# Patient Record
Sex: Female | Born: 1965 | Race: Black or African American | Hispanic: No | Marital: Single | Smoking: Never smoker
Health system: Southern US, Community
[De-identification: ages and names within clinical notes are randomized; demographics above are authoritative.]

## PROBLEM LIST (undated history)

## (undated) DIAGNOSIS — C801 Malignant (primary) neoplasm, unspecified: Secondary | ICD-10-CM

## (undated) DIAGNOSIS — T8859XA Other complications of anesthesia, initial encounter: Secondary | ICD-10-CM

## (undated) DIAGNOSIS — E785 Hyperlipidemia, unspecified: Secondary | ICD-10-CM

## (undated) DIAGNOSIS — K589 Irritable bowel syndrome without diarrhea: Secondary | ICD-10-CM

## (undated) DIAGNOSIS — R0989 Other specified symptoms and signs involving the circulatory and respiratory systems: Secondary | ICD-10-CM

## (undated) DIAGNOSIS — I10 Essential (primary) hypertension: Secondary | ICD-10-CM

## (undated) DIAGNOSIS — N189 Chronic kidney disease, unspecified: Secondary | ICD-10-CM

## (undated) DIAGNOSIS — T4145XA Adverse effect of unspecified anesthetic, initial encounter: Secondary | ICD-10-CM

## (undated) DIAGNOSIS — Z9889 Other specified postprocedural states: Secondary | ICD-10-CM

## (undated) DIAGNOSIS — L509 Urticaria, unspecified: Secondary | ICD-10-CM

## (undated) DIAGNOSIS — R112 Nausea with vomiting, unspecified: Secondary | ICD-10-CM

## (undated) DIAGNOSIS — Z973 Presence of spectacles and contact lenses: Secondary | ICD-10-CM

## (undated) HISTORY — DX: Presence of spectacles and contact lenses: Z97.3

## (undated) HISTORY — DX: Urticaria, unspecified: L50.9

## (undated) HISTORY — DX: Hyperlipidemia, unspecified: E78.5

## (undated) HISTORY — DX: Essential (primary) hypertension: I10

## (undated) HISTORY — DX: Morbid (severe) obesity due to excess calories: E66.01

## (undated) HISTORY — PX: ABDOMINAL HYSTERECTOMY: SHX81

## (undated) HISTORY — PX: HEMORROIDECTOMY: SUR656

## (undated) HISTORY — DX: Irritable bowel syndrome, unspecified: K58.9

## (undated) HISTORY — DX: Other specified symptoms and signs involving the circulatory and respiratory systems: R09.89

---

## 1988-03-05 HISTORY — PX: CHOLECYSTECTOMY: SHX55

## 1997-09-02 ENCOUNTER — Encounter: Admission: RE | Admit: 1997-09-02 | Discharge: 1997-09-02 | Payer: Self-pay | Admitting: Family Medicine

## 1997-09-17 ENCOUNTER — Encounter: Admission: RE | Admit: 1997-09-17 | Discharge: 1997-09-17 | Payer: Self-pay | Admitting: Family Medicine

## 1997-11-09 ENCOUNTER — Encounter: Admission: RE | Admit: 1997-11-09 | Discharge: 1997-11-09 | Payer: Self-pay | Admitting: Sports Medicine

## 1998-05-27 ENCOUNTER — Encounter: Admission: RE | Admit: 1998-05-27 | Discharge: 1998-05-27 | Payer: Self-pay | Admitting: Family Medicine

## 1998-09-26 ENCOUNTER — Emergency Department (HOSPITAL_COMMUNITY): Admission: EM | Admit: 1998-09-26 | Discharge: 1998-09-26 | Payer: Self-pay | Admitting: Emergency Medicine

## 1998-10-05 ENCOUNTER — Encounter: Admission: RE | Admit: 1998-10-05 | Discharge: 1998-10-05 | Payer: Self-pay | Admitting: Family Medicine

## 1998-11-02 ENCOUNTER — Encounter: Admission: RE | Admit: 1998-11-02 | Discharge: 1998-11-02 | Payer: Self-pay | Admitting: Family Medicine

## 1998-12-02 ENCOUNTER — Encounter: Admission: RE | Admit: 1998-12-02 | Discharge: 1998-12-02 | Payer: Self-pay | Admitting: Family Medicine

## 1998-12-22 ENCOUNTER — Encounter: Payer: Self-pay | Admitting: Surgery

## 1998-12-22 ENCOUNTER — Encounter: Admission: RE | Admit: 1998-12-22 | Discharge: 1998-12-22 | Payer: Self-pay | Admitting: Surgery

## 1999-01-19 ENCOUNTER — Encounter: Admission: RE | Admit: 1999-01-19 | Discharge: 1999-01-19 | Payer: Self-pay | Admitting: Family Medicine

## 1999-02-09 ENCOUNTER — Encounter: Admission: RE | Admit: 1999-02-09 | Discharge: 1999-02-09 | Payer: Self-pay | Admitting: Family Medicine

## 1999-03-20 ENCOUNTER — Encounter: Admission: RE | Admit: 1999-03-20 | Discharge: 1999-03-20 | Payer: Self-pay | Admitting: Family Medicine

## 1999-03-23 ENCOUNTER — Ambulatory Visit (HOSPITAL_COMMUNITY): Admission: RE | Admit: 1999-03-23 | Discharge: 1999-03-23 | Payer: Self-pay | Admitting: Family Medicine

## 1999-03-23 ENCOUNTER — Encounter: Admission: RE | Admit: 1999-03-23 | Discharge: 1999-03-23 | Payer: Self-pay | Admitting: Family Medicine

## 1999-04-07 ENCOUNTER — Encounter: Admission: RE | Admit: 1999-04-07 | Discharge: 1999-04-07 | Payer: Self-pay | Admitting: Family Medicine

## 1999-09-25 ENCOUNTER — Encounter: Admission: RE | Admit: 1999-09-25 | Discharge: 1999-09-25 | Payer: Self-pay | Admitting: Family Medicine

## 1999-09-28 ENCOUNTER — Encounter: Admission: RE | Admit: 1999-09-28 | Discharge: 1999-09-28 | Payer: Self-pay | Admitting: Family Medicine

## 2000-05-15 ENCOUNTER — Encounter: Admission: RE | Admit: 2000-05-15 | Discharge: 2000-05-15 | Payer: Self-pay | Admitting: Family Medicine

## 2000-09-02 ENCOUNTER — Encounter: Admission: RE | Admit: 2000-09-02 | Discharge: 2000-09-02 | Payer: Self-pay | Admitting: Family Medicine

## 2000-10-16 ENCOUNTER — Other Ambulatory Visit: Admission: RE | Admit: 2000-10-16 | Discharge: 2000-10-16 | Payer: Self-pay | Admitting: Gastroenterology

## 2000-10-16 ENCOUNTER — Encounter (INDEPENDENT_AMBULATORY_CARE_PROVIDER_SITE_OTHER): Payer: Self-pay | Admitting: Specialist

## 2001-02-07 ENCOUNTER — Encounter: Admission: RE | Admit: 2001-02-07 | Discharge: 2001-02-07 | Payer: Self-pay | Admitting: Family Medicine

## 2001-09-15 ENCOUNTER — Encounter: Admission: RE | Admit: 2001-09-15 | Discharge: 2001-09-15 | Payer: Self-pay | Admitting: Family Medicine

## 2001-09-19 ENCOUNTER — Encounter: Admission: RE | Admit: 2001-09-19 | Discharge: 2001-09-19 | Payer: Self-pay | Admitting: Family Medicine

## 2001-12-15 ENCOUNTER — Encounter: Admission: RE | Admit: 2001-12-15 | Discharge: 2001-12-15 | Payer: Self-pay | Admitting: Family Medicine

## 2002-04-17 ENCOUNTER — Encounter: Admission: RE | Admit: 2002-04-17 | Discharge: 2002-04-17 | Payer: Self-pay | Admitting: Family Medicine

## 2002-10-23 ENCOUNTER — Encounter: Admission: RE | Admit: 2002-10-23 | Discharge: 2002-10-23 | Payer: Self-pay | Admitting: Family Medicine

## 2003-08-23 ENCOUNTER — Emergency Department (HOSPITAL_COMMUNITY): Admission: EM | Admit: 2003-08-23 | Discharge: 2003-08-23 | Payer: Self-pay | Admitting: *Deleted

## 2003-10-04 ENCOUNTER — Encounter: Admission: RE | Admit: 2003-10-04 | Discharge: 2003-10-04 | Payer: Self-pay | Admitting: Sports Medicine

## 2003-10-06 ENCOUNTER — Encounter: Admission: RE | Admit: 2003-10-06 | Discharge: 2003-10-06 | Payer: Self-pay | Admitting: Sports Medicine

## 2003-11-16 ENCOUNTER — Ambulatory Visit: Payer: Self-pay | Admitting: Family Medicine

## 2004-02-10 ENCOUNTER — Emergency Department (HOSPITAL_COMMUNITY): Admission: EM | Admit: 2004-02-10 | Discharge: 2004-02-10 | Payer: Self-pay | Admitting: Family Medicine

## 2004-10-06 ENCOUNTER — Encounter: Admission: RE | Admit: 2004-10-06 | Discharge: 2004-10-06 | Payer: Self-pay | Admitting: Sports Medicine

## 2004-11-03 ENCOUNTER — Ambulatory Visit: Payer: Self-pay | Admitting: Sports Medicine

## 2004-11-17 ENCOUNTER — Ambulatory Visit: Payer: Self-pay | Admitting: Family Medicine

## 2004-12-03 ENCOUNTER — Encounter (INDEPENDENT_AMBULATORY_CARE_PROVIDER_SITE_OTHER): Payer: Self-pay | Admitting: *Deleted

## 2004-12-03 LAB — CONVERTED CEMR LAB

## 2004-12-06 ENCOUNTER — Ambulatory Visit: Payer: Self-pay | Admitting: Family Medicine

## 2004-12-06 ENCOUNTER — Encounter (INDEPENDENT_AMBULATORY_CARE_PROVIDER_SITE_OTHER): Payer: Self-pay | Admitting: *Deleted

## 2004-12-07 ENCOUNTER — Encounter: Admission: RE | Admit: 2004-12-07 | Discharge: 2004-12-07 | Payer: Self-pay | Admitting: Sports Medicine

## 2005-03-05 DIAGNOSIS — C801 Malignant (primary) neoplasm, unspecified: Secondary | ICD-10-CM

## 2005-03-05 HISTORY — DX: Malignant (primary) neoplasm, unspecified: C80.1

## 2005-03-26 ENCOUNTER — Ambulatory Visit: Payer: Self-pay | Admitting: Sports Medicine

## 2005-03-31 ENCOUNTER — Encounter: Admission: RE | Admit: 2005-03-31 | Discharge: 2005-03-31 | Payer: Self-pay | Admitting: Sports Medicine

## 2005-04-13 ENCOUNTER — Ambulatory Visit: Payer: Self-pay | Admitting: Family Medicine

## 2005-04-19 ENCOUNTER — Ambulatory Visit (HOSPITAL_COMMUNITY): Admission: RE | Admit: 2005-04-19 | Discharge: 2005-04-19 | Payer: Self-pay | Admitting: Sports Medicine

## 2005-07-18 ENCOUNTER — Ambulatory Visit: Payer: Self-pay | Admitting: Obstetrics & Gynecology

## 2005-08-30 ENCOUNTER — Emergency Department (HOSPITAL_COMMUNITY): Admission: EM | Admit: 2005-08-30 | Discharge: 2005-08-30 | Payer: Self-pay | Admitting: Family Medicine

## 2005-10-08 ENCOUNTER — Encounter: Admission: RE | Admit: 2005-10-08 | Discharge: 2005-10-08 | Payer: Self-pay | Admitting: Sports Medicine

## 2005-10-09 ENCOUNTER — Encounter (INDEPENDENT_AMBULATORY_CARE_PROVIDER_SITE_OTHER): Payer: Self-pay | Admitting: *Deleted

## 2005-10-09 ENCOUNTER — Inpatient Hospital Stay (HOSPITAL_COMMUNITY): Admission: RE | Admit: 2005-10-09 | Discharge: 2005-10-11 | Payer: Self-pay | Admitting: Obstetrics & Gynecology

## 2005-10-09 ENCOUNTER — Ambulatory Visit: Payer: Self-pay | Admitting: Obstetrics & Gynecology

## 2005-10-14 ENCOUNTER — Inpatient Hospital Stay (HOSPITAL_COMMUNITY): Admission: AD | Admit: 2005-10-14 | Discharge: 2005-10-14 | Payer: Self-pay | Admitting: Obstetrics and Gynecology

## 2005-10-25 ENCOUNTER — Ambulatory Visit: Payer: Self-pay | Admitting: Obstetrics and Gynecology

## 2005-11-08 ENCOUNTER — Ambulatory Visit: Payer: Self-pay | Admitting: Obstetrics & Gynecology

## 2005-11-28 ENCOUNTER — Ambulatory Visit: Payer: Self-pay | Admitting: Obstetrics & Gynecology

## 2005-11-28 ENCOUNTER — Inpatient Hospital Stay (HOSPITAL_COMMUNITY): Admission: AD | Admit: 2005-11-28 | Discharge: 2005-11-28 | Payer: Self-pay | Admitting: Obstetrics & Gynecology

## 2005-12-04 ENCOUNTER — Ambulatory Visit: Admission: RE | Admit: 2005-12-04 | Discharge: 2005-12-04 | Payer: Self-pay | Admitting: Gynecologic Oncology

## 2005-12-05 ENCOUNTER — Ambulatory Visit: Payer: Self-pay | Admitting: Obstetrics & Gynecology

## 2005-12-18 ENCOUNTER — Ambulatory Visit: Payer: Self-pay | Admitting: Gastroenterology

## 2005-12-18 LAB — CONVERTED CEMR LAB
AST: 19 units/L (ref 0–37)
BUN: 6 mg/dL (ref 6–23)
CO2: 29 meq/L (ref 19–32)
Calcium: 9 mg/dL (ref 8.4–10.5)
Chloride: 102 meq/L (ref 96–112)
Creatinine, Ser: 0.7 mg/dL (ref 0.4–1.2)
Eosinophil percent: 0.6 % (ref 0.0–5.0)
Folate: 8.2 ng/mL
Glomerular Filtration Rate, Af Am: 119 mL/min/{1.73_m2}
HCT: 34.4 % — ABNORMAL LOW (ref 36.0–46.0)
Hemoglobin: 11.6 g/dL — ABNORMAL LOW (ref 12.0–15.0)
Iron: 41 ug/dL — ABNORMAL LOW (ref 42–145)
MCV: 84.2 fL (ref 78.0–100.0)
Monocytes Absolute: 0.5 10*3/uL (ref 0.2–0.7)
Sodium: 139 meq/L (ref 135–145)
TSH: 1.39 microintl units/mL (ref 0.35–5.50)
Total Protein: 7.1 g/dL (ref 6.0–8.3)
Vitamin B-12: 289 pg/mL (ref 211–911)
WBC: 8.8 10*3/uL (ref 4.5–10.5)

## 2006-01-04 ENCOUNTER — Ambulatory Visit: Payer: Self-pay | Admitting: Gastroenterology

## 2006-01-04 LAB — CONVERTED CEMR LAB: Potassium: 3.2 meq/L — ABNORMAL LOW (ref 3.5–5.1)

## 2006-02-05 ENCOUNTER — Ambulatory Visit: Payer: Self-pay | Admitting: Gastroenterology

## 2006-02-12 ENCOUNTER — Ambulatory Visit: Payer: Self-pay | Admitting: Obstetrics & Gynecology

## 2006-02-12 ENCOUNTER — Encounter (INDEPENDENT_AMBULATORY_CARE_PROVIDER_SITE_OTHER): Payer: Self-pay | Admitting: *Deleted

## 2006-02-12 ENCOUNTER — Inpatient Hospital Stay (HOSPITAL_COMMUNITY): Admission: RE | Admit: 2006-02-12 | Discharge: 2006-02-14 | Payer: Self-pay | Admitting: Obstetrics & Gynecology

## 2006-02-18 ENCOUNTER — Ambulatory Visit: Payer: Self-pay | Admitting: *Deleted

## 2006-03-07 ENCOUNTER — Ambulatory Visit: Payer: Self-pay | Admitting: Obstetrics and Gynecology

## 2006-03-20 ENCOUNTER — Ambulatory Visit: Payer: Self-pay | Admitting: Obstetrics & Gynecology

## 2006-04-01 ENCOUNTER — Ambulatory Visit: Payer: Self-pay | Admitting: Family Medicine

## 2006-04-03 ENCOUNTER — Ambulatory Visit: Payer: Self-pay | Admitting: Family Medicine

## 2006-04-04 ENCOUNTER — Encounter: Admission: RE | Admit: 2006-04-04 | Discharge: 2006-04-04 | Payer: Self-pay | Admitting: Sports Medicine

## 2006-04-17 ENCOUNTER — Ambulatory Visit: Payer: Self-pay | Admitting: Sports Medicine

## 2006-05-02 DIAGNOSIS — I10 Essential (primary) hypertension: Secondary | ICD-10-CM | POA: Insufficient documentation

## 2006-05-02 DIAGNOSIS — E669 Obesity, unspecified: Secondary | ICD-10-CM | POA: Insufficient documentation

## 2006-05-03 ENCOUNTER — Encounter (INDEPENDENT_AMBULATORY_CARE_PROVIDER_SITE_OTHER): Payer: Self-pay | Admitting: *Deleted

## 2006-05-21 ENCOUNTER — Telehealth (INDEPENDENT_AMBULATORY_CARE_PROVIDER_SITE_OTHER): Payer: Self-pay | Admitting: *Deleted

## 2006-05-29 ENCOUNTER — Ambulatory Visit: Payer: Self-pay | Admitting: Sports Medicine

## 2006-06-14 ENCOUNTER — Encounter: Admission: RE | Admit: 2006-06-14 | Discharge: 2006-07-08 | Payer: Self-pay | Admitting: Sports Medicine

## 2006-08-02 ENCOUNTER — Encounter (INDEPENDENT_AMBULATORY_CARE_PROVIDER_SITE_OTHER): Payer: Self-pay | Admitting: Family Medicine

## 2006-08-14 ENCOUNTER — Ambulatory Visit: Payer: Self-pay | Admitting: Sports Medicine

## 2006-08-19 ENCOUNTER — Encounter: Admission: RE | Admit: 2006-08-19 | Discharge: 2006-08-19 | Payer: Self-pay | Admitting: Sports Medicine

## 2006-08-21 ENCOUNTER — Encounter (INDEPENDENT_AMBULATORY_CARE_PROVIDER_SITE_OTHER): Payer: Self-pay | Admitting: Sports Medicine

## 2006-08-21 ENCOUNTER — Telehealth (INDEPENDENT_AMBULATORY_CARE_PROVIDER_SITE_OTHER): Payer: Self-pay | Admitting: Sports Medicine

## 2006-08-22 ENCOUNTER — Encounter (INDEPENDENT_AMBULATORY_CARE_PROVIDER_SITE_OTHER): Payer: Self-pay | Admitting: *Deleted

## 2006-09-04 ENCOUNTER — Ambulatory Visit: Payer: Self-pay | Admitting: Obstetrics & Gynecology

## 2006-10-11 ENCOUNTER — Encounter: Admission: RE | Admit: 2006-10-11 | Discharge: 2006-10-11 | Payer: Self-pay | Admitting: Sports Medicine

## 2006-10-11 ENCOUNTER — Encounter (INDEPENDENT_AMBULATORY_CARE_PROVIDER_SITE_OTHER): Payer: Self-pay | Admitting: Family Medicine

## 2006-10-14 ENCOUNTER — Telehealth: Payer: Self-pay | Admitting: *Deleted

## 2006-10-29 ENCOUNTER — Telehealth (INDEPENDENT_AMBULATORY_CARE_PROVIDER_SITE_OTHER): Payer: Self-pay | Admitting: *Deleted

## 2006-11-17 ENCOUNTER — Emergency Department (HOSPITAL_COMMUNITY): Admission: EM | Admit: 2006-11-17 | Discharge: 2006-11-18 | Payer: Self-pay | Admitting: Emergency Medicine

## 2006-11-17 ENCOUNTER — Telehealth (INDEPENDENT_AMBULATORY_CARE_PROVIDER_SITE_OTHER): Payer: Self-pay | Admitting: Family Medicine

## 2006-11-19 ENCOUNTER — Telehealth: Payer: Self-pay | Admitting: *Deleted

## 2006-11-21 ENCOUNTER — Encounter (INDEPENDENT_AMBULATORY_CARE_PROVIDER_SITE_OTHER): Payer: Self-pay | Admitting: *Deleted

## 2006-11-21 ENCOUNTER — Encounter (INDEPENDENT_AMBULATORY_CARE_PROVIDER_SITE_OTHER): Payer: Self-pay | Admitting: Family Medicine

## 2006-11-21 ENCOUNTER — Ambulatory Visit: Payer: Self-pay | Admitting: Sports Medicine

## 2006-11-21 LAB — CONVERTED CEMR LAB

## 2006-11-23 ENCOUNTER — Encounter (INDEPENDENT_AMBULATORY_CARE_PROVIDER_SITE_OTHER): Payer: Self-pay | Admitting: Family Medicine

## 2006-11-26 ENCOUNTER — Telehealth (INDEPENDENT_AMBULATORY_CARE_PROVIDER_SITE_OTHER): Payer: Self-pay | Admitting: Family Medicine

## 2007-02-05 ENCOUNTER — Ambulatory Visit (HOSPITAL_COMMUNITY): Admission: RE | Admit: 2007-02-05 | Discharge: 2007-02-05 | Payer: Self-pay | Admitting: Obstetrics and Gynecology

## 2007-03-04 ENCOUNTER — Ambulatory Visit: Payer: Self-pay | Admitting: Family Medicine

## 2007-04-03 ENCOUNTER — Ambulatory Visit: Payer: Self-pay | Admitting: Gynecology

## 2007-05-29 ENCOUNTER — Encounter: Payer: Self-pay | Admitting: Internal Medicine

## 2007-08-25 ENCOUNTER — Telehealth: Payer: Self-pay | Admitting: *Deleted

## 2007-08-25 ENCOUNTER — Ambulatory Visit: Payer: Self-pay | Admitting: Family Medicine

## 2007-08-29 ENCOUNTER — Ambulatory Visit: Payer: Self-pay | Admitting: Sports Medicine

## 2007-09-11 ENCOUNTER — Telehealth: Payer: Self-pay | Admitting: *Deleted

## 2007-09-11 ENCOUNTER — Ambulatory Visit: Payer: Self-pay | Admitting: Family Medicine

## 2007-10-03 ENCOUNTER — Ambulatory Visit: Payer: Self-pay | Admitting: Obstetrics & Gynecology

## 2007-10-03 ENCOUNTER — Encounter: Payer: Self-pay | Admitting: Obstetrics & Gynecology

## 2007-10-06 LAB — CONVERTED CEMR LAB

## 2007-10-08 ENCOUNTER — Ambulatory Visit: Payer: Self-pay | Admitting: Family Medicine

## 2007-10-08 ENCOUNTER — Encounter (INDEPENDENT_AMBULATORY_CARE_PROVIDER_SITE_OTHER): Payer: Self-pay | Admitting: Family Medicine

## 2007-10-08 DIAGNOSIS — K644 Residual hemorrhoidal skin tags: Secondary | ICD-10-CM | POA: Insufficient documentation

## 2007-10-08 DIAGNOSIS — K5730 Diverticulosis of large intestine without perforation or abscess without bleeding: Secondary | ICD-10-CM | POA: Insufficient documentation

## 2007-10-08 LAB — CONVERTED CEMR LAB
LDL Cholesterol: 154 mg/dL — ABNORMAL HIGH (ref 0–99)
Potassium: 3.9 meq/L (ref 3.5–5.3)
Sodium: 140 meq/L (ref 135–145)
Triglycerides: 56 mg/dL (ref <150)

## 2007-10-09 ENCOUNTER — Encounter (INDEPENDENT_AMBULATORY_CARE_PROVIDER_SITE_OTHER): Payer: Self-pay | Admitting: Family Medicine

## 2007-10-13 ENCOUNTER — Encounter: Admission: RE | Admit: 2007-10-13 | Discharge: 2007-10-13 | Payer: Self-pay | Admitting: Obstetrics & Gynecology

## 2007-12-01 ENCOUNTER — Ambulatory Visit: Payer: Self-pay | Admitting: Family Medicine

## 2007-12-31 ENCOUNTER — Ambulatory Visit: Payer: Self-pay | Admitting: Family Medicine

## 2007-12-31 ENCOUNTER — Encounter (INDEPENDENT_AMBULATORY_CARE_PROVIDER_SITE_OTHER): Payer: Self-pay | Admitting: Family Medicine

## 2008-01-01 LAB — CONVERTED CEMR LAB
Basophils Absolute: 0 10*3/uL (ref 0.0–0.1)
Basophils Relative: 0 % (ref 0–1)
HCT: 38.1 % (ref 36.0–46.0)
Hemoglobin: 12.3 g/dL (ref 12.0–15.0)
MCHC: 32.3 g/dL (ref 30.0–36.0)
MCV: 87.6 fL (ref 78.0–100.0)
Monocytes Absolute: 0.3 10*3/uL (ref 0.1–1.0)
Monocytes Relative: 5 % (ref 3–12)
Neutrophils Relative %: 49 % (ref 43–77)
Platelets: 287 10*3/uL (ref 150–400)
TSH: 1.55 microintl units/mL (ref 0.350–4.50)
WBC: 5.9 10*3/uL (ref 4.0–10.5)

## 2008-01-02 ENCOUNTER — Telehealth (INDEPENDENT_AMBULATORY_CARE_PROVIDER_SITE_OTHER): Payer: Self-pay | Admitting: *Deleted

## 2008-01-04 ENCOUNTER — Ambulatory Visit (HOSPITAL_BASED_OUTPATIENT_CLINIC_OR_DEPARTMENT_OTHER): Admission: RE | Admit: 2008-01-04 | Discharge: 2008-01-04 | Payer: Self-pay | Admitting: Family Medicine

## 2008-01-05 ENCOUNTER — Telehealth: Payer: Self-pay | Admitting: *Deleted

## 2008-01-06 ENCOUNTER — Ambulatory Visit: Payer: Self-pay | Admitting: Family Medicine

## 2008-01-06 ENCOUNTER — Encounter (INDEPENDENT_AMBULATORY_CARE_PROVIDER_SITE_OTHER): Payer: Self-pay | Admitting: Family Medicine

## 2008-01-08 ENCOUNTER — Telehealth (INDEPENDENT_AMBULATORY_CARE_PROVIDER_SITE_OTHER): Payer: Self-pay | Admitting: *Deleted

## 2008-01-10 ENCOUNTER — Ambulatory Visit: Payer: Self-pay | Admitting: Internal Medicine

## 2008-01-12 ENCOUNTER — Telehealth (INDEPENDENT_AMBULATORY_CARE_PROVIDER_SITE_OTHER): Payer: Self-pay | Admitting: *Deleted

## 2008-01-12 ENCOUNTER — Ambulatory Visit: Payer: Self-pay | Admitting: Family Medicine

## 2008-01-14 ENCOUNTER — Encounter (INDEPENDENT_AMBULATORY_CARE_PROVIDER_SITE_OTHER): Payer: Self-pay | Admitting: *Deleted

## 2008-01-19 ENCOUNTER — Encounter (INDEPENDENT_AMBULATORY_CARE_PROVIDER_SITE_OTHER): Payer: Self-pay | Admitting: Family Medicine

## 2008-01-21 ENCOUNTER — Telehealth (INDEPENDENT_AMBULATORY_CARE_PROVIDER_SITE_OTHER): Payer: Self-pay | Admitting: *Deleted

## 2008-01-22 ENCOUNTER — Encounter (INDEPENDENT_AMBULATORY_CARE_PROVIDER_SITE_OTHER): Payer: Self-pay | Admitting: *Deleted

## 2008-02-06 ENCOUNTER — Ambulatory Visit (HOSPITAL_COMMUNITY): Admission: RE | Admit: 2008-02-06 | Discharge: 2008-02-06 | Payer: Self-pay | Admitting: Obstetrics & Gynecology

## 2008-03-03 ENCOUNTER — Telehealth: Payer: Self-pay | Admitting: *Deleted

## 2008-03-05 HISTORY — PX: GASTRIC RESTRICTION SURGERY: SHX653

## 2008-03-08 ENCOUNTER — Ambulatory Visit: Payer: Self-pay | Admitting: Family Medicine

## 2008-04-02 ENCOUNTER — Ambulatory Visit: Payer: Self-pay | Admitting: Family Medicine

## 2008-05-17 ENCOUNTER — Encounter: Payer: Self-pay | Admitting: *Deleted

## 2008-05-18 ENCOUNTER — Encounter (INDEPENDENT_AMBULATORY_CARE_PROVIDER_SITE_OTHER): Payer: Self-pay | Admitting: Family Medicine

## 2008-06-07 ENCOUNTER — Ambulatory Visit: Payer: Self-pay | Admitting: Family Medicine

## 2008-06-07 ENCOUNTER — Encounter (INDEPENDENT_AMBULATORY_CARE_PROVIDER_SITE_OTHER): Payer: Self-pay | Admitting: Family Medicine

## 2008-06-07 LAB — CONVERTED CEMR LAB
AST: 18 units/L (ref 0–37)
Alkaline Phosphatase: 90 units/L (ref 39–117)
Glucose, Bld: 78 mg/dL (ref 70–99)
MCHC: 32.1 g/dL (ref 30.0–36.0)
MCV: 85.1 fL (ref 78.0–100.0)
RBC: 4.43 M/uL (ref 3.87–5.11)
RDW: 13.8 % (ref 11.5–15.5)
Sodium: 142 meq/L (ref 135–145)
Total Bilirubin: 0.6 mg/dL (ref 0.3–1.2)
Total Protein: 6.7 g/dL (ref 6.0–8.3)

## 2008-06-08 ENCOUNTER — Telehealth (INDEPENDENT_AMBULATORY_CARE_PROVIDER_SITE_OTHER): Payer: Self-pay | Admitting: Family Medicine

## 2008-07-29 ENCOUNTER — Encounter (INDEPENDENT_AMBULATORY_CARE_PROVIDER_SITE_OTHER): Payer: Self-pay | Admitting: Family Medicine

## 2008-08-03 ENCOUNTER — Ambulatory Visit (HOSPITAL_COMMUNITY): Admission: RE | Admit: 2008-08-03 | Discharge: 2008-08-03 | Payer: Self-pay | Admitting: Surgery

## 2008-08-04 ENCOUNTER — Encounter: Admission: RE | Admit: 2008-08-04 | Discharge: 2008-08-04 | Payer: Self-pay | Admitting: Surgery

## 2008-08-05 ENCOUNTER — Ambulatory Visit (HOSPITAL_COMMUNITY): Admission: RE | Admit: 2008-08-05 | Discharge: 2008-08-05 | Payer: Self-pay | Admitting: Surgery

## 2008-08-24 ENCOUNTER — Encounter (INDEPENDENT_AMBULATORY_CARE_PROVIDER_SITE_OTHER): Payer: Self-pay | Admitting: Family Medicine

## 2008-09-20 ENCOUNTER — Ambulatory Visit (HOSPITAL_COMMUNITY): Admission: RE | Admit: 2008-09-20 | Discharge: 2008-09-20 | Payer: Self-pay | Admitting: Surgery

## 2008-10-13 ENCOUNTER — Encounter: Admission: RE | Admit: 2008-10-13 | Discharge: 2008-10-13 | Payer: Self-pay | Admitting: Internal Medicine

## 2008-10-13 ENCOUNTER — Encounter: Payer: Self-pay | Admitting: Family Medicine

## 2008-10-20 ENCOUNTER — Telehealth: Payer: Self-pay | Admitting: Family Medicine

## 2008-11-01 ENCOUNTER — Ambulatory Visit (HOSPITAL_COMMUNITY): Admission: RE | Admit: 2008-11-01 | Discharge: 2008-11-01 | Payer: Self-pay | Admitting: Surgery

## 2008-11-05 ENCOUNTER — Ambulatory Visit: Payer: Self-pay | Admitting: Obstetrics & Gynecology

## 2008-11-19 ENCOUNTER — Ambulatory Visit (HOSPITAL_COMMUNITY): Admission: RE | Admit: 2008-11-19 | Discharge: 2008-11-19 | Payer: Self-pay | Admitting: Surgery

## 2008-11-22 ENCOUNTER — Telehealth: Payer: Self-pay | Admitting: Family Medicine

## 2008-12-02 ENCOUNTER — Encounter: Admission: RE | Admit: 2008-12-02 | Discharge: 2009-03-02 | Payer: Self-pay | Admitting: Surgery

## 2008-12-15 ENCOUNTER — Encounter: Payer: Self-pay | Admitting: Family Medicine

## 2008-12-20 ENCOUNTER — Ambulatory Visit (HOSPITAL_COMMUNITY): Admission: RE | Admit: 2008-12-20 | Discharge: 2008-12-21 | Payer: Self-pay | Admitting: Surgery

## 2008-12-27 ENCOUNTER — Emergency Department (HOSPITAL_COMMUNITY): Admission: EM | Admit: 2008-12-27 | Discharge: 2008-12-27 | Payer: Self-pay | Admitting: Emergency Medicine

## 2009-01-05 ENCOUNTER — Encounter: Payer: Self-pay | Admitting: Family Medicine

## 2009-02-07 ENCOUNTER — Ambulatory Visit (HOSPITAL_COMMUNITY): Admission: RE | Admit: 2009-02-07 | Discharge: 2009-02-07 | Payer: Self-pay | Admitting: Obstetrics & Gynecology

## 2009-02-07 ENCOUNTER — Ambulatory Visit: Payer: Self-pay | Admitting: Family Medicine

## 2009-03-09 ENCOUNTER — Encounter: Payer: Self-pay | Admitting: Family Medicine

## 2009-03-31 ENCOUNTER — Encounter: Admission: RE | Admit: 2009-03-31 | Discharge: 2009-06-29 | Payer: Self-pay | Admitting: Surgery

## 2009-04-15 ENCOUNTER — Encounter: Payer: Self-pay | Admitting: Family Medicine

## 2009-04-21 ENCOUNTER — Telehealth: Payer: Self-pay | Admitting: Family Medicine

## 2009-04-21 ENCOUNTER — Ambulatory Visit: Payer: Self-pay | Admitting: Family Medicine

## 2009-04-25 ENCOUNTER — Telehealth: Payer: Self-pay | Admitting: *Deleted

## 2009-05-19 ENCOUNTER — Telehealth: Payer: Self-pay | Admitting: Family Medicine

## 2009-05-24 ENCOUNTER — Ambulatory Visit: Payer: Self-pay | Admitting: Family Medicine

## 2009-05-24 DIAGNOSIS — N952 Postmenopausal atrophic vaginitis: Secondary | ICD-10-CM | POA: Insufficient documentation

## 2009-05-24 LAB — CONVERTED CEMR LAB
Glucose, Urine, Semiquant: NEGATIVE
Protein, U semiquant: NEGATIVE
Specific Gravity, Urine: >=1.03
Urobilinogen, UA: 0.2

## 2009-07-06 ENCOUNTER — Encounter: Payer: Self-pay | Admitting: Family Medicine

## 2009-07-28 ENCOUNTER — Encounter: Admission: RE | Admit: 2009-07-28 | Discharge: 2009-10-26 | Payer: Self-pay | Admitting: Surgery

## 2009-09-06 ENCOUNTER — Ambulatory Visit: Payer: Self-pay | Admitting: Family Medicine

## 2009-09-06 DIAGNOSIS — K5909 Other constipation: Secondary | ICD-10-CM | POA: Insufficient documentation

## 2009-10-21 ENCOUNTER — Encounter: Admission: RE | Admit: 2009-10-21 | Discharge: 2009-10-21 | Payer: Self-pay | Admitting: Family Medicine

## 2009-12-05 ENCOUNTER — Telehealth: Payer: Self-pay | Admitting: Family Medicine

## 2009-12-05 DIAGNOSIS — Z8543 Personal history of malignant neoplasm of ovary: Secondary | ICD-10-CM | POA: Insufficient documentation

## 2009-12-07 ENCOUNTER — Ambulatory Visit: Payer: Self-pay | Admitting: Family Medicine

## 2009-12-07 ENCOUNTER — Encounter: Payer: Self-pay | Admitting: Family Medicine

## 2009-12-07 LAB — CONVERTED CEMR LAB
ALT: 23 units/L (ref 0–35)
AST: 24 units/L (ref 0–37)
Albumin: 4.3 g/dL (ref 3.5–5.2)
Alkaline Phosphatase: 69 units/L (ref 39–117)
BUN: 18 mg/dL (ref 6–23)
CO2: 34 meq/L — ABNORMAL HIGH (ref 19–32)
Calcium: 10.1 mg/dL (ref 8.4–10.5)
Chloride: 99 meq/L (ref 96–112)
Cholesterol: 237 mg/dL — ABNORMAL HIGH (ref 0–200)
Creatinine, Ser: 0.62 mg/dL (ref 0.40–1.20)
Glucose, Bld: 86 mg/dL (ref 70–99)
HDL: 73 mg/dL (ref >39)
LDL Cholesterol: 154 mg/dL — ABNORMAL HIGH (ref 0–99)
Potassium: 3.8 meq/L (ref 3.5–5.3)
Sodium: 141 meq/L (ref 135–145)
Total Bilirubin: 0.6 mg/dL (ref 0.3–1.2)
Total CHOL/HDL Ratio: 3.2
Total Protein: 7.1 g/dL (ref 6.0–8.3)
Triglycerides: 52 mg/dL (ref <150)
VLDL: 10 mg/dL (ref 0–40)

## 2009-12-09 ENCOUNTER — Encounter: Payer: Self-pay | Admitting: Family Medicine

## 2010-01-03 ENCOUNTER — Ambulatory Visit: Payer: Self-pay | Admitting: Family Medicine

## 2010-02-08 ENCOUNTER — Telehealth: Payer: Self-pay | Admitting: *Deleted

## 2010-02-09 ENCOUNTER — Encounter: Admit: 2010-02-09 | Payer: Self-pay | Admitting: Surgery

## 2010-02-13 ENCOUNTER — Telehealth: Payer: Self-pay | Admitting: *Deleted

## 2010-02-15 ENCOUNTER — Encounter: Payer: Self-pay | Admitting: Family Medicine

## 2010-02-15 ENCOUNTER — Ambulatory Visit (HOSPITAL_COMMUNITY)
Admission: RE | Admit: 2010-02-15 | Discharge: 2010-02-15 | Payer: Self-pay | Source: Home / Self Care | Attending: Family Medicine | Admitting: Family Medicine

## 2010-03-26 ENCOUNTER — Encounter: Payer: Self-pay | Admitting: Family Medicine

## 2010-04-04 NOTE — Assessment & Plan Note (Signed)
Summary: labs/flu shot,df   Nurse Visit   Allergies: 1)  ! Pcn  Immunizations Administered:  Influenza Vaccine # 1:    Vaccine Type: Fluvax Non-MCR    Site: left deltoid    Mfr: [employer]    Dose: 0.5 ml    Route: IM    Given by: Theresia Lo RN    Exp. Date: 08/30/2010    Lot #: [id number]    VIS given: 09/27/09 version given December 07, 2009.  Flu Vaccine Consent Questions:    Do you have a history of severe allergic reactions to this vaccine? no    Any prior history of allergic reactions to egg and/or gelatin? no    Do you have a sensitivity to the preservative Thimersol? no    Do you have a past history of Guillan-Barre Syndrome? no    Do you currently have an acute febrile illness? no    Have you ever had a severe reaction to latex? no    Vaccine information given and explained to patient? yes    Are you currently pregnant? no  Orders Added: 1)  Influenza Vaccine NON MCR [00028] 2)  Admin 1st Vaccine [35361]

## 2010-04-04 NOTE — Assessment & Plan Note (Signed)
Summary: productive cough/[place]   Vital Signs:  Patient profile:   45 year old female Height:      64 inches Weight:      257.1 pounds BMI:     44.29 Temp:     97.9 degrees F oral Pulse rate:   96 / minute BP sitting:   110 / 77  (left arm) Cuff size:   large  Vitals Entered By: Garen Grams LPN (April 21, 2009 2:20 PM) CC: chest congestion/cough x 1 week Is Patient Diabetic? No Pain Assessment Patient in pain? no        Primary Care Provider:  Sylvan Cheese MD  CC:  chest congestion/cough x 1 week.  History of Present Illness: Pt presents with 1 wk of cough with green sputum that is worse at night.  She had a preceding URI that got better and now she has a cough again.  no fevers, but has post nasal drip and has a little bit of nose bleeding.  Her chest hurts from the coughing.  she denies any acid reflux or asthma.    Habits & Providers  Alcohol-Tobacco-Diet     Tobacco Status: never  Current Medications (verified): 1)  Hydrochlorothiazide 25 Mg  Tabs (Hydrochlorothiazide) .Marland Kitchen.. 1 Tablet By Mouth Daily 2)  Hydrocod Polst-Chlorphen Polst 10-8 Mg/53ml Lqcr (Chlorpheniramine-Hydrocodone) .... Take 5ml At Bedtime (One Teaspoon) 3)  Tessalon Perles 100 Mg Caps (Benzonatate) .... Take One Tid  Allergies (verified): 1)  ! Pcn  Review of Systems       The patient complains of chest pain and prolonged cough.  The patient denies anorexia, fever, and weight loss.    Physical Exam  General:  Well-developed,well-nourished,in no acute distress; alert,appropriate and cooperative throughout examination Ears:  External ear exam shows no significant lesions or deformities.  Otoscopic examination reveals clear canals, tympanic membranes are intact bilaterally without bulging, retraction, inflammation or discharge. Hearing is grossly normal bilaterally. Nose:  mucosal erythema.   Mouth:  good dentition, pharynx pink and moist, no exudates, and postnasal drip.   Lungs:  Normal  respiratory effort, chest expands symmetrically. Lungs are clear to auscultation, no crackles or wheezes. Heart:  Normal rate and regular rhythm. S1 and S2 normal without gallop, murmur, click, rub or other extra sounds.   Impression & Recommendations:  Problem # 1:  UPPER RESPIRATORY INFECTION (ICD-465.9) Assessment New no fevers, chest pain is due to cough, not pleuritic in nature.   believe cough is residual URI, no evidence of PNA or other more serious concern.  gave tessalon perles to use during day and cough syrup to help with sleep.  gave red flags of fever, chest pain, worsening symptoms. Her updated medication list for this problem includes:    Hydrocod Polst-chlorphen Polst 10-8 Mg/50ml Lqcr (Chlorpheniramine-hydrocodone) .Marland Kitchen... Take 5ml at bedtime (one teaspoon)    Tessalon Perles 100 Mg Caps (Benzonatate) .Marland Kitchen... Take one tid  Orders: [facility]- Est Level  3 (24401)  Complete Medication List: 1)  Hydrochlorothiazide 25 Mg Tabs (Hydrochlorothiazide) .Marland Kitchen.. 1 tablet by mouth daily 2)  Hydrocod Polst-chlorphen Polst 10-8 Mg/33ml Lqcr (Chlorpheniramine-hydrocodone) .... Take 5ml at bedtime (one teaspoon) 3)  Tessalon Perles 100 Mg Caps (Benzonatate) .... Take one tid Prescriptions: TESSALON PERLES 100 MG CAPS (BENZONATATE) take one TID  #20 x 0   Entered and Authorized by:   Ellery Plunk MD   Signed by:   Ellery Plunk MD on 04/21/2009   Method used:   Print then Give to Patient  RxID:   [id number] HYDROCOD POLST-CHLORPHEN POLST 10-8 MG/5ML LQCR (CHLORPHENIRAMINE-HYDROCODONE) take 5ml at bedtime (one teaspoon)  #1 x 0   Entered and Authorized by:   Ellery Plunk MD   Signed by:   Ellery Plunk MD on 04/21/2009   Method used:   Print then Give to Patient   RxID:   [id number]

## 2010-04-04 NOTE — Progress Notes (Signed)
Summary: triage   Phone Note Call from Patient Call back at Work Phone [phone]   Caller: Patient Summary of Call: Pt has a little of bleeding after having sexual intercourse with boyfriend.  They do not have intercourse very often. Initial call taken by: Clydell Hakim,  May 19, 2009 4:54 PM  Follow-up for Phone Call        had hysterectomy in 2007.  states she has adequate lubrication. boyfriend is not excessively large. no rough sex. states it is a small amount but happens every time they have sex. she is sure it is from the vagina. she applied pressure with toilet paper to stop the bleeding. started a few months ago. work in with her pcp on monday Follow-up by: Golden Circle RN,  May 19, 2009 4:55 PM

## 2010-04-04 NOTE — Progress Notes (Signed)
Summary: meds prob   Phone Note Call from Patient Call back at Work Phone [phone]   Caller: Patient Summary of Call: states that the cough syrup rx that [facility] forgot to put the amount on the rx -pls advise Initial call taken by: De Nurse,  April 25, 2009 10:30 AM  Follow-up for Phone Call        to preceptor as pcp is not in Follow-up by: Golden Circle RN,  April 25, 2009 11:00 AM  Additional Follow-up for Phone Call Additional follow up Details #1::        per Dr. Jennette Kettle should be 60ml. LM for pt to call & give [place] her pharmacy & it's location Additional Follow-up by: Golden Circle RN,  April 25, 2009 11:05 AM    Additional Follow-up for Phone Call Additional follow up Details #2::    [facility] [address] Follow-up by: De Nurse,  April 25, 2009 12:17 PM  Additional Follow-up for Phone Call Additional follow up Details #3:: Details for Additional Follow-up Action Taken: called it in Additional Follow-up by: Golden Circle RN,  April 25, 2009 1:40 PM

## 2010-04-04 NOTE — Progress Notes (Signed)
Summary: triage   Phone Note Call from Patient Call back at [phone]   Caller: Patient Summary of Call: voice keeps going out and coughing up green mucus  Initial call taken by: De Nurse,  April 21, 2009 9:13 AM  Follow-up for Phone Call        productive cough x 2 wks. voice is coming & going. no fever. has taken different OTC. appt at 2 with Dr. Hulen Luster Follow-up by: Golden Circle RN,  April 21, 2009 9:32 AM

## 2010-04-04 NOTE — Assessment & Plan Note (Signed)
Summary: cpe,tcb   Vital Signs:  Patient profile:   46 year old female Height:      64 inches Weight:      239 pounds BMI:     41.17 Pulse rate:   80 / minute BP sitting:   140 / 88  (left arm) Cuff size:   large  Vitals Entered By: Arlyss Repress CMA, (September 06, 2009 2:05 PM) CC: discuss having pap smears at [facility]. last pap at [facility]. had hysterectomy 2007. needs refills. Is Patient Diabetic? No Pain Assessment Patient in pain? no        Primary Care Provider:  [name] DO  CC:  discuss having pap smears at [facility]. last pap at [facility]. had hysterectomy 2007. needs refills..  History of Present Illness: 45 yo F:  1. HTN: Rx HCTZ. Today 140/88. Checks at home and is usually 120s systolic. No CP, SOB, HA, dizziness. Slight bilateral ankle edema this summe "while running around."  2. Constipation: chronic issue that has worsened lately. Has tried Miralax, Benefiber, Colace without help. Using enemas for BMs. Last BM yesterday. Hx of hemorrhoidectomy.  3. 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 since that time has been recommended for her to have yearly CAT scans. next CT abd/pelvis due 12/11. wants to start getting pelvic exams done at [facility] - will need them done twice yearly.  Habits & Providers  Alcohol-Tobacco-Diet     Tobacco Status: never  Current Medications (verified): 1)  Hydrochlorothiazide 25 Mg  Tabs (Hydrochlorothiazide) .Marland Kitchen.. 1 Tablet By Mouth Daily 2)  Amitiza 24 Mcg Caps (Lubiprostone) .... One By Mouth Bid  Allergies (verified): 1)  ! Pcn PMH-FH-SH reviewed for relevance  Review of Systems General:  Denies chills and fever. CV:  Denies chest pain or discomfort and shortness of breath with exertion. Resp:  Denies cough. GI:  Complains of constipation; denies abdominal pain, dark tarry stools, diarrhea, nausea, and vomiting.  Physical Exam  General:  Well-developed,  well-nourished, in no acute distress; alert, appropriate and cooperative throughout examination. Vitals reviewed. Lungs:  Normal respiratory effort, chest expands symmetrically. Lungs are clear to auscultation, no crackles or wheezes. Heart:  Normal rate and regular rhythm. S1 and S2 normal without gallop, murmur, click, rub or other extra sounds. Abdomen:  Bowel sounds positive,abdomen soft and non-tender without masses, organomegaly or hernias noted. Pulses:  2+ DP. Extremities:  Non-pitting edema at bilateral ankles. Psych:  Oriented X3, memory intact for recent and remote, normally interactive, good eye contact, and not anxious appearing.     Impression & Recommendations:  Problem # 1:  HYPERTENSION, BENIGN SYSTEMIC (ICD-401.1) Assessment Unchanged  Her updated medication list for this problem includes:    Hydrochlorothiazide 25 Mg Tabs (Hydrochlorothiazide) .Marland Kitchen... 1 tablet by mouth daily  Orders: Comp Met-[facility] ([id number]) [facility]- Est  Level 4 (06237)  Problem # 2:  CONSTIPATION, CHRONIC (ICD-564.09) Assessment: Deteriorated  Rx Amitiza. Patient endorses being on this medication previously with good results. Discussed high fiber, more water.  Orders: [facility]- Est  Level 4 (62831)  Problem # 3:  OBESITY, NOS (ICD-278.00) Assessment: Improved  Still continuing to lose weight after lap band.   Orders: Comp Met-[facility] [phone]) Lipid-[facility] (10626-94854) [facility]- Est  Level 4 (62703)  Complete Medication List: 1)  Hydrochlorothiazide 25 Mg Tabs (Hydrochlorothiazide) .Marland Kitchen.. 1 tablet by mouth daily 2)  Amitiza 24 Mcg Caps (Lubiprostone) .... One by mouth bid  Patient  Instructions: 1)  It was nice to meet you today! 2)  We will request records from your OB/GYN. 3)  Take Amitiza for constipation. 4)  Make an appointment to see your eye doctor to evaluate for dry eyes. Prescriptions: AMITIZA 24 MCG CAPS (LUBIPROSTONE) one by mouth BID  #60 x 3   Entered and Authorized by:   [name] DO   Signed by:   [name] DO on 09/06/2009   Method used:   Electronically to        [facility] [address] [phone]* (retail)       [address]       [place], [place]  [address]       Ph: [phone]       Fax: [phone]   RxID:   [id number]   Prevention & Chronic Care Immunizations   Influenza vaccine: Fluvax Non-MCR  (02/07/2009)   Influenza vaccine deferral: Not available  (09/06/2009)   Influenza vaccine due: 03/03/2008    Tetanus booster: 10/04/2003: Done.   Tetanus booster due: 10/03/2013    Pneumococcal vaccine: Not documented  Other Screening   Pap smear: normal- done at [facility].  Pt is s/p TAH/BSO for cancer  (10/06/2007)   Pap smear due: 10/05/2008    Mammogram: normal  (10/11/2006)   Mammogram due: 10/2007   Smoking status: never  (09/06/2009)  Lipids   Total Cholesterol: 236  (10/08/2007)   LDL: 154  (10/08/2007)   LDL Direct: Not documented   HDL: 71  (10/08/2007)   Triglycerides: 56  (10/08/2007)  Hypertension   Last Blood Pressure: 140 / 88  (09/06/2009)   Serum creatinine: 0.55  (06/07/2008)   Serum potassium 3.5  (06/07/2008) CMP ordered     Hypertension flowsheet reviewed?: Yes   Progress toward BP goal: At goal  Self-Management Support :   Personal Goals (by the next clinic visit) :      Personal blood pressure goal: 140/90  (09/06/2009)   Patient will work on the following items until the next clinic visit to reach self-care goals:     Medications and monitoring: take my medicines every day, bring all of my medications to every visit  (09/06/2009)     Eating: drink diet soda or water instead of juice or soda, eat more vegetables, use fresh or frozen vegetables, eat foods that are low in salt, eat baked foods instead of fried foods, eat fruit for snacks and desserts, limit or avoid alcohol  (09/06/2009)     Activity: take a 30 minute walk every day, take the stairs instead of the elevator, park at the far end of the  parking lot  (09/06/2009)    Hypertension self-management support: Written self-care plan  (09/06/2009)   Hypertension self-care plan printed.

## 2010-04-04 NOTE — Letter (Signed)
Summary: Lipid Letter  [facility]  [address]   [place], [place] [address]   Phone: [phone]  Fax: [phone]    12/09/2009  [name] [address] [place], [place]  [address]  Dear [name]:  We have carefully reviewed your last lipid profile from 12/07/2009 and the results are noted below with a summary of recommendations for lipid management.    Cholesterol:       237     Goal: < 199   HDL "good" Cholesterol:   73     Goal: > 39   LDL "bad" Cholesterol:   154     Goal: < 130   Triglycerides:       52     Goal: < 150     TLC Diet (Therapeutic Lifestyle Change): Saturated Fats & Transfatty acids should be kept < 7% of total calories ***Reduce Saturated Fats Polyunstaurated Fat can be up to 10% of total calories Monounsaturated Fat Fat can be up to 20% of total calories Total Fat should be no greater than 25-35% of total calories Carbohydrates should be 50-60% of total calories Protein should be approximately 15% of total calories Fiber should be at least 20-30 grams a day ***Increased fiber may help lower LDL Total Cholesterol should be < 200mg /day Consider adding plant stanol/sterols to diet (example: Benacol spread) ***A higher intake of unsaturated fat may reduce Triglycerides and Increase HDL    Adjunctive Measures (may lower LIPIDS and reduce risk of Heart Attack) include: Aerobic Exercise (20-30 minutes 3-4 times a week) Limit Alcohol Consumption Weight Reduction Aspirin 75-81 mg a day by mouth (if not allergic or contraindicated) Dietary Fiber 20-30 grams a day by mouth    Current Medications: 1)    Hydrochlorothiazide 25 Mg  Tabs (Hydrochlorothiazide) .Marland Kitchen.. 1 tablet by mouth daily 2)    Amitiza 24 Mcg Caps (Lubiprostone) .... One by mouth bid  If you have any questions, please call. We appreciate being able to work with you.   Sincerely,    [name] [name] DO  Appended Document: Lipid  Letter mailed.

## 2010-04-04 NOTE — Assessment & Plan Note (Signed)
Summary: F/U VISIT/[facility]   Vital Signs:  Patient profile:   45 year old female Height:      64 inches Weight:      228 pounds BMI:     39.28 Temp:     97.9 degrees F oral Pulse rate:   90 / minute BP sitting:   110 / 83  (right arm) Cuff size:   large  Vitals Entered By: Tessie Fass CMA (January 03, 2010 4:13 PM) CC: F/U   Primary Care Provider:  [name] DO  CC:  F/U.  History of Present Illness: 45 yo F:  1. HTN: Rx HCTZ. Checks at home and is usually 120s systolic. No CP, SOB, HA, dizziness.   2. GYN: Hx hysterectomy in 2007 2/2 dermoid cyst. 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 Since that time has been recommended for her to have yearly CAT scans. next CT abd/pelvis due 12/11. Wants to start getting pelvic exams done at [facility] - will need them done twice yearly.  3. Weight Loss: s/p lap band. Doing well. Wants B12 injection.  Current Medications (verified): 1)  Hydrochlorothiazide 25 Mg  Tabs (Hydrochlorothiazide) .Marland Kitchen.. 1 Tablet By Mouth Daily 2)  Amitiza 24 Mcg Caps (Lubiprostone) .... One By Mouth Bid  Allergies (verified): 1)  ! Pcn PMH-FH-SH reviewed for relevance  Review of Systems      See HPI  Physical Exam  General:  Well-developed, well-nourished, in no acute distress; alert, appropriate and cooperative throughout examination. Vitals reviewed. Lungs:  Normal respiratory effort, chest expands symmetrically. Lungs are clear to auscultation, no crackles or wheezes. Heart:  Normal rate and regular rhythm. S1 and S2 normal without gallop, murmur, click, rub or other extra sounds. Pulses:  2+ DP. Extremities:  No clubbing, cyanosis, edema, or deformity noted with normal full range of motion of all joints.     Impression & Recommendations:  Problem # 1:  HYPERTENSION, BENIGN SYSTEMIC (ICD-401.1) Assessment Unchanged  Her updated medication list for this problem includes:    Hydrochlorothiazide 25 Mg Tabs  (Hydrochlorothiazide) .Marland Kitchen... 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  Orders: [facility]- Est  Level 4 (60454)  Problem # 3:  OBESITY, NOS (ICD-278.00) Assessment: Improved  Continues to lose weight.  Orders: [facility]- Est  Level 4 (09811)  Complete Medication List: 1)  Hydrochlorothiazide 25 Mg Tabs (Hydrochlorothiazide) .Marland Kitchen.. 1 tablet by mouth daily 2)  Amitiza 24 Mcg Caps (Lubiprostone) .... One by mouth bid  Other Orders: Vit B12 1000 mcg (B1478)   Medication Administration  Injection # 1:    Medication: Vit B12 1000 mcg    Diagnosis: FATIGUE (ICD-780.79)    Route: IM    Site: R deltoid    Exp Date: 07/04/2011    Lot #: [id number]    Mfr: [employer]    Patient tolerated injection without complications    Given by: Arlyss Repress CMA, (January 03, 2010 4:54 PM)  Orders Added: 1)  Vit B12 1000 mcg [J3420] 2)  [facility]- Est  Level 4 [08657]     Medication Administration  Injection # 1:    Medication: Vit B12 1000 mcg    Diagnosis: FATIGUE (ICD-780.79)    Route: IM    Site: R deltoid    Exp Date: 07/04/2011    Lot #: [id number]  Mfr: [employer]    Patient tolerated injection without complications    Given by: Arlyss Repress CMA, (January 03, 2010 4:54 PM)  Orders Added: 1)  Vit B12 1000 mcg [J3420] 2)  [facility]- Est  Level 4 [16109]   Prevention & Chronic Care Immunizations   Influenza vaccine: Fluvax Non-MCR  (12/07/2009)   Influenza vaccine deferral: Not available  (09/06/2009)   Influenza vaccine due: 03/03/2008    Tetanus booster: 10/04/2003: Done.   Tetanus booster due: 10/03/2013    Pneumococcal vaccine: Not documented  Other Screening   Pap smear: normal- done at [facility].  Pt is s/p TAH/BSO for cancer  (10/06/2007)   Pap smear due: 10/05/2008    Mammogram: ASSESSMENT: Negative - BI-RADS 1^MM DIGITAL  SCREENING  (10/21/2009)   Mammogram due: 10/2007   Smoking status: never  (09/06/2009)  Lipids   Total Cholesterol: 237  (12/07/2009)   LDL: 154  (12/07/2009)   LDL Direct: Not documented   HDL: 73  (12/07/2009)   Triglycerides: 52  (12/07/2009)  Hypertension   Last Blood Pressure: 110 / 83  (01/03/2010)   Serum creatinine: 0.62  (12/07/2009)   Serum potassium 3.8  (12/07/2009)    Hypertension flowsheet reviewed?: Yes   Progress toward BP goal: At goal  Self-Management Support :   Personal Goals (by the next clinic visit) :      Personal blood pressure goal: 140/90  (09/06/2009)   Patient will work on the following items until the next clinic visit to reach self-care goals:     Medications and monitoring: take my medicines every day, bring all of my medications to every visit  (01/03/2010)     Eating: drink diet soda or water instead of juice or soda, eat more vegetables, use fresh or frozen vegetables, eat foods that are low in salt, eat baked foods instead of fried foods, eat fruit for snacks and desserts, limit or avoid alcohol  (01/03/2010)     Activity: take a 30 minute walk every day, take the stairs instead of the elevator, park at the far end of the parking lot  (01/03/2010)    Hypertension self-management support: Written self-care plan  (01/03/2010)   Hypertension self-care plan printed.

## 2010-04-04 NOTE — Consult Note (Signed)
Summary: [facility]   Imported By: Clydell Hakim 08/01/2009 08:42:57  _____________________________________________________________________  External Attachment:    Type:   Image     Comment:   External Document

## 2010-04-04 NOTE — Consult Note (Signed)
Summary: [facility]   Imported By: De Nurse 04/08/2009 16:50:43  _____________________________________________________________________  External Attachment:    Type:   Image     Comment:   External Document

## 2010-04-04 NOTE — Progress Notes (Signed)
Summary: referral   Phone Note Call from Patient Call back at Work Phone [phone]   Caller: Patient Summary of Call: wants to go ahead and achedule appt for her CT in December except Dec 9th- (see previous office notes) Initial call taken by: De Nurse,  December 05, 2009 2:06 PM  Follow-up for Phone Call        Seen that office notes said she needs to have a CT scan 12/11. I was wanting to know if it was ok to schedule this and can i get orders for it please Follow-up by: Jimmy Footman, CMA,  December 05, 2009 2:16 PM  Additional Follow-up for Phone Call Additional follow up Details #1::        done! 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 Wanting to know where she has gotten here ct's at before Follow-up by: Jimmy Footman, CMA,  December 06, 2009 2:28 PM  Additional Follow-up for Phone Call Additional follow up Details #3:: Details for Additional Follow-up Action Taken: Spoke with pt. She went to [facility] year and would like to gothere again this year. 12/11 she is due. Cannot do dec 9th. 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 for the CT/ w Suzi Roots, CMA  December 07, 2009 11:48 AM

## 2010-04-04 NOTE — Assessment & Plan Note (Signed)
Summary: bleeding after sex/[place]/   Vital Signs:  Patient profile:   45 year old female Height:      64 inches Weight:      248.2 pounds BMI:     42.76 Temp:     98.1 degrees F oral Pulse rate:   62 / minute BP sitting:   123 / 82  (right arm) Cuff size:   regular  Vitals Entered By: Garen Grams LPN (May 24, 2009 9:23 AM) CC: bleeding after sex Is Patient Diabetic? No Pain Assessment Patient in pain? no        Primary Care Provider:  Sylvan Cheese MD  CC:  bleeding after sex.  History of Present Illness: Patient is here today for c/o spotting after intercourse. She reports bright red blood per vagina, just a few spots on tissue paper when wiping (can occur a few hours or the next day after intercourse).  She has a h/o Struma Ovarii requiring hysterectomy, including removal of both ovaries and cervix in 2007 (2 surgeries, one in Aug and Dec) by Dr. Penne Lash and Dr. Adron Bene. She denies any problems with lubrication.  Intercourse with her boyfriend occurs approx once every month, sometimes q 5 months.   Exam reveals moderate vaginal atrophy, labia with no lesions, abrasions or lacerations.  Urethra normal in appearance, no significant cystocele or rectocele. Vaginal cuff intact.    Habits & Providers  Alcohol-Tobacco-Diet     Tobacco Status: never  Allergies: 1)  ! Pcn  Review of Systems  The patient denies anorexia, weight loss, abdominal pain, melena, hematuria, genital sores, and suspicious skin lesions.    Physical Exam  General:  Well-developed,well-nourished,in no acute distress; alert,appropriate and cooperative throughout examination Abdomen:  Bowel sounds positive,abdomen soft and non-tender without masses, organomegaly or hernias noted. 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  Case discussed with Dr. Penne Lash.  As this condition is not affected by estrogen, will try vaginal estrogen to help alleviate symptoms.  Patient to inform us if symptoms worsen or fail to improve.   Her updated medication list for this problem includes:    Premarin 0.625 Mg/gm Crea (Estrogens, conjugated) .Marland Kitchen... Apply to vagina twice a week  Orders: [facility]- Est Level  3 ([address]109)  Complete Medication List: 1)  Hydrochlorothiazide 25 Mg Tabs (Hydrochlorothiazide) .Marland Kitchen.. 1 tablet by mouth daily 2)  Hydrocod Polst-chlorphen Polst 10-8 Mg/50ml Lqcr (Chlorpheniramine-hydrocodone) .... Take 5ml at bedtime (one teaspoon) 3)  Tessalon Perles 100 Mg Caps (Benzonatate) .... Take one tid 4)  Premarin 0.625 Mg/gm Crea (Estrogens, conjugated) .... Apply to vagina twice a week  Other Orders: Urinalysis-[facility] (00000) Prescriptions: PREMARIN 0.625 MG/GM CREA (ESTROGENS, CONJUGATED) apply to vagina twice a week  #1 tube x 1   Entered and Authorized by:   Jettie Pagan MD   Signed by:   Jettie Pagan MD on 05/24/2009   Method used:   Electronically to        [facility] [phone]* (retail)       [address]       [place], [place]  [address]       Ph: [phone]       Fax: [phone]  RxID:   [id number]   Laboratory Results   Urine Tests  Date/Time Received: May 24, 2009 9:25 AM  Date/Time Reported: May 24, 2009 9:51 AM   Routine Urinalysis   Color: yellow Appearance: Clear Glucose: negative   (Normal Range: Negative) Bilirubin: negative   (Normal Range: Negative) Ketone: negative   (Normal Range: Negative) Spec. Gravity: >=1.030   (Normal Range: 1.003-1.035) Blood: trace-intact   (Normal Range: Negative) pH: 5.5   (Normal Range: 5.0-8.0) Protein: negative   (Normal Range:  Negative) Urobilinogen: 0.2   (Normal Range: 0-1) Nitrite: negative   (Normal Range: Negative) Leukocyte Esterace: trace   (Normal Range: Negative)  Urine Microscopic WBC/HPF: 5-10 RBC/HPF: 0-3 Bacteria/HPF: 2+ Mucous/HPF: 1+ Epithelial/HPF: 5-15    Comments: ...........test performed by...........Marland KitchenTerese Door, CMA

## 2010-04-04 NOTE — Consult Note (Signed)
Summary: [facility]   Imported By: Clydell Hakim 05/05/2009 14:13:25  _____________________________________________________________________  External Attachment:    Type:   Image     Comment:   External Document

## 2010-04-04 NOTE — Progress Notes (Signed)
Summary: pls call      Allergies Added: ! PCN Phone Note Call from Patient Call back at Work Phone [phone]   Caller: Patient Summary of Call: has questions about immunizations Initial call taken by: De Nurse,  November 22, 2008 9:09 AM  Follow-up for Phone Call        has been on meds for H Pylori infection. states the infection has not gone yet. she wondered if they could use PCN.  states she is allergic to PCN. states she had it at age 45 & she had hives & itching. she wanted to know if there was a test to see if she was still allergic to PCN. told her no, after hx of a reax, she should not be rx it again.  having lap band surgery 12/20/08.  urged her to call the GI md & ask what other meds they are going to try. she agreed with plan. message to pcp to add PCN to allergy list Follow-up by: Golden Circle RN,  November 22, 2008 9:13 AM   New Allergies: ! PCN New Allergies: ! PCN

## 2010-04-04 NOTE — Consult Note (Signed)
Summary: [facility]   Imported By: Clydell Hakim 01/11/2009 12:25:13  _____________________________________________________________________  External Attachment:    Type:   Image     Comment:   External Document

## 2010-04-04 NOTE — Consult Note (Signed)
Summary: [facility]   Imported By: Clydell Hakim 01/21/2009 13:35:57  _____________________________________________________________________  External Attachment:    Type:   Image     Comment:   External Document

## 2010-04-06 NOTE — Letter (Signed)
Summary: Generic Letter  [facility]  [address]   [place], [place] [address]   Phone: [phone]  Fax: [phone]    02/15/2010  [name] [address] [place], [place]  [address]  [place]  Dear [name],  I am happy to inform you that your recent CT scan was normal. There was no evidence of cancer.  Sincerely,   [name] DO  Appended Document: Generic Letter mailed

## 2010-04-06 NOTE — Progress Notes (Signed)
Summary: [facility] for ct @ [facility]   Phone Note Outgoing Call   Call placed by: Loralee Pacas CMA,  February 13, 2010 12:57 PM Summary of Call: calling to inform pt that she has been approved to have ct done 12.14.2011 @ 930am and will need to go by [facility] to pick up contrast and to be NPO after midnight the night prior to the CT  Follow-up for Phone Call        spoke with pt and informed her of appt Follow-up by: Loralee Pacas CMA,  February 14, 2010 10:10 AM

## 2010-04-06 NOTE — Progress Notes (Signed)
Summary: Complaint   Phone Note Call from Patient   Caller: Patient Call For: [phone] Summary of Call: Call patient regarding cancelled Ct.  Was told that the authorization from [facility] was obtained prior to scheduling appt.  Please call her back. Initial call taken by: Abundio Miu,  February 08, 2010 4:16 PM  Follow-up for Phone Call        called pt to inform her that the CT had to be rescheduled until prior authorization was obtained from insurance. Pt was very upset that  the CT had to be rescheduled and wanted to know why it had not already been done. Pt requested to speak with Dr Earlene Plater about this, informed her that she was not able to speak with her at this time. Pt then requested to speak with Dennison Nancy, told her that she was in a meeting today and was also not available. Pt says that if she doesn't here back form Earlene Plater today she will call to speak with Olegario Messier tomorrow. Pt was very upset and said that she didn't need me to call her back, didn't want to speak to anyone other than Earlene Plater or Olegario Messier. Follow-up by: Tessie Fass CMA,  February 08, 2010 5:05 PM  Additional Follow-up for Phone Call Additional follow up Details #1::        As this is admin issue, will defer to [facility]. Additional Follow-up by: [name] DO,  February 09, 2010 8:26 AM    Additional Follow-up for Phone Call Additional follow up Details #2::    Spoke with patient this am and apologized.  Explained that this was completely our fault and that the procedure had been prior authorized and rescheduled for tomorrow.  Pt understood.  Told her I would have Royce Stegman call her back with the specifics of the appointment.   Follow-up by: Dennison Nancy RN,  February 14, 2010 10:49 AM  Additional Follow-up for Phone Call Additional follow up Details #3:: Details for Additional Follow-up Action Taken: spoke with pt about appt and she agreed that she would go, stated that she already has contrast and spoke  with someone @ [facility] radiology about the contrast that she still had from the last appt that had to be rescheduled and was told that she could use that Additional Follow-up by: Loralee Pacas CMA,  February 14, 2010 11:32 AM

## 2010-04-08 ENCOUNTER — Encounter: Payer: Self-pay | Admitting: *Deleted

## 2010-05-11 ENCOUNTER — Other Ambulatory Visit: Payer: Self-pay | Admitting: Family Medicine

## 2010-05-11 MED ORDER — ALPRAZOLAM 0.25 MG PO TABS: 0.2500 mg | ORAL_TABLET | Freq: Two times a day (BID) | ORAL | Status: DC | PRN
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
## 2010-05-12 ENCOUNTER — Ambulatory Visit: Payer: Self-pay

## 2010-05-15 ENCOUNTER — Ambulatory Visit: Payer: Self-pay

## 2010-06-08 LAB — COMPREHENSIVE METABOLIC PANEL
ALT: 28 U/L (ref 0–35)
Alkaline Phosphatase: 76 U/L (ref 39–117)
BUN: 8 mg/dL (ref 6–23)
CO2: 33 mEq/L — ABNORMAL HIGH (ref 19–32)
Calcium: 9.4 mg/dL (ref 8.4–10.5)
Calcium: 9.6 mg/dL (ref 8.4–10.5)
Creatinine, Ser: 0.73 mg/dL (ref 0.4–1.2)
GFR calc non Af Amer: >60 mL/min (ref >60)
Glucose, Bld: 78 mg/dL (ref 70–99)
Glucose, Bld: 87 mg/dL (ref 70–99)
Sodium: 143 mEq/L (ref 135–145)
Total Protein: 8.4 g/dL — ABNORMAL HIGH (ref 6.0–8.3)

## 2010-06-08 LAB — DIFFERENTIAL
Basophils Absolute: 0 10*3/uL (ref 0.0–0.1)
Basophils Relative: 0 % (ref 0–1)
Basophils Relative: 1 % (ref 0–1)
Eosinophils Absolute: 0.1 10*3/uL (ref 0.0–0.7)
Lymphocytes Relative: 13 % (ref 12–46)
Lymphocytes Relative: 24 % (ref 12–46)
Lymphs Abs: 2 10*3/uL (ref 0.7–4.0)
Lymphs Abs: 2.3 10*3/uL (ref 0.7–4.0)
Monocytes Absolute: 0.2 10*3/uL (ref 0.1–1.0)
Monocytes Relative: 2 % — ABNORMAL LOW (ref 3–12)
Monocytes Relative: 6 % (ref 3–12)
Neutro Abs: 5.4 10*3/uL (ref 1.7–7.7)
Neutro Abs: 8.5 10*3/uL — ABNORMAL HIGH (ref 1.7–7.7)
Neutrophils Relative %: 66 % (ref 43–77)
Neutrophils Relative %: 55 % (ref 43–77)

## 2010-06-08 LAB — CBC
HCT: 38.6 % (ref 36.0–46.0)
HCT: 41 % (ref 36.0–46.0)
Hemoglobin: 13.2 g/dL (ref 12.0–15.0)
Hemoglobin: 13.9 g/dL (ref 12.0–15.0)
Hemoglobin: 13.1 g/dL (ref 12.0–15.0)
MCHC: 33.9 g/dL (ref 30.0–36.0)
MCHC: 34.3 g/dL (ref 30.0–36.0)
MCHC: 33.3 g/dL (ref 30.0–36.0)
RBC: 4.44 MIL/uL (ref 3.87–5.11)
RBC: 4.47 MIL/uL (ref 3.87–5.11)
RDW: 13.6 % (ref 11.5–15.5)

## 2010-06-09 LAB — CLOTEST (H. PYLORI), BIOPSY: Helicobacter screen: POSITIVE — AB

## 2010-07-18 NOTE — Group Therapy Note (Signed)
[name], [name] NO.:  [id number]   MEDICAL RECORD NO.:  [id number]          PATIENT TYPE:  WOC   LOCATION:  [facility]                   FACILITY:  [facility]   PHYSICIAN:  Tinnie Gens, MD        DATE OF BIRTH:  28-Apr-1965   DATE OF SERVICE:                                  CLINIC NOTE   CHIEF COMPLAINT:  Followup.   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  Since that time, it has  been recommended by GYN Oncology that she have twice yearly exams and  yearly CT scans.  Her last one was in December last year.  It is  reviewed today and is normal.   PHYSICAL EXAMINATION:  VITAL SIGNS:  As noted on the chart.  GENERAL:  She is a well-developed, well-nourished female in no acute  distress.  She is morbidly obese.  GU:  The cuff is within normal limits.  Pelvic is without masses or  tenderness.   IMPRESSION:  Normal examination.  Normal computed tomography.   PLAN:  1. Followup in July.  I am not clear that the patient continues to      need Pap smears, but this can be addressed at that time with her      regular doctor.  2. CT December this year.  3. A mammogram August 2010.           ______________________________  Tinnie Gens, MD     TP/MEDQ  D:  04/02/2008  T:  04/02/2008  Job:  [id number]

## 2010-07-18 NOTE — Group Therapy Note (Signed)
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  The  patient did absolutely well postoperative but per Dr. Kyla Balzarine from [facility], we are to do every-84-month exams and yearly CT scans with  contrast.   Today, the patient has no complaints.  She is sexually active and  possibly is going to get married.  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  She has no bowel or bladder habits.  No  abdominal pain.  She does have some right shoulder issues, and she is  going to have orthopedic surgery.  She is also going to have gastric  bypass this year.   PHYSICAL EXAMINATION:  VITAL SIGNS:  Blood pressure 126/84, pulse 82,  temperature 98.  Weight 334.  Height 5 feet 4.  ABDOMEN:  Soft, nontender, nondistended.  PELVIC:  Bimanual nontender.  No masses.  Very limited secondary to  habitus.   ASSESSMENT/PLAN:  A 45 year old female with history of stromal carcinoid  and cystic teratoma.  The rest of her hysterectomy was benign.  1. CT to be ordered in December.  2. Come to the clinic in January for another exam.           ______________________________  Elsie Lincoln, MD     KL/MEDQ  D:  09/04/2006  T:  09/05/2006  Job:  [id number]

## 2010-07-18 NOTE — Procedures (Signed)
[name], [name]               ACCOUNT NO.:  [id number]   MEDICAL RECORD NO.:  [id number]          PATIENT TYPE:  OUT   LOCATION:  SLEEP CENTER                 FACILITY:  [facility]   PHYSICIAN:  Clinton D. Maple Hudson, MD, FCCP, FACPDATE OF BIRTH:  03/26/1965   DATE OF STUDY:  01/04/2008                            NOCTURNAL POLYSOMNOGRAM   REFERRING PHYSICIAN:  Sylvan Cheese, M.D.   REFERRING PHYSICIAN:  Sylvan Cheese, MD.   INDICATION FOR STUDY:  Hypersomnia with sleep apnea.   EPWORTH SLEEPINESS SCORE:  Epworth sleepiness score 3/24. BMI 53.2.  Weight 310 pounds. Height 64 inches. Neck 16.5 inches.   MEDICATIONS:  Home medications charted and reviewed.   SLEEP ARCHITECTURE:  Total sleep time 288 minutes with sleep efficiency  70.6%.  Stage I was 6.1%.  Stage II 75.6%.  Stage III 12.7%.  REM 5.7%  of total sleep time.  Sleep latency 32 minutes.  REM latency 113  minutes.  Wake after sleep onset  87 minutes.  Arousal index 6.7.  No  bedtime medication was taken.   RESPIRATORY DATA:  Apnea-hypopnea index (AHI) 3.7 per hour.  A total of  18 events were recorded including 6 obstructive apneas, 1 central apnea,  and 11 hypopneas.  Most events were recorded during REM with REM AHI  18.2 per hour, nonpositional.  There were insufficient events to permit  CPAP titration by split-study protocol on the study night.   OXYGEN DATA:  Moderate snoring with oxygen desaturation to a nadir of  87%.  Mean oxygen saturation through the study was 97.3% on room air.   CARDIAC DATA:  Normal sinus rhythm.   MOVEMENT/PARASOMNIA:  Limb jerks with arousal.  A total of 25 events  were counted of which 15 were associated with sleep disturbance for an  index of 3.1 per hour.   IMPRESSIONS/RECOMMENDATIONS:  1. Unremarkable sleep architecture for sleep center environment except      for sustained waking after 2 a.m. for 45 minutes and reduced      percentage time in rapid eye movement.  This can indicate  depression or treatment with antidepressant drugs but can also be a      nonspecific result of the unfamiliar environment.  2. Occasional respiratory events with sleep disturbance, within normal      limits, apnea-hypopnea index 3.7 per hour (normal range 0-5 per      hour).  Moderate snoring with oxygen desaturation to a nadir of      87%.  Most events occurred during rapid eye movement.  3. 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  If      this corresponds to significant account of      movement-related sleep disturbance in the home environment, then      specific therapy such as ReQuip or Mirapex might be considered if      clinically appropriate.      Clinton D. Maple Hudson, MD, [facility], FACP  Diplomate, Biomedical engineer of Sleep Medicine  Electronically Signed     CDY/MEDQ  D:  01/10/2008 16:03:55  T:  01/10/2008 22:19:40  Job:  [id number]

## 2010-07-21 NOTE — Op Note (Signed)
[name], [name]               ACCOUNT NO.:  [id number]   MEDICAL RECORD NO.:  [id number]          PATIENT TYPE:  INP   LOCATION:  9312                          [facility]   PHYSICIAN:  Lesly Dukes, M.D. 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   PROCEDURE:  Total abdominal hysterectomy with left salpingo-  oophorectomy, lysis of adhesions.   SURGEON:  Lesly Dukes, M.D.   ASSISTANT:  Ginger Carne, M.D.   ANESTHESIA:  General.   SPECIMENS:  Uterus, left ovary, pelvic washings.  Frozen section on the  left ovary revealed benign pathology.   ESTIMATED BLOOD LOSS:  300 mL.   COMPLICATIONS:  None.   FINDINGS:  Slightly enlarged uterus with calcified fibroid on the  fundus, left ovary densely adherent to the left sigmoid colon and  sidewall, there is also bowel adhesions to the back of the uterus and  rectus abdominal muscles.   DESCRIPTION OF PROCEDURE:  After informed consent was obtained, the  patient was taken to the operating room where general anesthesia was  induced.  The patient was placed in the dorsal supine position and  prepped and draped in the normal sterile fashion.  A Foley catheter was  placed in the bladder.  A Pfannenstiel skin incision was made with the  scalpel and carried down through the fascia.  The fascia was incised in  the midline and this incision extended bilaterally with the Mayo  scissors.  The superior and inferior aspect of the fascial incision was  grasped with Kocher  clamps, tented up, and dissected off sharply and  bluntly from the underlying layers of rectus muscles.  The rectus  muscles were carefully separated in the midline and the peritoneum was  identified, tented up, and entered sharply with Metzenbaum scissors.  This incision was extended both superiorly and inferiorly with good  visualization of the bladder.  There were dense bowel adhesions noted to  the rectus abdominal wall, these were taken down carefully with  Metzenbaum scissors.  There was not enough visualization due to dense  scarring though bilateral Maylard incisions were made carefully with the  Bovie and there was good hemostasis.  A Bookwalter retractor was used to  aid in examination of the pelvic organs.  Once all adhesions were taken  down from the ovary and the pelvic sidewall, the hysterectomy was then  begun.  The right ovary was surgically absent.  The round ligaments were  identified, transected, and suture ligated with 0 Vicryl. The anterior  leaf of the broad ligament was incised and the bladder flap was created.  The left ureter was dissected out.  The infundibulopelvic ligament was  doubly clamped, transected, and suture ligated with 0 Vicryl x2.  The  left ovary was sent to pathology for frozen section which was benign.  Also of note, we did take washings from the peritoneal cavity and these  were sent to pathology.  The uterine arteries were then skeletonized.  They were then clamped, transected, and suture ligated with 0 Vicryl.  The uterosacral ligaments were then clamped, transected, and suture  ligated with 0 Vicryl.  This was also done with the cardinal ligaments.  Once we reached the bottom of the cervix, the vagina was entered with a  knife and the Jorgenson's scissors were then used to incise the top of  the vaginal cuff and the uterus and cervix were free and handed off the  field.  The vagina then was isolated and irrigated.  The vaginal angles  were  closed using 0 Vicryl and the vaginal cuff was closed with 0 Vicryl  in a running locked fashion.  There was good hemostasis noted.  All  pedicles were re-examined one last time and noted to be hemostatic.  Copious irrigation of the pelvis was done, again.  Again, hemostasis was  assured one last time.  All instruments and lap pads were removed from  the patient's abdomen.  The Maylard incision and the peritoneal surfaces  were found to be hemostatic.  The fascia was closed with 0 Vicryl in a  running fashion with 0 looped PDS.  Good hemostasis was noted.  The  subcutaneous tissues were copiously irrigated and the skin was closed  with staples.  The patient tolerated the procedure well.  Sponge, lap,  and instrument counts were correct x 2.  The patient went to the  recovery room in stable condition.           ______________________________  Lesly Dukes, M.D.     KHL/MEDQ  D:  02/12/2006  T:  02/12/2006  Job:  [id number]

## 2010-07-21 NOTE — Consult Note (Signed)
[name], [name]               ACCOUNT NO.:  [id number]   MEDICAL RECORD NO.:  [id number]          PATIENT TYPE:  OUT   LOCATION:  GYN                          FACILITY:  [facility]   PHYSICIAN:  Paola A. Duard Brady, MD    DATE OF BIRTH:  06-Apr-1965   DATE OF CONSULTATION:  12/04/2005  DATE OF DISCHARGE:                                   CONSULTATION   The patient is seen today in consultation at request of Dr. Elsie Lincoln.  [name] is a 45 year old gravida 2, para 2 who was seen initially by a  family practice Dr. Evonnie Pat, at [facility] family practice.  She was seen  for some rectal bleeding.  She did have an ultrasound was negative for  kidney stones and ultimately in January 2007 underwent a CAT scan that  revealed a right ovarian cyst. Ultrasound was scheduled and it revealed the  cyst to be stable.  She was seen by Dr. Penne Lash initially in May 2007 and  the decision was made to proceed with surgery.  Therefore on October 09, 2005  the patient went exploratory laparotomy RSO.  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  In addition there was a focus of 0.4 cm of carcinoid visualized.  This was compatible with a stromal carcinoid.  The surface was negative and  the tube was negative.  For this reason the patient comes in to see [place]  today.  She states she did very well from that surgery. It was done via a  low transverse skin incision.  She states she feels that she did pull a  muscle.  She has a longstanding history of constipation and she states that  if she does not take a stool softener or flaxseed oil or drink significant  water she has significant constipation.  She did undergo a colonoscopy 3  years ago at which time she had three benign polyps removed.  This was done  at [facility].  The path was negative and she is not sure when they want to see  her back.   PAST MEDICAL HISTORY:  Significant for  hypertension.   MEDICATIONS:  Hydrochlorothiazide 25 mg daily and flaxseed oil.   ALLERGIES:  PENICILLIN, WHICH CAUSES ITCHING.   SOCIAL HISTORY:  She does not drink or smoke.  She is divorced.  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   PAST SURGICAL HISTORY:  She had hemorrhoidectomy in September 2006, cesarean  section in November 1981.  Cesarean section September 1982, cholecystectomy  in November 1990, tubal ligation in August 1992, laparotomy and August 2007.   FAMILY HISTORY:  Mother had thyroid cancer and hypertension,  hypercholesterolemia, osteoarthritis.  There is no gynecologic malignancies  in the family.   PHYSICAL EXAMINATION:  Weight 320 pounds, height 5 feet 4, well-nourished,  well-developed female in no acute distress.  NECK:  Is supple with no lymphadenopathy, no thyromegaly.  LUNGS:  Were clear to auscultation  bilaterally.  CARDIOVASCULAR:  Regular rate and rhythm.  ABDOMEN:  Shows well-healed transverse skin incision.  Abdomen is morbidly  obese, soft, nontender, nondistended.  No palpable masses or  hepatosplenomegaly though exam is limited by habitus.  Groins are negative  for adenopathy.  EXTREMITIES:  She has no edema.  PELVIC:  External genitalia is within normal limits.  Vagina is well  epithelialized.  The cervix is nulliparous in appearance, has a physiologic  discharge.  Bimanual examination:  The cervix is palpably normal.  I cannot  appreciate the size of the uterus secondary to habitus.  There is no gross  ovarian enlargement or uterine enlargement.   ASSESSMENT:  A 45 year old with a focus of a carcinoid with a stroma ovarii  within a mature teratoma.  I had a lengthy discussion with the patient.  I  spoke with the reviewing pathologist as well.  I discussed with the patient  on review of the literature, these typically behave in a benign fashion.  There have been less than 50 cases  of stromal carcinoid reported in the  literature and these typically have a benign course.  The patient is quite  anxious and she would like to have the other ovary removed even though I  think that the bilateral rate of this is quite low as the bilateral rate of  any germ cell tumor is fairly low.  She would like to have a hysterectomy  done.  We looked at our schedule.  She would be interested in having the  surgery done before the holidays and was considering the possibility of  December 11.  She was given this day.  She will contact Telford Nab to  confirm the surgery and will therefore contact Dr. Penne Lash to see if she is  available to assist [place] in performing the patient's surgery.  The patient's  questions were elicited and answered to her satisfaction.  She is very  pleased with the news that this typically is a benign process.      Paola A. Duard Brady, MD  Electronically Signed     PAG/MEDQ  D:  12/04/2005  T:  12/05/2005  Job:  [id number]   cc:   Lesly Dukes, M.D.   Telford Nab, R.N.  [address]  [place], [place] [address]

## 2010-07-21 NOTE — Assessment & Plan Note (Signed)
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  She is  referred today for evaluation of intermittent rectal bleeding, which has  been a problem for her on and off for several years.  I previously saw her  in August of 2002, at which time she had a negative colonoscopy except for  some left colon diverticulosis and external hemorrhoids.  She is status post  cholecystectomy.  She also had previous hemorrhoid surgery by Dr. Ovidio Kin many years ago.  When I last saw her, she was struggling with  obesity, and was on Xenical therapy.  She has a history of previous  PENICILLIN reactions.   She currently has rather marked constipation in that she has difficulty  evacuating her rectum,and also has abdominal gas and bloating.  She uses  p.r.n. Ex-Lax and enemas.  When she strains, she does see bright red blood  per rectum.  She denies upper GI complaints, dysphagia, or any hepatobiliary  symptomatology.  Her appetite is good, and her weight is stable.  She denies  any specific food intolerances.   PAST MEDICAL HISTORY:  Has revolved recently around resection of a dermoid  tumor in her right ovary on August 7.  Apparently at that time she also had  a small carcinoid tumor removed.  I do not have these pathologic reports for  review.  She has planned further oophorectomy, hysterectomy and pelvic  cleanout on February 12, 2006.  She apparently has had previous abdominal CT  scan in January of this past year.  Again, I do not have that report for  review.   The patient also suffers from essentially hypertension, had a  cholecystectomy in 1990, and tubal ligation in 1992.  Her hemorrhoidectomy  was in 1996.   FAMILY HISTORY:   Remarkable for atherosclerosis and diabetes in multiple  family members.   SOCIAL HISTORY:  She is single and lives by herself.  She denies alcohol or  cigarette abuse.   REVIEW OF SYSTEMS:  Noncontributory, without current cardiovascular,  pulmonary, genitourinary, neurologic, orthopedic, endocrine or  neuropsychiatric problems.   MEDICATIONS:  Her only medication at this time is hydrochlorothiazide 25 mg  a day.   ALLERGIES:  She does have PENICILLIN allergy.   PHYSICAL EXAMINATION:  She is an obese but healthy-appearing black female,  appearing her stated age in no acute distress.  She is 5 feet 5 inches tall, and weighs 330 pounds.  Blood pressure is  118/86 and pulse was 80 and regular.  I cannot appreciate stigmata of chronic liver disease.  CHEST:  Clear.  CARDIAC:  Exam shows no murmurs, gallops or rubs.  I could not appreciate hepatosplenomegaly, abdominal masses or tenderness,  although abdominal exam was very difficult because of her obesity.  There was no peripheral edema or phlebitis.  Mental status was  clear.  RECTAL:  Exam was deferred at her request.   ASSESSMENT:  1. Chronic functional constipation with previous negative colonoscopy in      2002.  2. Rectal bleeding, probably from recurrent hemorrhoids.  3. 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  4. Exogenous obesity.  5. Essentially hypertension.  6. Status post cholecystectomy.   RECOMMENDATIONS:  1. A trial of Amitiza 24 mcg twice a day with meals, along with high fiber      diet, liberal fluids by mouth.  2. Outpatient colonoscopy at her convenience.  3. Screening laboratory parameters including CBC, metabolic profile,      glucose, thyroid function tests and sedimentation rate.  4. Continue hydrochlorothiazide as previously outlined, with part of her      laboratory workup be sure that she does not have an electrolyte       imbalance contributing to her constipation.       Vania Rea. Jarold Motto, MD, Clementeen Graham, [place]      DRP/MedQ  DD:  12/18/2005  DT:  12/19/2005  Job #:  [id number]   cc:   Rejeana Brock A. Duard Brady, MD  Dr. Helaine Chess

## 2010-07-21 NOTE — Op Note (Signed)
[name], [name]               ACCOUNT NO.:  [id number]   MEDICAL RECORD NO.:  [id number]          PATIENT TYPE:  INP   LOCATION:  9307                          [facility]   PHYSICIAN:  Lesly Dukes, M.D. DATE OF BIRTH:  [birth year]   DATE OF PROCEDURE:  10/09/2005  DATE OF DISCHARGE:                                 OPERATIVE REPORT   PREOPERATIVE DIAGNOSIS:  A 45 year old female with right dermoid cyst.   POSTOPERATIVE DIAGNOSIS:  A 45 year old female with right dermoid cyst.   PROCEDURE:  Exploratory laparotomy with right salpingo-oophorectomy.   SURGEON:  Lesly Dukes, M.D.   ASSISTANT:  Ginger Carne, MD   ANESTHESIA:  General.   PATHOLOGY:  Right ovary and fallopian tube.   ESTIMATED BLOOD LOSS:  Minimal.   COMPLICATIONS:  None.   FINDINGS:  1.  A cystic teratoma on frozen section, small pedunculated fibroid on the      superior portion of the uterus.  2.  Left ovary with a corpus luteum cyst.  3.  Left fallopian tube.  Stool in the colon.   PROCEDURE:  After informed consent was obtained, the patient was taken to  the operating room where general anesthesia was induced.  Patient was placed  in the dorsal supine position and prepared in a normal sterile fashion.  A  Foley was placed in the bladder.  A Pfannenstiel incision was made with a  scalpel and carried down to the fascia.  The fascia was incised in the  midline.  The incision was extended bilaterally.  The rectus muscles were  separated in the midline.  The peritoneum was entered bluntly, and the  incision was extended both superior and inferiorly with good visualization  of the bowel and bladder.  A self-retaining __________ retractor was placed  into the abdomen, and the bowel was packed away with moist laps.  The right  ovary and fallopian tube were brought to the surgical field.  The ureter was  palpated and visualized and noted to be out of the way.  The IP and the  utero-ovarian  ligament were clamped, transected, and suture ligated with 0  Vicryl x2 and noted to be hemostatic.  Exploration of the abdominal contents  were performed with the above findings.  One last look at the pedicles, and  they were noted to be hemostatic.  All instruments and lap pads were removed  from the patient's abdomen, and all counts were correct.  The fascia was closed with 0 Vicryl.  The subcutaneous tissue was copiously  irrigated and noted to be hemostatic.  The skin was closed with staples.  Patient tolerated the procedure well.  Sponge, lap, instrument, and needle  count were correct x2.  The patient was taken to the recovery room in stable  condition.           ______________________________  Lesly Dukes, M.D.     KHL/MEDQ  D:  10/09/2005  T:  10/09/2005  Job:  [id number]

## 2010-07-21 NOTE — Group Therapy Note (Signed)
[name], [name] NO.:  [id number]   MEDICAL RECORD NO.:  [id number]          PATIENT TYPE:  WOC   LOCATION:  [facility]                   FACILITY:  [facility]   PHYSICIAN:  Elsie Lincoln, MD      DATE OF BIRTH:  [birth year]   DATE OF SERVICE:  07/18/2005                                    CLINIC NOTE   The patient is a 45 year old female para 2, LMP July 01, 2005, who presents  with a dermoid cyst.  She is a known family practice patient.  She has  approximately 6.5-7 cm dermoid in the right adnexa that measures 6.5 x 5 x  4.  There was fat noted.  She also notes two small fibroids that are  inconsequential.  The patient is here to talk about surgery.  Given her age,  I think an oophorectomy would be indicated.  We talked about routes.  Given  that the patient is morbidly-obese and that the ovary is greater than 7 cm,  I believe that a mini laparotomy and oophorectomy would be the best method.  The patient understands this.   PAST MEDICAL HISTORY:  Hypertension.   PAST SURGICAL HISTORY:  C-section x2, laparoscopic cholecystectomy, BTL.   GYNECOLOGICAL HISTORY:  C-section x2.  No history of fibroid tumors, ovarian  cysts other than above, sexually-transmitted diseases, or Pap smears that  are not normal.   MEDICATIONS:  Hydrochlorothiazide 25 mg daily.   ALLERGIES:  PENICILLIN.   FAMILY HISTORY:  No history of clotting blood in legs or lungs.  Aunt has  breast cancer.  Mother has high blood pressure.   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   REVIEW OF SYSTEMS:  Positive for swelling in legs and fatigue.   ANESTHESIA HISTORY:  During her hemorrhoidectomy the patient had a lot of  bleeding from her nose and mouth.  I will request her chart and look at this  to make sure there is no problem with her airway or anything else that needs  to be known before  surgery.   PHYSICAL EXAMINATION:  ABDOMEN:  Soft, obese, nontender.  No rebound, no  guarding, no organomegaly, no hernia.  PELVIC:  Genitalia:  Tanner V.  Vagina:  Pink, normal rugae.  Vulva:  No  lesions.  Cervix:  Closed, nontender.  Right adnexa:  Tender to deep  palpation but cannot feel the cyst.  Left adnexa:  No masses, nontender.   ASSESSMENT AND PLAN:  A 45 year old female with dermoid cyst.   1.  Schedule for exploratory laparotomy and oophorectomy.  2.  Get [facility] operative note from 1996 to make sure there is no problem      with anesthesia.           ______________________________  Elsie Lincoln, MD     KL/MEDQ  D:  07/18/2005  T:  07/19/2005  Job:  [id number]

## 2010-07-21 NOTE — Assessment & Plan Note (Signed)
[facility]                         GASTROENTEROLOGY OFFICE NOTE   [name], [name]                      MRN:          [id number]  DATE:02/05/2006                            DOB:          01-Dec-1965    [name] was hypokalemic and we have corrected her potassium level.  Her  colonoscopy was unremarkable, and she is doing remarkably better on  Amitiza 24 mcg twice a day.  She is scheduled for a repeat pelvic  surgery with removal of another ovarian tumor within the next few weeks.  All of her lab data was unremarkable, except for an elevated sed rate of  64, which is probably related to her gynecologic process.  We will  repeat this when the patient returns for followup in 2 months time.  Her  hypokalemia was related to her hydrochlorothiazide, and I have asked her  to continue her hydrochlorothiazide, potassium replacement, and Amitiza  at current levels.     Vania Rea. Jarold Motto, MD, Caleen Essex, FAGA  Electronically Signed    DRP/MedQ  DD: 02/05/2006  DT: 02/05/2006  Job #: [id number]   cc:   Rejeana Brock A. Duard Brady, MD  Lawerance Sabal, MD

## 2010-07-21 NOTE — Discharge Summary (Signed)
[name], [name]               ACCOUNT NO.:  [id number]   MEDICAL RECORD NO.:  [id number]          PATIENT TYPE:  INP   LOCATION:  9312                          [facility]   PHYSICIAN:  Lesly Dukes, M.D. 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   DISCHARGE DIAGNOSIS:  1. Status post total abdominal hysterectomy, lysis of adhesions.  2. History of ovarian carcinoid.   HOSPITAL COURSE:  This is a 45 year old female with a history of  carcinoid tumor on the right ovary, who presented for a total abdominal  hysterectomy with left salpingo-oophorectomy.  The procedure was done on  February 12, 2006.  Please see operative note for complete details.  Postop course was unremarkable.  The patient remained afebrile.  Vital  signs were stable.  Was tolerating meals, ambulating without problems,  urinating without difficulty, and was felt to be stable for discharge on  postop day 2.  The patient was discharged to home with instructions to  follow up at the GYN clinic for staple removal in 4 days, also to follow  up at GYN clinic in 2 weeks for a follow-up.  She was given instructions  to increase activity slowly, no sexual activity for 6 weeks, no driving  for 2 weeks, no lifting for 6 weeks.  May shower or bathe.  May walk up  steps.  Keep the wound clean and dry.   DISCHARGE MEDICATIONS:  Percocet one to two tablets every 4-6 hours as  needed and to resume home medications.     ______________________________  Paticia Stack, MD    ______________________________  Lesly Dukes, M.D.    LNJ/MEDQ  D:  02/14/2006  T:  02/14/2006  Job:  [id number]

## 2010-07-21 NOTE — Discharge Summary (Signed)
[name], [name]               ACCOUNT NO.:  [id number]   MEDICAL RECORD NO.:  [id number]          PATIENT TYPE:  INP   LOCATION:  9307                          [facility]   PHYSICIAN:  Lesly Dukes, M.D. DATE OF BIRTH:  [birth year]   DATE OF ADMISSION:  10/09/2005  DATE OF DISCHARGE:  10/11/2005                                 DISCHARGE SUMMARY   DISCHARGE DIAGNOSES:  1. Right dermoid cyst.  2. Status post exploratory laparotomy and right salpingo-oophorectomy.  3. Hypertension.   DISCHARGE MEDICATION:  Percocet 5/325 1-2 tablets q.4-6 h. p.r.n. pain.   DISCHARGE INSTRUCTIONS:  No heavy lifting.  May shower, but do not submerge  incision.   FOLLOWUP:  The patient is to followup in the gyne clinic in 2 weeks; to call  for that appointment.  The patient is to return to the MA unit for staple  removal on Saturday.   HOSPITAL COURSE:  [name] is a 45 year old female who has a known  history of a right dermoid cyst.  The patient began having left-sided pain,  but had no history of a cyst on the left.  Patient was scheduled for surgery  to have a right dermoid cyst removed, and wanted eval of the left ovary, as  well, at the time of surgery secondary to her pain.  She was taken to the  operating room on October 09, 2005, by Dr. Penne Lash and Dr. Mia Creek and  underwent an exploratory laparotomy, a right oophorectomy, and removal right  dermoid cyst.  There were no complications during the operation.  Please see  operative report for details.   The patient's postsurgical course was unremarkable.  She ambulated well,  tolerated oral intake, had a bowel movement on October 11, 2005.  Her pain  remained well controlled and she was discharged home on October 11, 2005, with  continued pain control regimen and will return for staple removal on postop  day #6.   PROCEDURES AND OPERATIONS:  Exploratory laparotomy with right salpingo-  oophorectomy.   DISCHARGE LABORATORY  INCLUDE:  White blood count of 9.5, hemoglobin of 10.8,  a hematocrit of 31.7, a platelet count of 331.     ______________________________  Levander Campion, M.D.    ______________________________  Lesly Dukes, M.D.    JH/MEDQ  D:  12/21/2005  T:  12/21/2005  Job:  [id number]

## 2010-07-21 NOTE — Group Therapy Note (Signed)
NAMECHELCEY, CAPUTO NO.:  [id number]   MEDICAL RECORD NO.:  [id number]          PATIENT TYPE:  WOC   LOCATION:  [facility]                   [facility]ACILITY:  [facility]   PHYSICIAN:  Elsie Lincoln, MD      DATE OF BIRTH:  23-Jul-1965   DATE OF SERVICE:  12/05/2005                                    CLINIC NOTE   HISTORY:  The patient is a 45 year old female who presents after follow up  with De Blanch M.D.  She has ovarian carcinoma and has decided  to undergo total abdominal hysterectomy and left oophorectomy, Dr. Rada Hay  and myself will doing this on February 12, 2006.  The patient understands  the risks of the surgery;  this was explain in the consult by Dr. Rada Hay.   Dictation ended at this point.           ______________________________  Elsie Lincoln, MD     KL/MEDQ  D:  12/05/2005  T:  12/07/2005  Job:  [id number]

## 2010-07-21 NOTE — Group Therapy Note (Signed)
NAMESARANDA, LEGRANDE NO.:  [id number]   MEDICAL RECORD NO.:  [id number]          PATIENT TYPE:  WOC   LOCATION:  [facility]                   [facility]ILITY[facility]   PHYSICIAN:  Elsie Lincoln, MD      DATE OF BIRTH:  12/29/1965   DATE OF SERVICE:  03/20/2006                                  CLINIC NOTE   POSTOPERATIVE NOTE   The patient is a 45 year old female who had a TH, BSO approximately 5  weeks ago. She had some postoperative vaginal bleeding approximately 1  to 2 weeks into it. She did call the Nurse Line and this resolved on its  own. She has not had sexual intercourse, but is interested in resuming  intercourse with her friend. She has also not gone back to work as she  is due to go back March 27, 2006. She has no problems. She ambulates,  urinates and has normal bowel movements. Does not experience any  tremendous amount of pain. All pathology was negative for cancer and her  cervix had no dysplasia.   PHYSICAL EXAMINATION:  Pulse 98, blood pressure 129/83, weight 320,  height 5 feet, 4-1/2 inches.  GENERAL: Well-nourished, well-developed in no apparent distress.  ABDOMEN: Soft and nontender, nondistended. Well-healed incision.  VAGINA: Long pink normal rugae, well-supported vaginal vault. No  granulation tissue.  BIMANUAL: There are no masses or pain to palpation.   ASSESSMENT/PLAN:  1. A 45 year old female status post TAH, LSO for ovarian carcinoid      found in the right ovary, which also had a dermoid. This was done      prophylactically based on GYN/Oncology recommendations and patient      desires. Dr. Dennie Maizes office called and will fax path reports to      her and to find out if any more recommendations on screening given      that the rest of the pathology was negative.  2. Released to have sexual intercourse and go back to work.  3. Return to clinic in a year.  4. Mammogram up-to-date in August.     ______________________________  Elsie Lincoln, MD     KL/MEDQ  D:  03/20/2006  T:  03/20/2006  Job:  [id number]

## 2010-08-25 ENCOUNTER — Encounter (INDEPENDENT_AMBULATORY_CARE_PROVIDER_SITE_OTHER): Payer: Self-pay | Admitting: Surgery

## 2010-09-12 ENCOUNTER — Ambulatory Visit (INDEPENDENT_AMBULATORY_CARE_PROVIDER_SITE_OTHER): Payer: BC Managed Care – PPO | Admitting: Family Medicine

## 2010-09-12 ENCOUNTER — Encounter: Payer: Self-pay | Admitting: Family Medicine

## 2010-09-12 VITALS — BP 123/81 | HR 68 | Temp 97.8°F | Ht 64.0 in | Wt 243.1 lb

## 2010-09-12 DIAGNOSIS — L988 Other specified disorders of the skin and subcutaneous tissue: Secondary | ICD-10-CM

## 2010-09-12 DIAGNOSIS — R238 Other skin changes: Secondary | ICD-10-CM | POA: Insufficient documentation

## 2010-09-12 MED ORDER — PREDNISONE 20 MG PO TABS: 20.0000 mg | ORAL_TABLET | Freq: Every day | ORAL | Status: AC

## 2010-09-12 NOTE — Patient Instructions (Signed)
For your rash: I think this is likely a poison ivy type of reaction The prednisone should help Take 3 pills/day x 1 week, then 2 pills/day x 1 week then 1 pill/day  Try the same stuff you have been doing for the skin If it is not getting any better in  1 week or is still getting worse, call us to be seen again

## 2010-09-17 NOTE — Progress Notes (Signed)
  Subjective:    Patient ID: [name], female    DOB: [birth year], 45 y.o.   MRN: [id number]  HPI  Pt with 3 weeks of vesicles on bilateral arms.  They have popped over a period of 2 weeks.  The worst symptom is itch, they do not burn.  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  She does not think she has gotten into any poison ivy.  She denies fevers or N/V/D.  She has no myalgias or arthralgias.    Review of Systems Denies CP, SOB, HA, N/V/D, fever     Objective:   Physical Exam    Vital signs reviewed General appearance - alert, well appearing, and in no distress and oriented to person, place, and time Heart - normal rate, regular rhythm, normal S1, S2, no murmurs, rubs, clicks or gallops Chest - clear to auscultation, no wheezes, rales or rhonchi, symmetric air entry, no tachypnea, retractions or cyanosis Skin- vesicular lesions on bilateral arms worst patch is on right forearm with a coalesced patch about 5 cm in diameter.  No underlying redness.  No pus.  Slight clear drainage.      Assessment & Plan:

## 2010-09-17 NOTE — Assessment & Plan Note (Signed)
Poison ivy vs other allergic rxn.  Will try 21 day taper of prednisone for severe vesicular reaction on arm

## 2010-10-18 ENCOUNTER — Ambulatory Visit (INDEPENDENT_AMBULATORY_CARE_PROVIDER_SITE_OTHER): Payer: BC Managed Care – PPO | Admitting: *Deleted

## 2010-10-18 VITALS — BP 130/100

## 2010-10-18 DIAGNOSIS — G44009 Cluster headache syndrome, unspecified, not intractable: Secondary | ICD-10-CM

## 2010-10-18 NOTE — Progress Notes (Signed)
Patient of Dr. Denyse Amass' who came in today for a BP after c/o HA x 3 days.  Her coworkers encouraged her to come in to have it checked.  She says that she has been under a lot of work related stress lately.  BP in left arm was 130/100 and right arm was 138/102.  Advised her to be seen by a physician w/in the next 24 hours.  Dr. Denyse Amass unavailable so gave her an appointment with Dr. Alvester Morin for Thursday at 4:15.

## 2010-10-19 ENCOUNTER — Ambulatory Visit (INDEPENDENT_AMBULATORY_CARE_PROVIDER_SITE_OTHER): Payer: BC Managed Care – PPO | Admitting: Family Medicine

## 2010-10-19 ENCOUNTER — Encounter: Payer: Self-pay | Admitting: *Deleted

## 2010-10-19 ENCOUNTER — Encounter: Payer: Self-pay | Admitting: Family Medicine

## 2010-10-19 VITALS — BP 120/72 | HR 84 | Wt 252.8 lb

## 2010-10-19 DIAGNOSIS — R51 Headache: Secondary | ICD-10-CM

## 2010-10-19 DIAGNOSIS — G44209 Tension-type headache, unspecified, not intractable: Secondary | ICD-10-CM

## 2010-10-19 DIAGNOSIS — I10 Essential (primary) hypertension: Secondary | ICD-10-CM

## 2010-10-19 MED ORDER — PROPRANOLOL HCL 40 MG PO TABS: 40.0000 mg | ORAL_TABLET | Freq: Two times a day (BID) | ORAL | Status: DC

## 2010-10-19 NOTE — Progress Notes (Signed)
  Subjective:    Patient ID: [name], female    DOB: May 29, 1965, 45 y.o.   MRN: [id number]  HPI Pt here for evaluation of HA and  BP. Pt reports HA x 4-5 days. Pt states that she has been under a lot of stress. 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 Pt states that she has been under a lot of stress because she has been  " having to do the work of 2 people". Has also noted elevated BPs into 160s associated with HA.  Pt describes HA as frontal in nature, sometimes with eye involvement. HAs also assd with post neck pain and tightness. Some eye tearing with headache per pt. Pt states that she has a hx/o HAs in the past. HAs usually occur 3-4 times per year. HAs intermittently assd with elevated BPs.    Review of Systems See HPI    Objective:   Physical Exam Gen: up in chair, NAD HEENT: NCAT, EOMI, no light sensitivity on funduscopic exam, TMs clear bilaterally CV: RRR, no murmurs auscultated PULM: CTAB, no wheezes, rales, rhoncii ABD: S/NT/+ bowel sounds  EXT: 2+ peripheral pulses;    Assessment & Plan:

## 2010-10-19 NOTE — Patient Instructions (Signed)
Cluster Headache Cluster headaches are recognized by their pattern of deep, intense head pain and normally occur on one side of the head. Cluster headaches typically:  Are severe in nature.   Occur repeatedly over weeks to months at a time and then followed by periods of no headaches.   Can last 15 minutes to 3 hours.  CAUSE The exact cause of a cluster headache is not known. Some things can "trigger" a cluster headache, such as:  Smoking.   Alcohol.   Not getting enough sleep.   High altitude travel, such as airline travel.   Change of seasons, such as Spring and Fall.   Certain foods like:   Chocolate.   Aged cheeses.   Foods or drinks that contain nitrates, glutamate, aspartame or tyramine.  HOME CARE INSTRUCTIONS  If your caregiver has prescribed medicine for your headaches, take as told by your caregiver.   It is very important to keep any follow-up appointments or referrals provided by your caregiver.   Take medications recommended by your caregiver before air travel to help prevent attacks.   Biofeedback programs may help reduce headache pain. Discuss this with your caregiver.   It may be helpful to keep a headache diary. This may help you find a trend as to what may be triggering your headaches.   Inhaling 100% oxygen may relieve cluster headaches in some people, especially for frequent cluster headaches that occur at night.  SEEK IMMEDIATE MEDICAL CARE IF:  You have any changes from your previous cluster headaches either in intensity or frequency.   You are not getting relief from the medications you are taking for cluster headaches.   You faint or pass out.   Develop weakness or numbness, especially on one side of your body or face.   Develop double vision or other vision changes.   You develop nausea (feeling sick to your stomach) or vomiting.   You cannot keep your balance, or have difficulty talking or walking.   You develop neck pain or neck  stiffness.   An oral temperature above 101.5 F (38.6 C)develops, or as your caregiver suggests.   You have a seizure.  Document Released: 03/29/2004 Document Re-Released: 02/07/2009 [facility] Patient Information 2011 [place], [place].  If testing was done, call for your results. Remember, it is your responsibility to get the results of all testing. Do not assume everything is fine because you do not hear from your caregiver.   Only take over-the-counter or prescription medicines for pain, discomfort, or fever as directed by your caregiver.   Biofeedback, massage, or other relaxation techniques may be helpful.   Ice packs or heat to the head and neck can be used. Use these three to four times per day or as needed.   Physical therapy may be a useful addition to treatment.   If headaches continue, even with therapy, you may need to think about lifestyle changes.   Avoid excessive use of pain killers, as rebound headaches can occur.  1.  SEEK MEDICAL CARE IF:  You develop problems with medications prescribed.   You do not respond or get no relief from medications.   You have a change from the usual headache.   You develop nausea (feeling sick to your stomach) or vomiting.  SEEK IMMEDIATE MEDICAL CARE IF:   Your headache becomes severe.   You have an unexplained oral temperature above 100.5  You develop a stiff neck.   You have loss of vision.   You have  muscular weakness.   You have loss of muscular control.   You develop severe symptoms different from your first symptoms.   You start losing your balance or have trouble walking.   You feel faint or pass out.  MAKE SURE YOU:   Understand these instructions.   Will watch your condition.   Will get help right away if you are not doing well or get worse.  Document Released: 02/19/2005 Document Re-Released: 05/18/2008 [facility] Patient Information 2011 [place], [place].

## 2010-10-20 DIAGNOSIS — R519 Headache, unspecified: Secondary | ICD-10-CM | POA: Insufficient documentation

## 2010-10-20 DIAGNOSIS — R51 Headache: Secondary | ICD-10-CM | POA: Insufficient documentation

## 2010-10-20 NOTE — Assessment & Plan Note (Signed)
Relatively broad differential for HA including HTN induced, tension and cluster headache. Will start pt on propranolol for relatively broad coverage. BPs today reassuring. Pt instructed to follow up with PCP in 1 month. Pt agreeable to plan.

## 2010-10-20 NOTE — Assessment & Plan Note (Signed)
Will cont HCTZ. Will add on propranolol for HTN and HA.

## 2010-11-13 ENCOUNTER — Encounter (INDEPENDENT_AMBULATORY_CARE_PROVIDER_SITE_OTHER): Payer: Self-pay | Admitting: Surgery

## 2010-11-17 ENCOUNTER — Ambulatory Visit (INDEPENDENT_AMBULATORY_CARE_PROVIDER_SITE_OTHER): Payer: BC Managed Care – PPO | Admitting: Family Medicine

## 2010-11-17 ENCOUNTER — Encounter: Payer: Self-pay | Admitting: Family Medicine

## 2010-11-17 VITALS — BP 118/70 | HR 78 | Ht 65.0 in | Wt 251.0 lb

## 2010-11-17 DIAGNOSIS — R51 Headache: Secondary | ICD-10-CM

## 2010-11-17 DIAGNOSIS — Z23 Encounter for immunization: Secondary | ICD-10-CM

## 2010-11-17 DIAGNOSIS — I10 Essential (primary) hypertension: Secondary | ICD-10-CM

## 2010-11-17 NOTE — Patient Instructions (Signed)
Thank you for coming in today. Stop Propranolol. If you start up on headaches again let me know and start back on it.  Keep an eye on your blood pressure. If its over 135/80 we need to mess with it.  Come back in 6-9 months. Make sure you get your mammogram this fall.  I don't think you need a pap.

## 2010-11-17 NOTE — Assessment & Plan Note (Signed)
Doing well. As above. Propranolol. If headaches recur we'll resume.

## 2010-11-17 NOTE — Progress Notes (Signed)
Ms. [name] is here to meet her new doctor and discuss her blood pressure meds.  Hypertension: Doing well with no hypertensive episodes. She checks her blood pressure at home and is always within range. She is currently taking hydrochlorothiazide and propranolol. She would like to stop taking the propranolol as she feels that she does not need it.  No chest pain palpitations syncope or dyspnea.   Headaches: Chronic occurred do to stress at work which no longer exists. She was started on propranolol. She feels that she does not need this medication anymore if she is no longer having headaches and does not have that same stress at work. She denies any side effects or symptoms with her propranolol.  PMH reviewed.  ROS as above otherwise neg Medications reviewed.  Exam:  BP 118/70  Pulse 78  Ht 5\' 5"  (1.651 m)  Wt 251 lb (113.853 kg)  BMI 41.77 kg/m2 Gen: Well NAD HEENT: EOMI,  MMM Lungs: CTABL Nl WOB Heart: RRR no MRG Abd: NABS, NT, ND Exts: Non edematous BL  LE, warm and well perfused.

## 2010-11-17 NOTE — Assessment & Plan Note (Signed)
Doing well plan to stop propranolol as per patient preference. Patient will check her blood pressure at home if elevated will: Restart blood pressure medications. We'll followup in 6-9 months

## 2010-11-23 ENCOUNTER — Other Ambulatory Visit: Payer: Self-pay | Admitting: Family Medicine

## 2010-11-23 ENCOUNTER — Telehealth: Payer: Self-pay | Admitting: Family Medicine

## 2010-11-23 DIAGNOSIS — Z1231 Encounter for screening mammogram for malignant neoplasm of breast: Secondary | ICD-10-CM

## 2010-11-23 NOTE — Telephone Encounter (Signed)
Called patient back.  She was very emotional and could hardly talk.  She states that she's being "railroaded" by her boss and coworkers.  States that she is being asked to do tasks at work that are not part of her job responsibilities.   Because of her refusal to do these tasks she's been described as a "danger to the students" and "needs to be transferred out".    She also says that they are demanding that she take a leave of absence from work.  She says that she is so stressed from this she is not eating and cannot sleep.  Asked her if she was having any thoughts of harming herself and she said no.  I advised her to come in to talk with her PCP to address how to best deal with these emotional issues, that it was not healthy to keep going like this.  Gave her an appointment with Dr. Denyse Amass for tomorrow at 1:30.  Will also route this note to our Child psychotherapist for any additional insight.

## 2010-11-23 NOTE — Telephone Encounter (Signed)
Wants to speak to [name] about her FMLA.  She made an appt to see Dr. Denyse Amass, but his first available isn't until 10/2 which is almost 2 weeks away and she doesn't think her workplace will be willing to wait that long.

## 2010-11-24 ENCOUNTER — Encounter: Payer: Self-pay | Admitting: Family Medicine

## 2010-11-24 ENCOUNTER — Ambulatory Visit (INDEPENDENT_AMBULATORY_CARE_PROVIDER_SITE_OTHER): Payer: BC Managed Care – PPO | Admitting: Family Medicine

## 2010-11-24 DIAGNOSIS — F419 Anxiety disorder, unspecified: Secondary | ICD-10-CM | POA: Insufficient documentation

## 2010-11-24 DIAGNOSIS — F411 Generalized anxiety disorder: Secondary | ICD-10-CM

## 2010-11-24 NOTE — Progress Notes (Signed)
Ms Marmol presents to clinic today for emotional stress at work.   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 This job is normally performed by a college prep person. However this person was not able to do her normal job. Ms Hulsebus had to do her job duties. Her principle thinks that Ms Kaleta is not performing as she should and think that she is not approachable and perhaps a danger to students. Her employer has asked that she take a medical leave of absence. She is stressed by this difficulty at work.  Have been withheld at work since the 29th of August.  She is receiving counseling from her friend who is a Careers information officer. She thinks this is helping some.  PHQ-9  -- 0 MDQ is --  2 GAD-7  --12  (moderate anxiety)   PMH reviewed.  ROS as above otherwise neg Medications reviewed.  Exam:  BP 124/85  Pulse 99  Temp(Src) 98.1 F (36.7 C) (Oral)  Wt 247 lb (112.038 kg) Gen: Well NAD, talkative Exts: Non edematous BL  LE, warm and well perfused.  Psych: Normal affect. Good eye contact. Speech is slightly pressured. Thought process is linear and goal directed. No SI/HI.

## 2010-11-24 NOTE — Assessment & Plan Note (Signed)
Her work situation is very chaotic at the moment.  It seems to me that she is on administrative leave, and her employer is trying to convert that to medical leave.   She does screen positive for moderate anxiety at the moment. However her anxiety has only been for 3 weeks and seems to be directly related to her work situation. At the moment I don't think she has generalized anxiety disorder.  I also don't see any reason why she should be on medical leave.  Plan: Continue counseling, if symptoms don't improve within one to 2 weeks consider starting SSRI. I favor Zoloft in this situation. Patient will call me in one to 2 weeks if she would like to start Zoloft.  Discuss red flags for suicidality or homicidality.

## 2010-12-01 ENCOUNTER — Encounter (INDEPENDENT_AMBULATORY_CARE_PROVIDER_SITE_OTHER): Payer: Self-pay

## 2010-12-01 ENCOUNTER — Ambulatory Visit (INDEPENDENT_AMBULATORY_CARE_PROVIDER_SITE_OTHER): Payer: BC Managed Care – PPO | Admitting: Physician Assistant

## 2010-12-01 VITALS — BP 110/78 | HR 72 | Resp 16 | Ht 64.5 in | Wt 245.6 lb

## 2010-12-01 DIAGNOSIS — Z4651 Encounter for fitting and adjustment of gastric lap band: Secondary | ICD-10-CM

## 2010-12-01 NOTE — Progress Notes (Signed)
  HISTORY: [name] is a 45 y.o.female who received an AP-Large lap-band in October 2010 by Dr. Ezzard Standing. She comes in having gained 11 lbs in the past 3 months. She had been on a 3 week course of prednisone in July for an insect bite and had a peak weight of 252 lbs. She complains today of not being able to tolerate chicken and vegetables unless they're basically pureed.  VITAL SIGNS: Filed Vitals:   12/01/10 1411  BP: 110/78  Pulse: 72  Resp: 16    PHYSICAL EXAM: Physical exam reveals a very well-appearing 45 y.o.female in no apparent distress Neurologic: Awake, alert, oriented Psych: Bright affect, conversant Respiratory: Breathing even and unlabored. No stridor or wheezing Abdomen: Soft, nontender, nondistended to palpation. Incisions well-healed. No incisional hernias. Port easily palpated. Extremities: Atraumatic, good range of motion.  ASSESMENT: 45 y.o.  female  s/p AP-Large lap-band.   PLAN: The patient's port was accessed with a 20G Huber needle without difficulty. Clear fluid was aspirated and 0.25 mL saline was removed from the port to give a total predicted volume of 6.25 mL. We'll have her back in 3 months or sooner if necessary.

## 2010-12-01 NOTE — Patient Instructions (Signed)
Return in 3 months or sooner if needed.

## 2010-12-04 LAB — GLUCOSE, CAPILLARY: Glucose-Capillary: 87

## 2010-12-05 ENCOUNTER — Telehealth: Payer: Self-pay | Admitting: Family Medicine

## 2010-12-05 ENCOUNTER — Ambulatory Visit: Payer: BC Managed Care – PPO | Admitting: Family Medicine

## 2010-12-05 NOTE — Telephone Encounter (Signed)
Ms. Gul need to be referred for counseling.  Having a problem with stress at work.  Please call her back.  Patient very emotional.

## 2010-12-07 ENCOUNTER — Ambulatory Visit: Payer: BC Managed Care – PPO

## 2010-12-07 MED ORDER — SERTRALINE HCL 25 MG PO TABS: 25.0000 mg | ORAL_TABLET | Freq: Every day | ORAL | Status: DC

## 2010-12-07 NOTE — Telephone Encounter (Signed)
Called patient back. She is now feeling much better. As per agreement from previous visit will call in Zoloft. Additionally as patient to call to get an appointment with Dr. Pascal Lux for counseling. Will call patient back and to give her some additional community resources.

## 2010-12-08 NOTE — Telephone Encounter (Signed)
Addendum: Ms. [name] is not feeling better.

## 2010-12-13 ENCOUNTER — Telehealth: Payer: Self-pay | Admitting: Psychology

## 2010-12-13 NOTE — Telephone Encounter (Signed)
Patient left VM requesting therapy.  Reviewed Dr. Zollie Pee notes and phoned patient.  Her work day is 8:15 until 4:10.  She would take sick days to attend therapy.  This sounded like a bad idea given current work stress.  Recommended she call Letha Cape [phone]) to see if she has more flexible hours.  She will call back if this does not work out.

## 2010-12-15 LAB — POCT CARDIAC MARKERS
CKMB, poc: 1.4
CKMB, poc: <1 — ABNORMAL LOW
Myoglobin, poc: 81.2
Operator id: 277751
Operator id: 277751
Troponin i, poc: <0.05

## 2010-12-15 LAB — I-STAT 8, (EC8 V) (CONVERTED LAB)
BUN: 6
Bicarbonate: 30.2 — ABNORMAL HIGH
Chloride: 104
Glucose, Bld: 74
Hemoglobin: 13.9
pCO2, Ven: 54.2 — ABNORMAL HIGH

## 2010-12-15 LAB — DIFFERENTIAL
Basophils Absolute: 0
Lymphocytes Relative: 33
Neutro Abs: 5.2
Neutrophils Relative %: 60

## 2010-12-15 LAB — CBC
Platelets: 408 — ABNORMAL HIGH
RDW: 15.4 — ABNORMAL HIGH

## 2010-12-18 ENCOUNTER — Ambulatory Visit
Admission: RE | Admit: 2010-12-18 | Discharge: 2010-12-18 | Disposition: A | Discharge status: Home / Self Care | Payer: BC Managed Care – PPO | Source: Ambulatory Visit | Attending: Family Medicine | Admitting: Family Medicine

## 2010-12-18 DIAGNOSIS — Z1231 Encounter for screening mammogram for malignant neoplasm of breast: Secondary | ICD-10-CM

## 2010-12-28 ENCOUNTER — Telehealth: Payer: Self-pay | Admitting: Family Medicine

## 2010-12-28 NOTE — Telephone Encounter (Signed)
Fwd. To Dr.Corey. .[name]  

## 2010-12-28 NOTE — Telephone Encounter (Signed)
Needs for Dr Denyse Amass to call her to discuss her case.  Is faxing a ROI

## 2010-12-29 NOTE — Telephone Encounter (Signed)
Called [name] back at [phone] and asked her to call me back.

## 2011-02-15 ENCOUNTER — Telehealth: Payer: Self-pay | Admitting: Family Medicine

## 2011-02-15 NOTE — Telephone Encounter (Signed)
Waiting for pt to call back. Pt needs OV to discuss. Lorenda Hatchet, Renato Battles

## 2011-02-15 NOTE — Telephone Encounter (Signed)
Patient wants to know if she still needs to have the CAT scan this year.  Normally she has to have one in December due to her cancer 5 years ago.  Please call and let her know.

## 2011-02-20 NOTE — Telephone Encounter (Signed)
Fwd. To Dr.Corey. ( pt has not scheduled appt) .[name]

## 2011-03-02 ENCOUNTER — Encounter (INDEPENDENT_AMBULATORY_CARE_PROVIDER_SITE_OTHER): Payer: BC Managed Care – PPO

## 2011-03-29 ENCOUNTER — Telehealth (INDEPENDENT_AMBULATORY_CARE_PROVIDER_SITE_OTHER): Payer: Self-pay

## 2011-03-29 NOTE — Telephone Encounter (Signed)
Called patient to reschedule appointment time to 1015 in morning so [name] can get home to [place] before bad weather sets in. No answer, left message to call me back if she couldn't make that appointment and I would reschedule appointment to another day.

## 2011-03-30 ENCOUNTER — Encounter (INDEPENDENT_AMBULATORY_CARE_PROVIDER_SITE_OTHER): Payer: BC Managed Care – PPO

## 2011-04-03 ENCOUNTER — Telehealth: Payer: Self-pay | Admitting: Family Medicine

## 2011-04-03 NOTE — Telephone Encounter (Signed)
Please call patient regarding lower body CAT scan she should have had in December.  Need to have this sched asap

## 2011-04-04 ENCOUNTER — Encounter (INDEPENDENT_AMBULATORY_CARE_PROVIDER_SITE_OTHER): Payer: Self-pay | Admitting: Physician Assistant

## 2011-04-04 NOTE — Telephone Encounter (Signed)
Not sure exactly this patient needs this test. I called and left a message asking why

## 2011-04-06 NOTE — Telephone Encounter (Signed)
Fwd. To Dr.Corey for review. .Jadi Deyarmin  

## 2011-04-06 NOTE — Telephone Encounter (Signed)
Pt asked that Dr Denyse Amass call her on the number provided in this note [phone] and discuss with her what she needed.  She has had CT scans for the last 4 years and this would be her 5th - this is for the CA she had that was unusual circumstances and our clinic had been scheduling these for her.  She is wanting to talk to Dr Denyse Amass.

## 2011-04-10 NOTE — Telephone Encounter (Signed)
Returned call to patient.  She gets an annual "lower body scan" and was due to have one 02/2011.  Was diagnosed with ovarian cancer in 2007 and had surgeries in August and December 2012.  Has been following up with our office and usually gets scan ordered by her PCP.  Wanted to know if she still needs to have annual CT scan and how long she needs to have annual CT scan.  December 2012 would have been her 5th CT scan.  Spoke with Dr. Bertram Denver office and patient has not been seen there since 2008.  Patient will contact Dr. Bertram Denver office to discuss need for continued CT scans and for possible follow-up appt.  Will call our office back as needed.  Lynn Brooks, RN

## 2011-04-10 NOTE — Telephone Encounter (Signed)
Patient never needed to speak with Medical supervisor.   Nevertheless we discussed that she is trying to decide whether to have a follow up CT for her ovarian disease.  Suggested that she make an appointment with Dr Denyse Amass or Dr Penne Lash to discuss whether to continue these CT scans.   She agreed

## 2011-04-10 NOTE — Telephone Encounter (Signed)
Called - spoke with her briefly - she is in meeting and I will call back

## 2011-04-10 NOTE — Telephone Encounter (Signed)
Called the patient back. She is not satisfied that the medication was not called in on Thursday. She was unaware that the medication had been [facility]. She requests to speak to a supervisor.

## 2011-04-11 NOTE — Telephone Encounter (Signed)
Will follow along.  

## 2011-06-22 ENCOUNTER — Encounter: Payer: Self-pay | Admitting: Obstetrics & Gynecology

## 2011-06-22 ENCOUNTER — Ambulatory Visit (HOSPITAL_COMMUNITY)
Admission: RE | Admit: 2011-06-22 | Discharge: 2011-06-22 | Disposition: A | Discharge status: Home / Self Care | Payer: BC Managed Care – PPO | Source: Ambulatory Visit | Attending: Obstetrics & Gynecology | Admitting: Obstetrics & Gynecology

## 2011-06-22 ENCOUNTER — Ambulatory Visit (INDEPENDENT_AMBULATORY_CARE_PROVIDER_SITE_OTHER): Payer: BC Managed Care – PPO | Admitting: Obstetrics & Gynecology

## 2011-06-22 VITALS — BP 139/96 | HR 72 | Temp 97.0°F | Ht 64.5 in | Wt 293.0 lb

## 2011-06-22 DIAGNOSIS — Z0000 Encounter for general adult medical examination without abnormal findings: Secondary | ICD-10-CM

## 2011-06-22 DIAGNOSIS — Z859 Personal history of malignant neoplasm, unspecified: Secondary | ICD-10-CM

## 2011-06-22 MED ORDER — IOHEXOL 300 MG/ML  SOLN
100.0000 mL | Freq: Once | INTRAMUSCULAR | Status: AC | PRN
  Administered 2011-06-22: 80 mL via INTRAVENOUS

## 2011-06-22 NOTE — Progress Notes (Signed)
  Subjective:    Patient ID: [name], female    DOB: 1965/11/06, 46 y.o.   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 Annual lower body CT scans to evaluate for metastases and biannual Pap smears have been recommended.   The patient is doing well and has no complaints and is here to discuss the need for annual follow-up imaging.   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  She presents today for Pap smear and questioning whether she needs annual CT abd/pelvis to evaluate for metastases.   -According to her records, Dr. Kyla Balzarine from [facility] had recommended yearly CT scans with contrast and biannual pelvic exams. Due to financial restraints, PCP may consider order CT abd/pelvis with contrast annually and if abnormal, may refer back to Gynecology clinic at that time, although we would be happy to see her here as well.  -Pap smear performed today.

## 2011-10-03 ENCOUNTER — Telehealth: Payer: Self-pay | Admitting: Family Medicine

## 2011-10-03 NOTE — Telephone Encounter (Signed)
Patient called reporting that beginning yesterday had ar area develop on the back of her neck at the hariline that was swoolen and painful to touch.  Has been growing.  No systemic symptoms.  No drainage.  Recommended that patient call clinic in the AM for SDA appointment for eval and possible I+D.  Recommended that she come to ED if develops fevers/chills/n/v.

## 2011-10-29 ENCOUNTER — Other Ambulatory Visit: Payer: Self-pay | Admitting: *Deleted

## 2011-10-29 MED ORDER — HYDROCHLOROTHIAZIDE 25 MG PO TABS: 25.0000 mg | ORAL_TABLET | Freq: Every day | ORAL | Status: DC

## 2011-11-30 ENCOUNTER — Ambulatory Visit (INDEPENDENT_AMBULATORY_CARE_PROVIDER_SITE_OTHER): Payer: BC Managed Care – PPO | Admitting: *Deleted

## 2011-11-30 DIAGNOSIS — Z23 Encounter for immunization: Secondary | ICD-10-CM

## 2012-04-16 ENCOUNTER — Telehealth: Payer: Self-pay | Admitting: Family Medicine

## 2012-04-16 NOTE — Telephone Encounter (Signed)
Pt is asking to speak to nurse about her BP - she has been out for 4 months but has appt on 2/27.  If she can't get meds, what can she use for OTC?  pls advise

## 2012-04-16 NOTE — Telephone Encounter (Signed)
Patient has not seen in our clinic since 11/2010.  Given that I do not know this patient and it has been so long since she has been seen I will not refill medication at this time especially since she has been out for 4 months..  Please have her keep upcoming appointment.  I would advise against an over the counter diuretic

## 2012-04-16 NOTE — Telephone Encounter (Signed)
Message from Dr. Ashley Royalty given to patient.   Patient voiced much  displeasure with Dr. Ashley Royalty decision, not to refill her meds until her appointment.  Was talking quite loudly over the phone. States "  tell Olegario Messier to call me, I want  to change my doctor to an experienced doctor, not an out of the box doctor. He can look at my records and see I have been on this medicine for decades". 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 Has appointment scheduled 02/27.   Advised patient message will be sent to [facility].

## 2012-04-16 NOTE — Telephone Encounter (Signed)
Spoke with patient and the reason she ran out of BP med ,HCTZ , was because she has not been able to get in for appointment due to her work schedule. .  States her feet and hands are swelling and she wants to take an OTC diuretuc. Advised not to do this because it can cause imbalance in electrolytes . She states she has been on HCTZ for decades.  States" I will take a diurectic OTC ". Advised I will send message  to Dr. Ashley Royalty.

## 2012-04-25 ENCOUNTER — Emergency Department (HOSPITAL_COMMUNITY)
Admission: EM | Admit: 2012-04-25 | Discharge: 2012-04-25 | Disposition: A | Discharge status: Home / Self Care | Payer: BC Managed Care – PPO | Source: Home / Self Care | Attending: Family Medicine | Admitting: Family Medicine

## 2012-04-25 ENCOUNTER — Telehealth: Payer: Self-pay | Admitting: Sports Medicine

## 2012-04-25 ENCOUNTER — Encounter (HOSPITAL_COMMUNITY): Payer: Self-pay | Admitting: Emergency Medicine

## 2012-04-25 DIAGNOSIS — R609 Edema, unspecified: Secondary | ICD-10-CM

## 2012-04-25 DIAGNOSIS — R600 Localized edema: Secondary | ICD-10-CM

## 2012-04-25 LAB — POCT I-STAT, CHEM 8
Creatinine, Ser: 0.8 mg/dL (ref 0.50–1.10)
Hemoglobin: 12.6 g/dL (ref 12.0–15.0)
Sodium: 143 mEq/L (ref 135–145)
TCO2: 30 mmol/L (ref 0–100)

## 2012-04-25 MED ORDER — FUROSEMIDE 20 MG PO TABS: 20.0000 mg | ORAL_TABLET | Freq: Every day | ORAL | Status: DC

## 2012-04-25 NOTE — ED Provider Notes (Signed)
History     CSN: [id number]  Arrival date & time 04/25/12  1844   First MD Initiated Contact with Patient 04/25/12 1849      Chief Complaint  Patient presents with  . Leg Pain    bilateral swelling and leg tightness.     (Consider location/radiation/quality/duration/timing/severity/associated sxs/prior treatment) HPI Comments: 47 year old female with history of morbid obesity and hypertension. Here complaining of worsening bilateral chronic ankle swelling for 2 weeks. Patient states that she used to take hydrochlorothiazide 25 mg daily but ran out about 4 months ago and has not been seen at her primary care office since 2012. Patient is asking me to "predcribed a fluid pill". She also reports intermittent tingling sensation in her fingers and toes. Denies numbness or weakness in any of her extremities. Denies chest pain or shortness of breath. No prior history of congestive heart failure. Denies PND. 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 Also reports has gained more weight during the last year. She has normal vital signs and her blood pressure is normal today at 134/78.   Past Medical History  Diagnosis Date  . Wears glasses   . Sinus complaint   . IBS (irritable bowel syndrome)   . Hyperlipidemia   . Hypertension   . Morbid obesity     Past Surgical History  Procedure Laterality Date  . Cholecystectomy    . Hemorroidectomy    . Abdominal hysterectomy    . Gastric restriction surgery      lap band    Family History  Problem Relation Age of Onset  . Thyroid cancer Mother   . Hypertension Mother   . Hyperlipidemia Mother   . Breast cancer      aunts    History  Substance Use Topics  . Smoking status: Never Smoker   . Smokeless tobacco: Never Used  . Alcohol Use: No    OB History   Grav Para Term Preterm Abortions TAB SAB Ect Mult Living   2 2 2       2       Review  of Systems  Constitutional: Negative for fever, chills, diaphoresis and fatigue.  Respiratory: Negative for cough, chest tightness and wheezing.   Cardiovascular: Positive for leg swelling. Negative for chest pain and palpitations.  Gastrointestinal: Negative for nausea and abdominal pain.  Endocrine: Negative for cold intolerance, heat intolerance, polydipsia, polyphagia and polyuria.  Neurological: Negative for dizziness, weakness and headaches.  All other systems reviewed and are negative.    Allergies  Penicillins  Home Medications   Current Outpatient Rx  Name  Route  Sig  Dispense  Refill  . hydrochlorothiazide (HYDRODIURIL) 25 MG tablet   Oral   Take 1 tablet (25 mg total) by mouth daily. Needs appt before additional refills.   30 tablet   0   . EXPIRED: sertraline (ZOLOFT) 25 MG tablet   Oral   Take 1 tablet (25 mg total) by mouth at bedtime. After two weeks start taking 2 pills a day.   45 tablet   1     BP 134/78  Pulse 74  Temp(Src) 98.7 F (37.1 C) (Oral)  Resp 20  SpO2 100%  Physical Exam  Nursing note and vitals reviewed. Constitutional: She is oriented to person, place, and time. She appears well-developed and well-nourished. No distress.  HENT:  Head: Normocephalic and atraumatic.  Eyes:  Conjunctivae are normal.  Neck: No JVD present. No thyromegaly present.  Cardiovascular: Normal rate, regular rhythm and normal heart sounds.   There is bilateral non pitting ankle edema. No calf erythema or tenderness, no skin ulcers or brakes.    Pulmonary/Chest: Effort normal and breath sounds normal. No respiratory distress. She has no wheezes. She has no rales. She exhibits no tenderness.  Neurological: She is alert and oriented to person, place, and time.  Skin: No rash noted. She is not diaphoretic.    ED Course  Procedures (including critical care time)  Labs Reviewed - No data to display No results found.   1. Bilateral lower extremity edema        MDM  Explained patient that most effective measures to decrease her chronic low extremity edema will be losing weight, leg elevation, compression stockings and avoid prolong sitting or standing. She was adamant about a fluid pill prescription. I checked her point-of-care glucose, electrolytes and creatinine before prescribing Lasix 20 mg daily x3. I explained patient that these test results are not substitute for fasting labs, and if any relief of her low extremity edema with this short course of lasix will probably be temporary and she could benefit from lifestyle modifications which is safer than taking lasix long term. Also encouraged to followup with her primary care provider to monitor her symptoms and to determine if further work up is required.  Supportive acre and red flags that should prompt her return to medical attention discussed with patient and provided in writing.     Sharin Grave, MD 04/25/12 2111

## 2012-04-25 NOTE — ED Notes (Signed)
Pt c/o bilateral swelling and tightness of legs since February 10th. Pt states that she takes hctz 25 mg for fluid but has not had medication in four months.  Pt denies any other symptoms.

## 2012-04-25 NOTE — ED Notes (Signed)
Waiting discharge papers 

## 2012-04-25 NOTE — Telephone Encounter (Signed)
[facility] After Hours Emergency Line - Weekend  Pt called asking where she should go to be seen for her "upper and lower extremity swelling that is happening due to running out of her HCTZ".  She has not been seen in clinic since 2012 and doesn't want to wait until her appointment next week.  Questioning if she should go to Urgent Care or ED.  Instructed to go to Urgent Care if unable to wait until appointment with Dr. Ashley Royalty.  Encouraged to make it to the f/u appointment with Dr. Ashley Royalty either way but will have office staff contact on Monday to ensure she will be there.  Andrena Mews, DO [facility] Resident - PGY-2 04/25/2012 6:06 PM

## 2012-05-01 ENCOUNTER — Ambulatory Visit (INDEPENDENT_AMBULATORY_CARE_PROVIDER_SITE_OTHER): Payer: BC Managed Care – PPO | Admitting: Family Medicine

## 2012-05-01 ENCOUNTER — Encounter: Payer: Self-pay | Admitting: Family Medicine

## 2012-05-01 VITALS — BP 114/89 | HR 87 | Ht 64.5 in

## 2012-05-01 DIAGNOSIS — I10 Essential (primary) hypertension: Secondary | ICD-10-CM

## 2012-05-01 DIAGNOSIS — E669 Obesity, unspecified: Secondary | ICD-10-CM

## 2012-05-01 LAB — COMPREHENSIVE METABOLIC PANEL
Alkaline Phosphatase: 85 U/L (ref 39–117)
BUN: 15 mg/dL (ref 6–23)
Creat: 0.69 mg/dL (ref 0.50–1.10)
Glucose, Bld: 81 mg/dL (ref 70–99)
Sodium: 140 mEq/L (ref 135–145)
Total Bilirubin: 0.5 mg/dL (ref 0.3–1.2)
Total Protein: 6.7 g/dL (ref 6.0–8.3)

## 2012-05-01 MED ORDER — HYDROCHLOROTHIAZIDE 25 MG PO TABS: 25.0000 mg | ORAL_TABLET | Freq: Every day | ORAL | Status: DC

## 2012-05-01 NOTE — Patient Instructions (Addendum)
Thank you for coming in today, it was very nice to meet you I will let you know the results of your lab work when it returns I will send in a 90 day supply of your hydrochlorothiazide to your pharmacy Please follow up in 6 months to meet your new primary care physician or sooner with me as needed.

## 2012-05-05 NOTE — Assessment & Plan Note (Signed)
Patient was upset that she had to come in for an appointment for refills.  I explained to her with her history of hypertension she needs yearly lab work to monitor kidney function and electrolytes while on HCTZ.  Her BP appears well controlled today.  Refilled her HCTZ and will check lab work today.

## 2012-05-05 NOTE — Assessment & Plan Note (Signed)
Declined to be weighed today.  Advised increased activity to improve overall health.

## 2012-05-05 NOTE — Progress Notes (Signed)
  Subjective:    Patient ID: [name], female    DOB: [birth year], 47 y.o.   MRN: [id number]  HPI  1. CHRONIC HYPERTENSION  Disease Monitoring  Blood pressure range: Does not typically monitor at home  Chest pain: NO   Dyspnea: no   Claudication: no   Medication compliance: yes, but has been out recently.  Typically uses HCTZ more for her LE swelling.  Medication Side Effects  Lightheadedness: no   Urinary frequency: no   Edema: yes, worse at the end of the day.   Preventitive Healthcare:  Exercise: no   Diet Pattern: Reports this to be generally healthy  Salt Restriction: None Wants medication mainly so her legs do not swell.  Seen at [facility] and given 3 lasix but never had this filled.  2. Obesity:  Does not seem overly concerned about her weight.  Declined to be weighed today.  She is not very active at home.     Review of Systems Per HPI    Objective:   Physical Exam  Constitutional: She appears well-nourished. No distress.  HENT:  Head: Normocephalic and atraumatic.  Neck: Neck supple. No JVD present.  Cardiovascular: Normal rate and regular rhythm.   No murmur heard. Pulmonary/Chest: Effort normal and breath sounds normal. No respiratory distress.  Musculoskeletal: She exhibits edema (trace).  Neurological: She is alert.          Assessment & Plan:

## 2012-08-15 ENCOUNTER — Ambulatory Visit (INDEPENDENT_AMBULATORY_CARE_PROVIDER_SITE_OTHER): Payer: BC Managed Care – PPO | Admitting: Family Medicine

## 2012-08-15 ENCOUNTER — Encounter: Payer: Self-pay | Admitting: Family Medicine

## 2012-08-15 VITALS — BP 126/86 | HR 68 | Ht 64.5 in

## 2012-08-15 DIAGNOSIS — R0789 Other chest pain: Secondary | ICD-10-CM

## 2012-08-15 DIAGNOSIS — Z8543 Personal history of malignant neoplasm of ovary: Secondary | ICD-10-CM

## 2012-08-15 MED ORDER — OMEPRAZOLE 20 MG PO CPDR: 20.0000 mg | DELAYED_RELEASE_CAPSULE | Freq: Every day | ORAL | Status: DC

## 2012-08-15 MED ORDER — MELOXICAM 15 MG PO TABS: 15.0000 mg | ORAL_TABLET | Freq: Every day | ORAL | Status: DC

## 2012-08-15 NOTE — Patient Instructions (Addendum)
Thank you for coming in today, it was good to see you Try the meloxicam for your pain.  Take this with ompeprazole.  Do not use aleve while taking meloxicam.  Follow up if pain is not improving within 2-3 weeks.   Diet for Gastroesophageal Reflux Disease, Adult Reflux (acid reflux) is when acid from your stomach flows up into the esophagus. When acid comes in contact with the esophagus, the acid causes irritation and soreness (inflammation) in the esophagus. When reflux happens often or so severely that it causes damage to the esophagus, it is called gastroesophageal reflux disease (GERD). Nutrition therapy can help ease the discomfort of GERD. FOODS OR DRINKS TO AVOID OR LIMIT  Smoking or chewing tobacco. Nicotine is one of the most potent stimulants to acid production in the gastrointestinal tract.  Caffeinated and decaffeinated coffee and black tea.  Regular or low-calorie carbonated beverages or energy drinks (caffeine-free carbonated beverages are allowed).   Strong spices, such as black pepper, white pepper, red pepper, cayenne, curry powder, and chili powder.  Peppermint or spearmint.  Chocolate.  High-fat foods, including meats and fried foods. Extra added fats including oils, butter, salad dressings, and nuts. Limit these to less than 8 tsp per day.  Fruits and vegetables if they are not tolerated, such as citrus fruits or tomatoes.  Alcohol.  Any food that seems to aggravate your condition. If you have questions regarding your diet, call your caregiver or a registered dietitian. OTHER THINGS THAT MAY HELP GERD INCLUDE:   Eating your meals slowly, in a relaxed setting.  Eating 5 to 6 small meals per day instead of 3 large meals.  Eliminating food for a period of time if it causes distress.  Not lying down until 3 hours after eating a meal.  Keeping the head of your bed raised 6 to 9 inches (15 to 23 cm) by using a foam wedge or blocks under the legs of the bed. Lying  flat may make symptoms worse.  Being physically active. Weight loss may be helpful in reducing reflux in overweight or obese adults.  Wear loose fitting clothing EXAMPLE MEAL PLAN This meal plan is approximately 2,000 calories based on https://www.bernard.org/ meal planning guidelines. Breakfast   cup cooked oatmeal.  1 cup strawberries.  1 cup low-fat milk.  1 oz almonds. Snack  1 cup cucumber slices.  6 oz yogurt (made from low-fat or fat-free milk). Lunch  2 slice whole-wheat bread.  2 oz sliced [place].  2 tsp mayonnaise.  1 cup blueberries.  1 cup snap peas. Snack  6 whole-wheat crackers.  1 oz string cheese. Dinner   cup brown rice.  1 cup mixed veggies.  1 tsp olive oil.  3 oz grilled fish. Document Released: 02/19/2005 Document Revised: 05/14/2011 Document Reviewed: 01/05/2011 [facility] Patient Information 2014 [place], [place].

## 2012-08-17 DIAGNOSIS — R0789 Other chest pain: Secondary | ICD-10-CM | POA: Insufficient documentation

## 2012-08-17 NOTE — Assessment & Plan Note (Signed)
Follow up CT ordered for surveillance.

## 2012-08-17 NOTE — Assessment & Plan Note (Signed)
Some tenderness to palpation along left sternal area.  GERD could also be contributing as well.  Will add PPI and change to meloxicam for NSAID.  Follow up if not improving.

## 2012-08-17 NOTE — Progress Notes (Signed)
  Subjective:    Patient ID: [name], female    DOB: 05-12-1965, 47 y.o.   MRN: [id number]  HPI  1. Burning chest pain:  Reports burning chest pain for the past month.  Thinks pain is either from heartburn or from her breasts being too heavy.  Pain is worse with certain foods but also feels increase pain when leaning over, like a pulling in her breast area.  She is not up to date on her mammogram.  She has bene using aleve daily for pain.  She denies fever, weight loss, chest pressure, palpitations, nausea, dark or bloody stools.   2. Hx of ovarian malignancy:  Patient with history of ovarian teratoma with carcinoid. S/p hysterectomy. She has been having yearly CT for surveillance.  She denies weight loss, pelvic pain.    Review of Systems Per HPI    Objective:   Physical Exam  Constitutional: She appears well-nourished. No distress.  HENT:  Head: Normocephalic and atraumatic.  Cardiovascular: Normal rate, regular rhythm and normal heart sounds.   Pulmonary/Chest: Effort normal and breath sounds normal. She exhibits tenderness (Along sternal area, L pectoral attachment).  Abdominal: Soft. Bowel sounds are normal. She exhibits no distension. There is no tenderness. There is no guarding.  Musculoskeletal: She exhibits no edema.          Assessment & Plan:

## 2012-08-27 ENCOUNTER — Encounter (HOSPITAL_COMMUNITY): Payer: Self-pay

## 2012-08-27 ENCOUNTER — Ambulatory Visit (HOSPITAL_COMMUNITY)
Admission: RE | Admit: 2012-08-27 | Discharge: 2012-08-27 | Disposition: A | Discharge status: Home / Self Care | Payer: BC Managed Care – PPO | Source: Ambulatory Visit | Attending: Family Medicine | Admitting: Family Medicine

## 2012-08-27 DIAGNOSIS — Z9089 Acquired absence of other organs: Secondary | ICD-10-CM | POA: Insufficient documentation

## 2012-08-27 DIAGNOSIS — K7689 Other specified diseases of liver: Secondary | ICD-10-CM | POA: Insufficient documentation

## 2012-08-27 DIAGNOSIS — D279 Benign neoplasm of unspecified ovary: Secondary | ICD-10-CM | POA: Insufficient documentation

## 2012-08-27 DIAGNOSIS — Z90710 Acquired absence of both cervix and uterus: Secondary | ICD-10-CM | POA: Insufficient documentation

## 2012-08-27 DIAGNOSIS — Z8543 Personal history of malignant neoplasm of ovary: Secondary | ICD-10-CM

## 2012-08-27 MED ORDER — IOHEXOL 300 MG/ML  SOLN
100.0000 mL | Freq: Once | INTRAMUSCULAR | Status: AC | PRN
  Administered 2012-08-27: 100 mL via INTRAVENOUS

## 2012-08-28 ENCOUNTER — Encounter: Payer: Self-pay | Admitting: Family Medicine

## 2012-10-01 ENCOUNTER — Other Ambulatory Visit: Payer: Self-pay

## 2012-10-01 DIAGNOSIS — Z1231 Encounter for screening mammogram for malignant neoplasm of breast: Secondary | ICD-10-CM

## 2012-10-17 ENCOUNTER — Ambulatory Visit
Admission: RE | Admit: 2012-10-17 | Discharge: 2012-10-17 | Disposition: A | Discharge status: Home / Self Care | Payer: BC Managed Care – PPO | Source: Ambulatory Visit

## 2012-10-17 DIAGNOSIS — Z1231 Encounter for screening mammogram for malignant neoplasm of breast: Secondary | ICD-10-CM

## 2012-12-03 ENCOUNTER — Encounter: Payer: Self-pay | Admitting: Family Medicine

## 2012-12-03 ENCOUNTER — Ambulatory Visit (INDEPENDENT_AMBULATORY_CARE_PROVIDER_SITE_OTHER): Payer: BC Managed Care – PPO | Admitting: Family Medicine

## 2012-12-03 VITALS — BP 129/83 | HR 84 | Temp 98.0°F | Ht 64.5 in

## 2012-12-03 DIAGNOSIS — R0789 Other chest pain: Secondary | ICD-10-CM

## 2012-12-03 DIAGNOSIS — Z23 Encounter for immunization: Secondary | ICD-10-CM

## 2012-12-03 MED ORDER — MELOXICAM 15 MG PO TABS: 15.0000 mg | ORAL_TABLET | Freq: Every day | ORAL | Status: DC

## 2012-12-03 MED ORDER — CYCLOBENZAPRINE HCL 5 MG PO TABS: 5.0000 mg | ORAL_TABLET | Freq: Three times a day (TID) | ORAL | Status: DC | PRN

## 2012-12-03 NOTE — Patient Instructions (Addendum)
Thank you for coming in, today!  I think your chest pain is due to the muscles and cartilage in your chest. I will prescribe another month's worth of meloxicam for the inflammation. I will also prescribe a medication to relax your muscles.  This medicine is called Flexeril.  Start by taking a HALF of one tablet, at bedtime.  The medicine might make you sleepy.  You can slowly increase to UP TO 5 mg, threes per day, as needed.  For allergies: You can try several different over-the-counter medicines. Zyrtec (cetirizine), Allegra (fexofenadine) are both good and should not make you sleepy.  Come back to see me as you need. Please feel free to call with any questions or concerns at any time, at [phone]. --Dr. Casper Harrison  Costochondritis Costochondritis is a condition in which the tissue (cartilage) that connects your ribs with your breastbone (sternum) becomes irritated and causes chest pain.  HOME CARE  Avoid activities that wear you out.  Do not strain your ribs. Avoid activities that use your:  Chest.  Belly.  Side muscles.  Put ice on the area.  Put ice in a plastic bag.  Place a towel betwen your skin and the bag.  Leave the ice on for 15-20 minutes, 3-4 times a day.  Only take medicine as told by your doctor.

## 2012-12-04 NOTE — Assessment & Plan Note (Signed)
A: Very likely multifactorial, but history and physical exam strongly suggestive of chest wall pain, possibly simple costochondritis. Not overtly concerning for cardiac etiology. Very likely component of obese body habitus and pendulous, heavy breast tissue, especially given worse pain with movements of upper torso and upper extremities.   P: Rx for meloxicam for one month; could consider this more long-term or PRN. Also given Rx for Flexeril with instructions to start with 2.5 mg qHS, to increase slowly as tolerated up to 5 mg TID. Suggested scheduled daily antihistamine for possible allergy component, and continue PPI for GERD. Discussed supportive care with gentle exercises, ice/heat, etc as well. Plan to f/u PRN.

## 2012-12-04 NOTE — Progress Notes (Signed)
  Subjective:    Patient ID: [name], female    DOB: Jan 08, 1966, 47 y.o.   MRN: [id number]  HPI: Pt presents to clinic for SDA/follow up of continued "pulling" sensation in her chest, present for several months. Pt saw Dr. Ashley Royalty for this previously and was prescribed meloxicam for presumed chest wall pain, which helped "some" but pt took this only for 1 month. Also thought at that time there could be a reflux component, and omeprazole was started, which has helped significantly with heartburn symptoms. Current pain is described as a "pulling" sharp sensation in her mid-chest, currently about 2/10, always present "off and on" but worse with leaning over or some upper body movements, or with cough. Pt denies pressure in her chest or chest tightness, or SOB. Does report some increased congestion and "hoarseness" in the mornings, which she attributes to seasonal allergies that are worse in the spring and fall, which also contribute to her cough and seem to make her chest pain worse. Pt describes congestion worse at night or early in the morning, sometimes causing wakening and coughing.  Review of Systems: As above. Denies fever/chills, abdominal pain, change in bowel/bladder habits, change in vision or headaches, palpitations, or N/V. Note that pt is obese and attributes some of her pain to body habitus and possibly to heaviness of her breasts.     Objective:   Physical Exam BP 129/83  Pulse 84  Temp(Src) 98 F (36.7 C)  Ht 5' 4.5" (1.638 m)  Gen: well-appearing adult female in NAD, very pleasant Cardio: RRR, no murmur appreciated Pulm: CTAB, no wheezes, normal WOB MSK: marked tenderness along sternal border, left worse than right  No tenderness in bilateral chest wall otherwise or in shoulder girdle bilaterally  Pain as described in HPI reproducible with palpation  Pain increased with some movements of torso (twisting, lateral bending, etc), but ROM not limited Abd: soft, obese,  nontender, BS+     Assessment & Plan:

## 2012-12-05 ENCOUNTER — Encounter (INDEPENDENT_AMBULATORY_CARE_PROVIDER_SITE_OTHER): Payer: Self-pay | Admitting: Surgery

## 2012-12-05 ENCOUNTER — Ambulatory Visit (INDEPENDENT_AMBULATORY_CARE_PROVIDER_SITE_OTHER): Payer: BC Managed Care – PPO | Admitting: Surgery

## 2012-12-05 VITALS — BP 134/78 | HR 68 | Temp 98.0°F | Resp 18 | Ht 64.5 in | Wt 315.2 lb

## 2012-12-05 DIAGNOSIS — Z4651 Encounter for fitting and adjustment of gastric lap band: Secondary | ICD-10-CM

## 2012-12-05 DIAGNOSIS — Z9884 Bariatric surgery status: Secondary | ICD-10-CM | POA: Insufficient documentation
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
## 2012-12-15 ENCOUNTER — Telehealth: Payer: Self-pay | Admitting: Family Medicine

## 2012-12-15 DIAGNOSIS — R0789 Other chest pain: Secondary | ICD-10-CM

## 2012-12-15 NOTE — Telephone Encounter (Signed)
I think a chest xray is not likely to show any major abnormality, but it's not unreasonable to get an xray. I will place that order and pt can have the xray done at her convenience, at [facility]. She should schedule an office visit to follow up the results of the xray, also at her convenience. Thanks. --CMS

## 2012-12-15 NOTE — Telephone Encounter (Signed)
Will fwd. To Dr.Street. Lorenda Hatchet, Renato Battles

## 2012-12-15 NOTE — Telephone Encounter (Signed)
Pt called and would like to have a xray of her chest done. She said that Dr. Casper Harrison suggested this and she thinks that it is time. JW

## 2012-12-15 NOTE — Telephone Encounter (Signed)
Called pt. Advised. Lorenda Hatchet, Renato Battles

## 2013-01-30 ENCOUNTER — Ambulatory Visit
Admission: RE | Admit: 2013-01-30 | Discharge: 2013-01-30 | Disposition: A | Discharge status: Home / Self Care | Payer: BC Managed Care – PPO | Source: Ambulatory Visit | Attending: Family Medicine | Admitting: Family Medicine

## 2013-01-30 DIAGNOSIS — R0789 Other chest pain: Secondary | ICD-10-CM

## 2013-02-03 ENCOUNTER — Other Ambulatory Visit: Payer: Self-pay | Admitting: Family Medicine

## 2013-02-03 MED ORDER — HYDROCHLOROTHIAZIDE 25 MG PO TABS: 25.0000 mg | ORAL_TABLET | Freq: Every day | ORAL | Status: DC

## 2013-02-04 ENCOUNTER — Ambulatory Visit (INDEPENDENT_AMBULATORY_CARE_PROVIDER_SITE_OTHER): Payer: BC Managed Care – PPO | Admitting: Family Medicine

## 2013-02-04 ENCOUNTER — Encounter: Payer: Self-pay | Admitting: Family Medicine

## 2013-02-04 VITALS — BP 139/91 | HR 78 | Temp 98.4°F | Ht 64.5 in

## 2013-02-04 DIAGNOSIS — R1310 Dysphagia, unspecified: Secondary | ICD-10-CM | POA: Insufficient documentation

## 2013-02-04 DIAGNOSIS — R0789 Other chest pain: Secondary | ICD-10-CM

## 2013-02-04 MED ORDER — LIDOCAINE VISCOUS 2 % MT SOLN: 20.0000 mL | OROMUCOSAL | Status: DC | PRN

## 2013-02-04 NOTE — Assessment & Plan Note (Signed)
A: Similar to previous visit, with normal chest xray obtained almost two months after order originally placed. HPI and exam strongly suggestive of chest wall pain with probable component of GERD and obese body habitus. No improvement with meloxicam and Flexeril, and little help with omeprazole. Pt has not been evaluated by GI and is hesitant about the idea of possible endoscopy, but is also adamant that "something is wrong" and "it needs to be figured out and not delayed."  P: Referral placed for GI evaluation with consideration for possible scope, especially given component of dysphagia / globus sensation. Continue PPI. Rx for viscous lidocaine and advised trying Maalox or other OTC med in the meantime. Plan to f/u after GI evaluation. See also other problem list note.

## 2013-02-04 NOTE — Assessment & Plan Note (Signed)
Uncertain exact significance and severity (pt is very animated and expansive on number of symptoms, but is difficult to redirect at times and it is hard to gauge severity of symptoms other than "really bad, burning pain"). Pt does endorse occasional globus sensation, sensation of food "catching" in her throat, and has had episodes of regurgitation of small amounts of food (liquids and solids). Referred to GI for consideration of endoscopy, as different medications have been unhelpful and her constellation of symptoms does suggest some esophageal component. F/u with me after that evaluation.

## 2013-02-04 NOTE — Patient Instructions (Signed)
Thank you for coming in, today!  I'm not sure what all is causing your chest pain. I think it could be a combination of reflux, muscle pain, and something in your throat. I want you to be seen by the GI doctors who may want to do an endoscopy (putting a camera down your throat to look at the tissue). In the meantime, you can try using lidocaine by mouth to help numb your throat some. You can also try to take over-the-counter medicines for heartburn like Maalox.  Someone will call you with an appointment at the GI doctor's office. Come back to see me after they see you and after they do something. Please feel free to call with any questions or concerns at any time, at [phone]. --Dr. Casper Harrison'

## 2013-02-04 NOTE — Progress Notes (Signed)
   Subjective:    Patient ID: [name], female    DOB: 1966-02-10, 47 y.o.   MRN: [id number]  HPI: Pt presents to clinic for f/u of continued burning chest pain and "pulling" still present for several months. 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 Pain is "sharp" and pulling, but also burning in nature, intermittent. Pain is increased with talking and with some foods (spicy foods but also some things like mashed potatoes) as well as body movements like moving her arms, reaching above her head. She has been taking Flexeril without much help with the pain (did her sleep), and has been taking omeprazole without relief. Pt does think that something is "going on" with her esophagus, and reports a "catching" globus sensation sometimes when she eats, as well as regurgitation with eating sometiems.  Review of Systems: As above. Does complain of some congestion but no true SOB, fever, abdominal pain. No bloody or dark stools. No vomiting or nausea.     Objective:   Physical Exam BP 139/91  Pulse 78  Temp(Src) 98.4 F (36.9 C) (Oral)  Ht 5' 4.5" (1.638 m) Gen: well-appearing, morbidly obese adult female, in NAD Cardio: RRR, no murmur appreciated  Pulm: CTAB, no wheezes, normal WOB  MSK: tenderness along sternal border, bilaterally  No tenderness in bilateral chest wall otherwise   No shoulder girdle or neck pain / tenderness  Some pain as described in HPI reproducible with palpation, and "catching" sensation with pt reaching forward / up over her head  ROM of upper extremities and movements of torso not limited, but active ROM does increase pain Abd: soft, obese, nontender, BS+     Assessment & Plan:

## 2013-02-11 ENCOUNTER — Ambulatory Visit: Payer: BC Managed Care – PPO | Admitting: Family Medicine

## 2013-03-09 ENCOUNTER — Other Ambulatory Visit: Payer: Self-pay | Admitting: Gastroenterology

## 2013-04-08 ENCOUNTER — Encounter (INDEPENDENT_AMBULATORY_CARE_PROVIDER_SITE_OTHER): Payer: Self-pay | Admitting: Gastroenterology

## 2013-05-08 ENCOUNTER — Other Ambulatory Visit: Payer: Self-pay | Admitting: *Deleted

## 2013-05-08 MED ORDER — OMEPRAZOLE 20 MG PO CPDR: 20.0000 mg | DELAYED_RELEASE_CAPSULE | Freq: Every day | ORAL | Status: DC

## 2013-05-18 ENCOUNTER — Other Ambulatory Visit: Payer: Self-pay | Admitting: Gastroenterology

## 2013-05-18 DIAGNOSIS — R1310 Dysphagia, unspecified: Secondary | ICD-10-CM

## 2013-05-22 ENCOUNTER — Ambulatory Visit
Admission: RE | Admit: 2013-05-22 | Discharge: 2013-05-22 | Disposition: A | Discharge status: Home / Self Care | Payer: BC Managed Care – PPO | Source: Ambulatory Visit | Attending: Gastroenterology | Admitting: Gastroenterology

## 2013-05-22 DIAGNOSIS — R1310 Dysphagia, unspecified: Secondary | ICD-10-CM

## 2013-07-06 ENCOUNTER — Encounter (HOSPITAL_COMMUNITY)
Admission: RE | Disposition: A | Discharge status: Home / Self Care | Payer: Self-pay | Source: Ambulatory Visit | Attending: Gastroenterology

## 2013-07-06 ENCOUNTER — Ambulatory Visit (HOSPITAL_COMMUNITY)
Admission: RE | Admit: 2013-07-06 | Discharge: 2013-07-06 | Disposition: A | Discharge status: Home / Self Care | Payer: BC Managed Care – PPO | Source: Ambulatory Visit | Attending: Gastroenterology | Admitting: Gastroenterology

## 2013-07-06 DIAGNOSIS — R1310 Dysphagia, unspecified: Secondary | ICD-10-CM | POA: Insufficient documentation

## 2013-07-06 HISTORY — PX: ESOPHAGEAL MANOMETRY: SHX5429

## 2013-07-06 SURGERY — MANOMETRY, ESOPHAGUS

## 2013-07-06 MED ORDER — LIDOCAINE VISCOUS 2 % MT SOLN
OROMUCOSAL | Status: AC
Start: 2013-07-06 — End: 2013-07-06
  Filled 2013-07-06: qty 15

## 2013-07-06 SURGICAL SUPPLY — 1 items: FACESHIELD LNG OPTICON STERILE (SAFETY) IMPLANT

## 2013-07-07 ENCOUNTER — Encounter (HOSPITAL_COMMUNITY): Payer: Self-pay | Admitting: Gastroenterology

## 2013-07-16 ENCOUNTER — Ambulatory Visit: Payer: BC Managed Care – PPO | Admitting: Family Medicine

## 2013-11-25 ENCOUNTER — Telehealth: Payer: Self-pay | Admitting: Family Medicine

## 2013-11-25 ENCOUNTER — Ambulatory Visit (INDEPENDENT_AMBULATORY_CARE_PROVIDER_SITE_OTHER): Payer: BC Managed Care – PPO | Admitting: *Deleted

## 2013-11-25 DIAGNOSIS — Z23 Encounter for immunization: Secondary | ICD-10-CM

## 2013-11-25 DIAGNOSIS — Z8543 Personal history of malignant neoplasm of ovary: Secondary | ICD-10-CM

## 2013-11-25 NOTE — Telephone Encounter (Signed)
Reviewed chart. Pt getting yearly CT scans for surveillance s/p surgeries for ovarian cancer. Order placed today; please arrange and call pt with appointment / information about when and where to go. Thanks. --CMS

## 2013-11-25 NOTE — Telephone Encounter (Signed)
Needs referall for yearly CT scan because she had cancer Please advise

## 2013-11-25 NOTE — Telephone Encounter (Signed)
Appt scheduled at [facility] for 9/28 at 130pm, patient informed.

## 2013-11-26 ENCOUNTER — Telehealth: Payer: Self-pay | Admitting: Family Medicine

## 2013-11-26 NOTE — Telephone Encounter (Signed)
Needs to be call ASAP regarding her CT scan. CT should be at [facility] not [place]

## 2013-11-26 NOTE — Telephone Encounter (Signed)
Phone call completed ?

## 2013-11-27 ENCOUNTER — Other Ambulatory Visit (HOSPITAL_COMMUNITY): Payer: BC Managed Care – PPO

## 2013-11-30 ENCOUNTER — Encounter: Payer: Self-pay | Admitting: Family Medicine

## 2013-11-30 ENCOUNTER — Ambulatory Visit (HOSPITAL_COMMUNITY)
Admission: RE | Admit: 2013-11-30 | Discharge: 2013-11-30 | Disposition: A | Discharge status: Home / Self Care | Payer: BC Managed Care – PPO | Source: Ambulatory Visit | Attending: Family Medicine | Admitting: Family Medicine

## 2013-11-30 ENCOUNTER — Ambulatory Visit (HOSPITAL_COMMUNITY): Payer: BC Managed Care – PPO

## 2013-11-30 DIAGNOSIS — K449 Diaphragmatic hernia without obstruction or gangrene: Secondary | ICD-10-CM | POA: Diagnosis not present

## 2013-11-30 DIAGNOSIS — Z90710 Acquired absence of both cervix and uterus: Secondary | ICD-10-CM | POA: Diagnosis not present

## 2013-11-30 DIAGNOSIS — Z9089 Acquired absence of other organs: Secondary | ICD-10-CM | POA: Diagnosis not present

## 2013-11-30 DIAGNOSIS — Z9884 Bariatric surgery status: Secondary | ICD-10-CM | POA: Diagnosis not present

## 2013-11-30 DIAGNOSIS — Q619 Cystic kidney disease, unspecified: Secondary | ICD-10-CM | POA: Insufficient documentation

## 2013-11-30 DIAGNOSIS — I7090 Unspecified atherosclerosis: Secondary | ICD-10-CM | POA: Diagnosis not present

## 2013-11-30 DIAGNOSIS — Z8543 Personal history of malignant neoplasm of ovary: Secondary | ICD-10-CM | POA: Insufficient documentation

## 2013-11-30 MED ORDER — IOHEXOL 300 MG/ML  SOLN: 100.0000 mL | Freq: Once | INTRAMUSCULAR | Status: AC | PRN

## 2014-01-04 ENCOUNTER — Encounter (HOSPITAL_COMMUNITY): Payer: Self-pay | Admitting: Gastroenterology

## 2014-03-01 ENCOUNTER — Other Ambulatory Visit: Payer: Self-pay | Admitting: *Deleted

## 2014-03-01 MED ORDER — HYDROCHLOROTHIAZIDE 25 MG PO TABS: 25.0000 mg | ORAL_TABLET | Freq: Every day | ORAL | Status: DC

## 2014-08-16 ENCOUNTER — Encounter (HOSPITAL_COMMUNITY): Payer: Self-pay | Admitting: Emergency Medicine

## 2014-08-16 ENCOUNTER — Emergency Department (HOSPITAL_COMMUNITY)
Admission: EM | Admit: 2014-08-16 | Discharge: 2014-08-16 | Disposition: A | Discharge status: Home / Self Care | Payer: BC Managed Care – PPO | Source: Home / Self Care | Attending: Family Medicine | Admitting: Family Medicine

## 2014-08-16 DIAGNOSIS — W57XXXA Bitten or stung by nonvenomous insect and other nonvenomous arthropods, initial encounter: Secondary | ICD-10-CM

## 2014-08-16 DIAGNOSIS — T148 Other injury of unspecified body region: Secondary | ICD-10-CM | POA: Diagnosis not present

## 2014-08-16 NOTE — ED Provider Notes (Signed)
CSN: [id number]     Arrival date & time 08/16/14  1723 History   First MD Initiated Contact with Patient 08/16/14 1803     Chief Complaint  Patient presents with  . Insect Bite   (Consider location/radiation/quality/duration/timing/severity/associated sxs/prior Treatment) HPI Comments: 49 year old female noticed a stinging and burning to the right lower leg just over the proximal calf yesterday. Over the past 24 hours there has been increase in size of this annular area of redness with underlying induration. Denies systemic symptoms.   Past Medical History  Diagnosis Date  . Wears glasses   . Sinus complaint   . IBS (irritable bowel syndrome)   . Hyperlipidemia   . Hypertension   . Morbid obesity    Past Surgical History  Procedure Laterality Date  . Cholecystectomy    . Hemorroidectomy    . Abdominal hysterectomy    . Gastric restriction surgery      lap band  . Esophageal manometry N/A 07/06/2013    Procedure: ESOPHAGEAL MANOMETRY (EM);  Surgeon: Arta Silence, MD;  Location: [facility];  Service: Endoscopy;  Laterality: N/A;   Family History  Problem Relation Age of Onset  . Thyroid cancer Mother   . Hypertension Mother   . Hyperlipidemia Mother   . Breast cancer      aunts   History  Substance Use Topics  . Smoking status: Never Smoker   . Smokeless tobacco: Never Used  . Alcohol Use: No   OB History    Gravida Para Term Preterm AB TAB SAB Ectopic Multiple Living   2 2 2       2      Review of Systems  Constitutional: Negative.   HENT: Negative.   Respiratory: Negative.   Cardiovascular: Negative.   Gastrointestinal: Negative.   Musculoskeletal: Negative for myalgias and arthralgias.  Skin: Positive for color change.  Neurological: Negative.     Allergies  Penicillins  Home Medications   Prior to Admission medications   Medication Sig Start Date End Date Taking? Authorizing Provider  cyclobenzaprine (FLEXERIL) 5 MG tablet Take 1 tablet (5 mg  total) by mouth 3 (three) times daily as needed for muscle spasms. 12/03/12   Sharon Mt Street, MD  hydrochlorothiazide (HYDRODIURIL) 25 MG tablet Take 1 tablet (25 mg total) by mouth daily. 03/01/14   Cornfields, MD  lidocaine (XYLOCAINE) 2 % solution Use as directed 20 mLs in the mouth or throat as needed for mouth pain. 02/04/13   Sharon Mt Street, MD  omeprazole (PRILOSEC) 20 MG capsule Take 1 capsule (20 mg total) by mouth daily. 05/08/13   Leeanne Rio, MD   BP 139/94 mmHg  Pulse 76  Temp(Src) 97.4 F (36.3 C) (Oral)  Resp 16  SpO2 97% Physical Exam  Constitutional: She is oriented to person, place, and time. She appears well-developed. No distress.  Neck: Normal range of motion. Neck supple.  Pulmonary/Chest: Effort normal. No respiratory distress.  Musculoskeletal: Normal range of motion.  Neurological: She is alert and oriented to person, place, and time. She exhibits normal muscle tone.  Skin: Skin is warm and dry.  3 cm area of erythema to the right lower leg located over the proximal calf. Mild local swelling and induration beneath the erythema. No lymphangitis. No exudates, draining or bleeding. No open wound. Positive for local tenderness.  Psychiatric: She has a normal mood and affect.  Nursing note and vitals reviewed.   ED Course  Procedures (including critical care time) Labs  Review Labs Reviewed - No data to display  Imaging Review No results found.   MDM   1. Insect bite    Cold compresses 1% hydrocortisone cream Benadryl cream For worsening, fever, increase swelling of leg rechk promptly.    Janne Napoleon, NP 08/16/14 [phone]

## 2014-08-16 NOTE — Discharge Instructions (Signed)
Insect Bite Cold compresses 1% hydrocortisone cream Benadryl cream Mosquitoes, flies, fleas, bedbugs, and many other insects can bite. Insect bites are different from insect stings. A sting is when venom is injected into the skin. Some insect bites can transmit infectious diseases. SYMPTOMS  Insect bites usually turn red, swell, and itch for 2 to 4 days. They often go away on their own. TREATMENT  Your caregiver may prescribe antibiotic medicines if a bacterial infection develops in the bite. HOME CARE INSTRUCTIONS  Do not scratch the bite area.  Keep the bite area clean and dry. Wash the bite area thoroughly with soap and water.  Put ice or cool compresses on the bite area.  Put ice in a plastic bag.  Place a towel between your skin and the bag.  Leave the ice on for 20 minutes, 4 times a day for the first 2 to 3 days, or as directed.  You may apply a baking soda paste, cortisone cream, or calamine lotion to the bite area as directed by your caregiver. This can help reduce itching and swelling.  Only take over-the-counter or prescription medicines as directed by your caregiver.  If you are given antibiotics, take them as directed. Finish them even if you start to feel better. You may need a tetanus shot if:  You cannot remember when you had your last tetanus shot.  You have never had a tetanus shot.  The injury broke your skin. If you get a tetanus shot, your arm may swell, get red, and feel warm to the touch. This is common and not a problem. If you need a tetanus shot and you choose not to have one, there is a rare chance of getting tetanus. Sickness from tetanus can be serious. SEEK IMMEDIATE MEDICAL CARE IF:   You have increased pain, redness, or swelling in the bite area.  You see a red line on the skin coming from the bite.  You have a fever.  You have joint pain.  You have a headache or neck pain.  You have unusual weakness.  You have a rash.  You have  chest pain or shortness of breath.  You have abdominal pain, nausea, or vomiting.  You feel unusually tired or sleepy. MAKE SURE YOU:   Understand these instructions.  Will watch your condition.  Will get help right away if you are not doing well or get worse. Document Released: 03/29/2004 Document Revised: 05/14/2011 Document Reviewed: 09/20/2010 [facility] Patient Information 2015 [place], [place]. This information is not intended to replace advice given to you by your health care provider. Make sure you discuss any questions you have with your health care provider.

## 2014-08-16 NOTE — ED Notes (Signed)
Pt states that she believes she was bit by a insect on her right leg

## 2014-11-16 ENCOUNTER — Ambulatory Visit (INDEPENDENT_AMBULATORY_CARE_PROVIDER_SITE_OTHER): Payer: BC Managed Care – PPO | Admitting: *Deleted

## 2014-11-16 ENCOUNTER — Encounter: Payer: Self-pay | Admitting: *Deleted

## 2014-11-16 ENCOUNTER — Telehealth: Payer: Self-pay | Admitting: *Deleted

## 2014-11-16 DIAGNOSIS — Z23 Encounter for immunization: Secondary | ICD-10-CM | POA: Diagnosis not present

## 2014-11-16 NOTE — Telephone Encounter (Signed)
[name] called and left a message this am at 10:12 and  states she thinks she just missed someone's return call.

## 2014-11-16 NOTE — Telephone Encounter (Signed)
Pt left lengthy message stating that she had surgery with Dr. Gala Romney in 2007 and had cancer. She was also seen at the [facility]. She was recommended to have yearly CAT scans and Pap smears which she has done. After her last Cat scan a year ago she received a letter from the doctor stating that she may want to discuss the need for these annual scans with her cancer doctors. Pt is now calling with this question. Per chart review, notes in her EMR only date back to 2008 and this RN cannot find notes form Dr. Gala Romney or other providers in 2007 at the time of and following her surgery. Pt had Gyn follow up exam in this office on 06/22/11 and there is a note from Dr. Verdie Drown. It states that Dr. Clarene Essex from [facility] had recommended pt to have yearly Cat scans to check for metastases and biannual pelvic exam. I called pt and left message stating that I am calling to discuss her concern. Please call back and leave a message stating whether a detailed message can be left on her voice mail.  *Pt should be advised that we recommend she call Dr. Prudy Feeler office with her question. He may want to schedule appt for consultation.

## 2014-11-16 NOTE — Telephone Encounter (Signed)
Called patient back stating I am returning your phone call. Patient states her primary care suggested she contact us to see if she needs to continue to have yearly CT scans. Asked patient who she saw for oncology. Patient is adamant that she only saw Dr Gala Romney and no one else and that she never saw a cancer doctor. Recommend to patient she contact our [place] office as that is where Dr Gala Romney usually works. She can see Dr Gala Romney who can do a well woman check up and she can discuss her concerns at that time. Patient verbalized understanding. Provided number and name of [place] office to patient. Patient verbalized understanding and had no questions

## 2014-11-26 ENCOUNTER — Ambulatory Visit (INDEPENDENT_AMBULATORY_CARE_PROVIDER_SITE_OTHER): Payer: BC Managed Care – PPO | Admitting: Internal Medicine

## 2014-11-26 ENCOUNTER — Encounter: Payer: Self-pay | Admitting: Internal Medicine

## 2014-11-26 ENCOUNTER — Other Ambulatory Visit: Payer: Self-pay | Admitting: Internal Medicine

## 2014-11-26 VITALS — BP 159/101 | Temp 98.1°F | Ht 64.0 in | Wt 313.0 lb

## 2014-11-26 DIAGNOSIS — E669 Obesity, unspecified: Secondary | ICD-10-CM

## 2014-11-26 DIAGNOSIS — R5382 Chronic fatigue, unspecified: Secondary | ICD-10-CM | POA: Diagnosis not present

## 2014-11-26 DIAGNOSIS — R5383 Other fatigue: Secondary | ICD-10-CM | POA: Insufficient documentation

## 2014-11-26 DIAGNOSIS — I10 Essential (primary) hypertension: Secondary | ICD-10-CM

## 2014-11-26 MED ORDER — HYDROCHLOROTHIAZIDE 25 MG PO TABS: 25.0000 mg | ORAL_TABLET | Freq: Every day | ORAL | Status: DC

## 2014-11-26 NOTE — Assessment & Plan Note (Signed)
-   Given patient's described sleeping habits, most likely due to inadequate amount of sleep - Pt says she has already had a sleep study and will not have another performed - Cannot find record of sleep study in our EHR

## 2014-11-26 NOTE — Assessment & Plan Note (Signed)
Declined to be weighed today.

## 2014-11-26 NOTE — Patient Instructions (Signed)

## 2014-11-26 NOTE — Progress Notes (Signed)
   Subjective:    Patient ID: [name], female    DOB: 25-Sep-1965, 49 y.o.   MRN: [id number]  HPI  Chief Complaint  Patient presents with  . Follow-up   [name] is a 49 yo F with PMH of HTN and ovarian malignancy presenting today with complaints of tiredness.   She reports that she is usually tired now, and believes she has a thyroid problem. She reports that she does not sleep throughout the night, and thus does not get many hours of sleep per night. She cannot quantify how many hours of sleep per night she actually receives. She denies weight gain, cold intolerance, or hair loss. She endorses one patch of dry skin on each hand.  Of note, [name] became very agitated and aggressive during our interview. She acted similarly with her nurse before our encounter. She was initially very pleasant, even laughing at times, but became suddenly very angry, raising her voice, when I explained to her that I did not think there was a medical indication to draw a TSH level today. I explained that I felt her symptoms were due more to her sleeping habits and that a sleep study would be more useful, and she then demanded that I order TSH labs today. She said, "I am the patient and I want the lab, so you have to do it." When I informed her that this is not how we operate in our clinic, she became even more agitated and told me that "we will not work out and I am going to ask to see another doctor."    Review of Systems  Constitutional: Positive for fatigue.  HENT: Positive for postnasal drip. Negative for trouble swallowing.   Endocrine: Negative for cold intolerance.  Psychiatric/Behavioral: Positive for sleep disturbance.       Objective:   Physical Exam  Constitutional: She is oriented to person, place, and time.  Obese  HENT:  Head: Normocephalic and atraumatic.  Cardiovascular: Normal rate.   Pulmonary/Chest: Effort normal. No respiratory distress.  Neurological: She is alert and  oriented to person, place, and time. No cranial nerve deficit.  Psychiatric:  Labile mood (initially pleasant and laughing then quickly aggressive and agitated)  Vitals reviewed.      Assessment & Plan:  HYPERTENSION, BENIGN SYSTEMIC - BPs 159/101 and 140/100 at appt today - Pt reports taking 25 mg HCTZ "most days" - Recommended increased HCTZ dose for patient's uncontrolled HTN, but she refused. Recommended patient return for a BP check in a few days, which she also refused.   Obesity Declined to be weighed today.  Feeling tired - Given patient's described sleeping habits, most likely due to inadequate amount of sleep - Pt says she has already had a sleep study and will not have another performed - Cannot find record of sleep study in our EHR

## 2014-11-26 NOTE — Assessment & Plan Note (Signed)
-   BPs 159/101 and 140/100 at appt today - Pt reports taking 25 mg HCTZ "most days" - Recommended increased HCTZ dose for patient's uncontrolled HTN, but she refused. Recommended patient return for a BP check in a few days, which she also refused.

## 2014-12-14 ENCOUNTER — Ambulatory Visit (INDEPENDENT_AMBULATORY_CARE_PROVIDER_SITE_OTHER): Payer: BC Managed Care – PPO | Admitting: Obstetrics & Gynecology

## 2014-12-14 ENCOUNTER — Encounter: Payer: Self-pay | Admitting: Obstetrics & Gynecology

## 2014-12-14 VITALS — BP 125/91 | HR 85 | Resp 16 | Ht 64.5 in | Wt 327.0 lb

## 2014-12-14 DIAGNOSIS — I10 Essential (primary) hypertension: Secondary | ICD-10-CM

## 2014-12-14 DIAGNOSIS — Z9889 Other specified postprocedural states: Secondary | ICD-10-CM

## 2014-12-14 DIAGNOSIS — Z86018 Personal history of other benign neoplasm: Secondary | ICD-10-CM | POA: Insufficient documentation

## 2014-12-14 LAB — LIPID PANEL
CHOLESTEROL: 211 mg/dL — AB (ref 125–200)
HDL: 63 mg/dL (ref >=46)
LDL CALC: 121 mg/dL (ref <130)
TRIGLYCERIDES: 135 mg/dL (ref <150)
Total CHOL/HDL Ratio: 3.3 Ratio (ref <=5.0)
VLDL: 27 mg/dL (ref <30)

## 2014-12-14 LAB — CBC
HCT: 39.2 % (ref 36.0–46.0)
Hemoglobin: 13 g/dL (ref 12.0–15.0)
MCH: 28 pg (ref 26.0–34.0)
MCHC: 33.2 g/dL (ref 30.0–36.0)
MCV: 84.3 fL (ref 78.0–100.0)
MPV: 8.9 fL (ref 8.6–12.4)
PLATELETS: 354 10*3/uL (ref 150–400)
RBC: 4.65 MIL/uL (ref 3.87–5.11)
RDW: 14.2 % (ref 11.5–15.5)
WBC: 7.8 10*3/uL (ref 4.0–10.5)

## 2014-12-14 LAB — COMPREHENSIVE METABOLIC PANEL
ALBUMIN: 4.2 g/dL (ref 3.6–5.1)
ALT: 17 U/L (ref 6–29)
AST: 21 U/L (ref 10–35)
Alkaline Phosphatase: 91 U/L (ref 33–130)
BUN: 15 mg/dL (ref 7–25)
CHLORIDE: 99 mmol/L (ref 98–110)
CO2: 33 mmol/L — AB (ref 20–31)
Calcium: 9.2 mg/dL (ref 8.6–10.4)
Creat: 0.54 mg/dL (ref 0.50–1.05)
Glucose, Bld: 76 mg/dL (ref 65–99)
POTASSIUM: 4.1 mmol/L (ref 3.5–5.3)
Sodium: 141 mmol/L (ref 135–146)
TOTAL PROTEIN: 7 g/dL (ref 6.1–8.1)
Total Bilirubin: 0.7 mg/dL (ref 0.2–1.2)
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
## 2014-12-15 ENCOUNTER — Other Ambulatory Visit: Payer: Self-pay | Admitting: Obstetrics & Gynecology

## 2014-12-15 ENCOUNTER — Telehealth: Payer: Self-pay | Admitting: *Deleted

## 2014-12-15 LAB — HEMOGLOBIN A1C
Hgb A1c MFr Bld: 5.5 % (ref <5.7)
Mean Plasma Glucose: 111 mg/dL (ref <117)

## 2014-12-15 LAB — VITAMIN D 25 HYDROXY (VIT D DEFICIENCY, FRACTURES): VIT D 25 HYDROXY: 20 ng/mL — AB (ref 30–100)

## 2014-12-15 LAB — TSH: TSH: 1.168 u[IU]/mL (ref 0.350–4.500)

## 2014-12-15 MED ORDER — CALCIUM CARB-CHOLECALCIFEROL 600-800 MG-UNIT PO TABS
1.0000 | ORAL_TABLET | Freq: Every morning | ORAL | Status: DC
Start: 2014-12-15 — End: 2016-08-05

## 2014-12-15 NOTE — Telephone Encounter (Signed)
Pt notified of low Vitamin D level and to be taking at least 80mg  of Vitamin D daily and recheck level in 3 months per Dr Gala Romney.

## 2014-12-15 NOTE — Progress Notes (Signed)
Low Vit D Supplement with 800 units vitamin D dily and recheck level in 3 months.

## 2014-12-16 NOTE — Telephone Encounter (Signed)
That was 800 mg of Vitamin D, the 80 mg was a typo.

## 2014-12-20 ENCOUNTER — Telehealth: Payer: Self-pay | Admitting: *Deleted

## 2014-12-20 NOTE — Telephone Encounter (Signed)
LM on voicemail that she does not need yearly CT's per GYN [place].

## 2014-12-20 NOTE — Telephone Encounter (Signed)
-----   Message from Guss Bunde, MD sent at 12/20/2014 10:33 AM EDT ----- Gertie Exon no longer needs yeary CTs.  Can you call patient (copy email below into note) and inform her.  I know she will be thrilled!!   ----- Message -----    From: Nancy Marus, MD    Sent: 12/20/2014   8:34 AM      To: Guss Bunde, MD  Dear Claiborne Billings It has been 9 years since her diagnosis. I do not think that she needs yearly CTs. I would do them only based on symptoms. Thanks [employer]

## 2015-03-08 ENCOUNTER — Telehealth: Payer: Self-pay | Admitting: Internal Medicine

## 2015-03-08 NOTE — Telephone Encounter (Signed)
Pt states she put in a request in sept 2016 that she did not want dr Avon Gully to be her dr. Please advise

## 2015-03-08 NOTE — Telephone Encounter (Signed)
Her medical records show she is overdue for colonscopy, hep C, tetanus shot. She wonders if she needs a dr to do the referrals and if she doesn't want dr Avon Gully to be her dr., she doesn't know what to do.

## 2015-03-16 ENCOUNTER — Other Ambulatory Visit: Payer: Self-pay

## 2015-03-16 DIAGNOSIS — Z1231 Encounter for screening mammogram for malignant neoplasm of breast: Secondary | ICD-10-CM

## 2015-03-18 ENCOUNTER — Encounter: Payer: Self-pay | Admitting: Family Medicine

## 2015-03-18 ENCOUNTER — Ambulatory Visit (INDEPENDENT_AMBULATORY_CARE_PROVIDER_SITE_OTHER): Payer: BC Managed Care – PPO | Admitting: Family Medicine

## 2015-03-18 VITALS — BP 134/88 | HR 93 | Temp 98.0°F | Wt 321.5 lb

## 2015-03-18 DIAGNOSIS — Z0000 Encounter for general adult medical examination without abnormal findings: Secondary | ICD-10-CM | POA: Diagnosis not present

## 2015-03-18 DIAGNOSIS — E669 Obesity, unspecified: Secondary | ICD-10-CM | POA: Diagnosis not present

## 2015-03-18 NOTE — Patient Instructions (Signed)
It was a pleasure seeing you today in our clinic. Today we discussed your health maintenance tests. Here is the treatment plan we have discussed and agreed upon together:   - I would like for you to schedule an appointment to have a colonoscopy in the next month or 2. - Today we obtained blood to test you for hepatitis C due to your age-related risk factors. I will mail you the results of this test. - I placed a referral for nutritional counseling through the [facility] program. You'll be contacted once this paperwork process.

## 2015-03-18 NOTE — Assessment & Plan Note (Addendum)
Patient is due for hepatitis C antibody screen. She is not currently up-to-date with all her vaccines. Tdap not currently available in our office. Patient has been asked to come back in one week for a nurse's appointment to receive the vaccine. Patient is due for mammography and colonoscopy. She states that she has a mammogram scheduled for next week. I've provided information for a colonoscopy and have asked her to contact the GI's office to have colonoscopy scheduled. - This was not an annual wellness visit; patient merely scheduled this appointment to discuss these issues.

## 2015-03-18 NOTE — Progress Notes (Signed)
   HPI  CC: weight loss.  Patient is here with concerns of her recent weight gain. She states that she has been gaining weight over the past few months and would like to get some help with this. Diet is fairly excessive at this time. She states that she previously had a laparoscopic band placed but it has not done much good. Denies any fevers chills sweats cold/heat intolerance nausea vomiting diarrhea or constipation.  Patient will also like to discuss some of her health maintenance issues. She states that she has never had a screening for hepatitis C although she falls within the age group which this screening is recommended. She also is not up-to-date on her tdap vaccine. She also asked that I check her status of her colonoscopy screenings. She believes that she is due to have a repeat study.  Review of Systems   See HPI for ROS. All other systems reviewed and are negative.  Past medical history and social history reviewed and updated in the EMR as appropriate.  Objective: BP 134/88 mmHg  Pulse 93  Temp(Src) 98 F (36.7 C) (Oral)  Wt 321 lb 8 oz (145.831 kg) Gen: NAD, alert, cooperative CV: RRR, no murmur Resp: CTAB, no wheezes, non-labored Abd: SNTND, Obese, BS present, no guarding or organomegaly   Assessment and plan:  Healthcare maintenance Patient is due for hepatitis C antibody screen. She is not currently up-to-date with all her vaccines. Tdap not currently available in our office. Patient has been asked to come back in one week for a nurse's appointment to receive the vaccine. Patient is due for mammography and colonoscopy. She states that she has a mammogram scheduled for next week. I've provided information for a colonoscopy and have asked her to contact the GI's office to have colonoscopy scheduled. - This was not an annual wellness visit; patient merely scheduled this appointment to discuss these issues.  Obesity Obesity, class III (Worsening) Patient is currently  complaining of increasing weight. Diet appears to be poor. Appetite excessive. Minimal exercising. History of laparoscopic band per paient. Patient's most recent lab studies (October/2016) showed serum glucose within normal range (76). Patient's serum glucose seems to always be within normal range making recent progression into type 2 diabetes less likely. - Referral placed for dietetic/nutrition services. Patient requested being sent to [facility].  - Will continue to monitor.    Orders Placed This Encounter  Procedures  . Hepatitis C antibody  . Amb ref to Medical Nutrition Therapy-MNT    Referral Priority:  Routine    Referral Type:  Consultation    Referral Reason:  Specialty Services Required    Requested Specialty:  Nutrition    Number of Visits Requested:  1     Elberta Leatherwood, MD,MS,  PGY2 03/18/2015 6:31 PM

## 2015-03-18 NOTE — Assessment & Plan Note (Signed)
Obesity, class III (Worsening) Patient is currently complaining of increasing weight. Diet appears to be poor. Appetite excessive. Minimal exercising. History of laparoscopic band per paient. Patient's most recent lab studies (October/2016) showed serum glucose within normal range (76). Patient's serum glucose seems to always be within normal range making recent progression into type 2 diabetes less likely. - Referral placed for dietetic/nutrition services. Patient requested being sent to [facility].  - Will continue to monitor.

## 2015-03-19 LAB — HEPATITIS C ANTIBODY: HCV AB: REACTIVE — AB

## 2015-03-21 ENCOUNTER — Ambulatory Visit
Admission: RE | Admit: 2015-03-21 | Discharge: 2015-03-21 | Disposition: A | Discharge status: Home / Self Care | Payer: BC Managed Care – PPO | Source: Ambulatory Visit

## 2015-03-21 DIAGNOSIS — Z1231 Encounter for screening mammogram for malignant neoplasm of breast: Secondary | ICD-10-CM

## 2015-03-21 LAB — HEPATITIS C RNA QUANTITATIVE: HCV QUANT: NOT DETECTED [IU]/mL (ref <15)

## 2015-03-28 ENCOUNTER — Other Ambulatory Visit: Payer: Self-pay | Admitting: Family Medicine

## 2015-03-28 MED ORDER — HYDROCHLOROTHIAZIDE 25 MG PO TABS: 25.0000 mg | ORAL_TABLET | Freq: Every day | ORAL | Status: DC

## 2015-03-29 ENCOUNTER — Ambulatory Visit: Payer: BC Managed Care – PPO

## 2015-03-31 ENCOUNTER — Ambulatory Visit (INDEPENDENT_AMBULATORY_CARE_PROVIDER_SITE_OTHER): Payer: BC Managed Care – PPO | Admitting: *Deleted

## 2015-03-31 DIAGNOSIS — Z23 Encounter for immunization: Secondary | ICD-10-CM | POA: Diagnosis not present

## 2015-03-31 NOTE — Progress Notes (Signed)
    [name] presents for immunizations.    Screening questions for immunizations: 1. Are you sick today?  no 2. Do you have allergies to medications, foods, or any vaccines?  no 3. Have you ever had a serious reaction after receiving a vaccination?  no 4. Do you have a long-term health problem with heart disease, asthma, lung disease, kidney disease, metabolic disease (e.g. diabetes), anemia, or other blood disorder?  no 5. Have you had a seizure, brain problem, or other nervous system problem?  no 6. Do you have cancer, leukemia, AIDS, or any other immune system problem?  no 7. Do you take cortisone, prednisone, other steroids, anticancer drugs or have you had radiation treatments?  no 8. Have you received a transfusion of blood or blood products, or been given immune (gamma) globulin or an antiviral drug in the past year?  no 9. Have you received vaccinations in the past 4 weeks?  no 10. FEMALES ONLY: Are you pregnant or is there a chance you could become pregnant during the next month?  no   Derl Barrow, RN

## 2015-04-06 ENCOUNTER — Encounter: Payer: Self-pay | Admitting: Gastroenterology

## 2015-04-18 DIAGNOSIS — K219 Gastro-esophageal reflux disease without esophagitis: Secondary | ICD-10-CM | POA: Insufficient documentation

## 2015-12-08 ENCOUNTER — Ambulatory Visit: Payer: BC Managed Care – PPO

## 2015-12-12 ENCOUNTER — Ambulatory Visit (INDEPENDENT_AMBULATORY_CARE_PROVIDER_SITE_OTHER): Payer: BC Managed Care – PPO | Admitting: *Deleted

## 2015-12-12 DIAGNOSIS — Z23 Encounter for immunization: Secondary | ICD-10-CM | POA: Diagnosis not present

## 2016-02-09 ENCOUNTER — Ambulatory Visit (INDEPENDENT_AMBULATORY_CARE_PROVIDER_SITE_OTHER): Payer: BC Managed Care – PPO | Admitting: Family Medicine

## 2016-02-09 ENCOUNTER — Encounter: Payer: Self-pay | Admitting: Family Medicine

## 2016-02-09 VITALS — BP 144/92 | HR 67 | Temp 98.3°F | Ht 64.5 in | Wt 320.2 lb

## 2016-02-09 DIAGNOSIS — R635 Abnormal weight gain: Secondary | ICD-10-CM | POA: Diagnosis not present

## 2016-02-09 DIAGNOSIS — R1013 Epigastric pain: Secondary | ICD-10-CM

## 2016-02-09 DIAGNOSIS — IMO0001 Reserved for inherently not codable concepts without codable children: Secondary | ICD-10-CM

## 2016-02-09 DIAGNOSIS — Z6841 Body Mass Index (BMI) 40.0 and over, adult: Secondary | ICD-10-CM | POA: Diagnosis not present

## 2016-02-09 DIAGNOSIS — E6609 Other obesity due to excess calories: Secondary | ICD-10-CM

## 2016-02-09 LAB — BASIC METABOLIC PANEL WITH GFR
BUN: 10 mg/dL (ref 7–25)
CALCIUM: 9.5 mg/dL (ref 8.6–10.4)
CO2: 32 mmol/L — AB (ref 20–31)
Chloride: 102 mmol/L (ref 98–110)
Creat: 0.62 mg/dL (ref 0.50–1.05)
Glucose, Bld: 80 mg/dL (ref 65–99)
Potassium: 3.7 mmol/L (ref 3.5–5.3)
SODIUM: 142 mmol/L (ref 135–146)

## 2016-02-09 LAB — CBC
HCT: 39.8 % (ref 35.0–45.0)
HEMOGLOBIN: 12.8 g/dL (ref 11.7–15.5)
MCH: 27.9 pg (ref 27.0–33.0)
MCHC: 32.2 g/dL (ref 32.0–36.0)
MCV: 86.7 fL (ref 80.0–100.0)
MPV: 8.6 fL (ref 7.5–12.5)
Platelets: 336 10*3/uL (ref 140–400)
RBC: 4.59 MIL/uL (ref 3.80–5.10)
RDW: 14 % (ref 11.0–15.0)
WBC: 7.3 10*3/uL (ref 3.8–10.8)

## 2016-02-09 LAB — LIPID PANEL
CHOLESTEROL: 257 mg/dL — AB (ref <200)
HDL: 69 mg/dL (ref >50)
LDL CALC: 173 mg/dL — AB (ref <100)
TRIGLYCERIDES: 73 mg/dL (ref <150)
Total CHOL/HDL Ratio: 3.7 Ratio (ref <5.0)
VLDL: 15 mg/dL (ref <30)

## 2016-02-09 MED ORDER — PANTOPRAZOLE SODIUM 40 MG PO TBEC: 40.0000 mg | DELAYED_RELEASE_TABLET | Freq: Every day | ORAL | 1 refills | Status: DC

## 2016-02-09 NOTE — Progress Notes (Signed)
   HPI  CC: Abdominal pain Patient states that her abdominal pain started sometime last week. Since that time she has been unable to initially eat entire meal. Dorsal some diarrhea but no vomiting. No fevers or chills. Fairly persistent nausea with epigastric tenderness. She denies significant alcohol use. She is able to drink appropriately.   At this time she states that most for symptoms have resolved. She still has some minor epigastric tenderness and "fluttering".  Review of Systems    See HPI for ROS. All other systems reviewed and are negative.  CC, SH/smoking status, and VS noted  Objective: BP (!) 144/92   Pulse 67   Temp 98.3 F (36.8 C) (Oral)   Ht 5' 4.5" (1.638 m)   Wt (!) 320 lb 3.2 oz (145.2 kg)   BMI 54.11 kg/m  Gen: NAD, alert, cooperative, and pleasant. CV: RRR, no murmur Resp: CTAB, no wheezes, non-labored Abd: S, mild epigastric tenderness, ND, BS present, no guarding or organomegaly   Assessment and plan:  Abdominal pain, epigastric Patient is here with mild epigastric pain. Etiology currently unknown however differential includes GERD versus viral gastritis versus anxiety. - We'll attempt a trial of a PPI for one month. - After 1 month patient is to come back for reevaluation if discomfort is still present; if pain relief is obtained with PPI than attempt a vacation off of PPI after 1 month. - If no improvement then consider anxiety as source of discomfort (due to "fluttering" sensation)  Obesity Discussed obesity. Seems to be improved with BMI down and entire point. - Obtain labs today. - Discussed weight loss   Orders Placed This Encounter  Procedures  . CBC  . BASIC METABOLIC PANEL WITH GFR  . Lipid panel    Meds ordered this encounter  Medications  . pantoprazole (PROTONIX) 40 MG tablet    Sig: Take 1 tablet (40 mg total) by mouth daily.    Dispense:  30 tablet    Refill:  1     Elberta Leatherwood, MD,MS,  PGY3 02/09/2016 12:38 PM

## 2016-02-09 NOTE — Assessment & Plan Note (Signed)
Patient is here with mild epigastric pain. Etiology currently unknown however differential includes GERD versus viral gastritis versus anxiety. - We'll attempt a trial of a PPI for one month. - After 1 month patient is to come back for reevaluation if discomfort is still present; if pain relief is obtained with PPI than attempt a vacation off of PPI after 1 month. - If no improvement then consider anxiety as source of discomfort (due to "fluttering" sensation)

## 2016-02-09 NOTE — Patient Instructions (Signed)
It was a pleasure seeing you today in our clinic. Today we discussed your abdominal pain. Here is the treatment plan we have discussed and agreed upon together:   - I started you on a proton pump inhibitor called Protonix. Take 1 tablet every day for the next one month. You should have some improvement in this abdominal pain over the next week. If your pain persists or gets any worse come back and see me at or within the next 2 weeks. - We are also going to get some labs on you today.

## 2016-02-09 NOTE — Assessment & Plan Note (Signed)
Discussed obesity. Seems to be improved with BMI down and entire point. - Obtain labs today. - Discussed weight loss

## 2016-02-10 ENCOUNTER — Encounter: Payer: Self-pay | Admitting: Family Medicine

## 2016-04-07 ENCOUNTER — Other Ambulatory Visit: Payer: Self-pay | Admitting: Family Medicine

## 2016-04-17 ENCOUNTER — Other Ambulatory Visit: Payer: Self-pay | Admitting: Family Medicine

## 2016-05-17 ENCOUNTER — Encounter: Payer: BC Managed Care – PPO | Attending: Surgery | Admitting: Skilled Nursing Facility1

## 2016-05-17 ENCOUNTER — Encounter: Payer: Self-pay | Admitting: Skilled Nursing Facility1

## 2016-05-17 DIAGNOSIS — Z9884 Bariatric surgery status: Secondary | ICD-10-CM | POA: Diagnosis not present

## 2016-05-17 DIAGNOSIS — Z713 Dietary counseling and surveillance: Secondary | ICD-10-CM | POA: Diagnosis present

## 2016-05-17 DIAGNOSIS — IMO0001 Reserved for inherently not codable concepts without codable children: Secondary | ICD-10-CM

## 2016-05-17 NOTE — Progress Notes (Signed)
  Primary concerns today: Post-operative Bariatric Surgery Nutrition Management. Pt states she got the lap band in 2010. Pt states she recently got 1 cc taken out. Pt states her surgeon referred her for a refresher of the Lap band diet. 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 Pt states when she eats or drinks her throat closes up. Pt states she was taken protonix which helped and is now taken another acid pill that is working. Pt states she has to use her finger to cause herself to throw-up: doing this less often than before due to taking 1 cc out. Pt states mashed potatoes and ice cream help her throat feel better. Pt states her starting wt was about 335 pounds down to her lowest at 291 pounds and now up to her current wt of 325.9 pounds.  Pt spent the appointment venting about life stresses and explaining why she regained the wt. Pt states she just wants the symptoms of her lapband to subside.  Focus of the appointment was symptom management.  To work on next time: food choices. Goals: -Aim to chew your foods until applesauce consistency  -Take bites the size of a dime or thumb nail and chew that until applesauce  -Try alkaline water pH of 8.8 -Moisten all of your foods: skinny girl dressings, mustard, vinegar, [place], soup -Try Kefir  24-hr recall: B (AM): Snk (AM): L (PM): Snk (PM):  D (PM):  Snk (PM): ice cream   Fluid intake: chocolate fairlife, carbonated sugar free/9 grams sugar

## 2016-05-17 NOTE — Patient Instructions (Addendum)
-  Aim to chew your foods until applesauce consistency   -Take bites the size of a dime or thumb nail and chew that until applesauce   -Try alkaline water pH of 8.8  -Moisten all of your foods: skinny girl dressings, mustard, vinegar, [place], soup  -Try Kefir

## 2016-06-04 ENCOUNTER — Ambulatory Visit: Payer: BC Managed Care – PPO

## 2016-06-19 ENCOUNTER — Encounter: Payer: BC Managed Care – PPO | Attending: Surgery | Admitting: Skilled Nursing Facility1

## 2016-06-19 ENCOUNTER — Encounter: Payer: Self-pay | Admitting: Skilled Nursing Facility1

## 2016-06-19 DIAGNOSIS — Z713 Dietary counseling and surveillance: Secondary | ICD-10-CM | POA: Diagnosis not present

## 2016-06-19 DIAGNOSIS — E669 Obesity, unspecified: Secondary | ICD-10-CM

## 2016-06-19 DIAGNOSIS — Z9884 Bariatric surgery status: Secondary | ICD-10-CM | POA: Diagnosis not present

## 2016-06-19 NOTE — Progress Notes (Signed)
  Primary concerns today: Post-operative Bariatric Surgery Nutrition Management. Pt states she got the lap band in 2010. Pt states she recently got 1 cc taken out. Pt states her surgeon referred her for a refresher of the Lap band diet. 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 Pt states when she eats or drinks her throat closes up. Pt states she was taken protonix which helped and is now taken another acid pill that is working. Pt states she has to use her finger to cause herself to throw-up: doing this less often than before due to taking 1 cc out. Pt states mashed potatoes and ice cream help her throat feel better. Pt states her starting wt was about 335 pounds down to her lowest at 291 pounds and now up to her current wt of 325.9 pounds.  Pt spent the appointment venting about life stresses and explaining why she regained the wt. Pt states she just wants the symptoms of her lapband to subside.  Focus of the appointment was symptom management.  To work on next time: food choices. Goals: -Aim to chew your foods until applesauce consistency  -Take bites the size of a dime or thumb nail and chew that until applesauce  -Try alkaline water pH of 8.8 -Moisten all of your foods: skinny girl dressings, mustard, vinegar, [place], soup -Try Kefir   Pt arrives stating she has npt done anything in the way of healthy eating. 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 Pt state she could not keep food on her stomach due to grief. Pt is a Animal nutritionist. Pt states the pH water has really worked for her GERD symptoms. Pt states she is almost cleaned the room to get to her treadmill.   Goals: -Aim to chew your foods until applesauce consistency -Take bites the size of a dime or thumb nail and chew that until applesauce  -Finish up your workout room and getting the stationary bike in your room    24-hr recall: B  (AM): plain kefir with fruit as a smoothie Snk (AM): L (PM): Snk (PM):  D (PM):  Snk (PM): ice cream   Fluid intake: chocolate fairlife, carbonated sugar free/9 grams sugar, premier protein shake

## 2016-06-19 NOTE — Patient Instructions (Addendum)
-  Aim to chew your foods until applesauce consistency  -Take bites the size of a dime or thumb nail and chew that until applesauce   -Finish up your workout room and getting the stationary bike in your room

## 2016-06-20 ENCOUNTER — Other Ambulatory Visit: Payer: Self-pay | Admitting: Gastroenterology

## 2016-07-15 ENCOUNTER — Other Ambulatory Visit: Payer: Self-pay | Admitting: Internal Medicine

## 2016-07-26 ENCOUNTER — Ambulatory Visit: Payer: BC Managed Care – PPO | Admitting: Skilled Nursing Facility1

## 2016-08-05 ENCOUNTER — Ambulatory Visit (HOSPITAL_COMMUNITY)
Admission: EM | Admit: 2016-08-05 | Discharge: 2016-08-05 | Disposition: A | Discharge status: Home / Self Care | Payer: BC Managed Care – PPO | Attending: Internal Medicine | Admitting: Internal Medicine

## 2016-08-05 ENCOUNTER — Encounter (HOSPITAL_COMMUNITY): Payer: Self-pay | Admitting: Emergency Medicine

## 2016-08-05 DIAGNOSIS — M25572 Pain in left ankle and joints of left foot: Secondary | ICD-10-CM

## 2016-08-05 DIAGNOSIS — M25472 Effusion, left ankle: Secondary | ICD-10-CM

## 2016-08-05 MED ORDER — NAPROXEN 375 MG PO TABS: 375.0000 mg | ORAL_TABLET | Freq: Two times a day (BID) | ORAL | 0 refills | Status: DC

## 2016-08-05 MED ORDER — FUROSEMIDE 40 MG PO TABS: 40.0000 mg | ORAL_TABLET | ORAL | 0 refills | Status: DC

## 2016-08-05 MED ORDER — POTASSIUM CHLORIDE ER 10 MEQ PO TBCR: 10.0000 meq | EXTENDED_RELEASE_TABLET | ORAL | 0 refills | Status: DC

## 2016-08-05 NOTE — ED Triage Notes (Signed)
The patient presented to the [facility] with a complaint of left foot pain and swelling x 3 weeks. The patient denied any known injury.

## 2016-08-05 NOTE — ED Provider Notes (Signed)
[place]    CSN: [id number] Arrival date & time: 08/05/16  1630     History   Chief Complaint Chief Complaint  Patient presents with  . Foot Pain    HPI [name] is a 51 y.o. female. She is having a lot of difficulty with swelling in the lateral aspect of the left ankle and dorsum of the foot, and pain with weightbearing. This been going on for 3 or 4 weeks, no focal injury. She does have history of injuring this ankle in the distant past. She has not taken anything for pain. She has a history of hypertension. No fever, no malaise. Feels okay otherwise.    HPI  Past Medical History:  Diagnosis Date  . Hyperlipidemia   . Hypertension   . IBS (irritable bowel syndrome)   . Morbid obesity ([place])   . Sinus complaint   . Wears glasses     Patient Active Problem List   Diagnosis Date Noted  . Abdominal pain, epigastric 02/09/2016  . Healthcare maintenance 03/18/2015  . 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 Feeling tired 11/26/2014  . Dysphagia, unspecified(787.20) 02/04/2013  . History of laparoscopic adjustable gastric banding, APL, 12/20/2008 12/05/2012  . Burning chest pain 08/17/2012  . Anxiety 11/24/2010  . Headache(784.0) 10/20/2010  . NEOPLASM, MALIGNANT, OVARY, HX OF 12/05/2009  . CONSTIPATION, CHRONIC 09/06/2009  . POSTMENOPAUSAL ATROPHIC VAGINITIS 05/24/2009  . EXTERNAL HEMORRHOIDS 10/08/2007  . DIVERTICULOSIS, COLON 10/08/2007  . Obesity 05/02/2006  . HYPERTENSION, BENIGN SYSTEMIC 05/02/2006    Past Surgical History:  Procedure Laterality Date  . ABDOMINAL HYSTERECTOMY    . CHOLECYSTECTOMY    . ESOPHAGEAL MANOMETRY N/A 07/06/2013   Procedure: ESOPHAGEAL MANOMETRY (EM);  Surgeon: Arta Silence, MD;  Location: [facility];  Service: Endoscopy;  Laterality: N/A;  . GASTRIC RESTRICTION SURGERY     lap band  . HEMORROIDECTOMY      OB History    Gravida Para Term Preterm AB Living   2 2 2      2    SAB TAB Ectopic Multiple Live Births                   Home Medications    Prior to Admission medications   Medication Sig Start Date End Date Taking? Authorizing Provider  hydrochlorothiazide (HYDRODIURIL) 25 MG tablet take 1 tablet by mouth once daily 04/17/16  Yes McKeag, Marylynn Pearson, MD  furosemide (LASIX) 40 MG tablet Take 1 tablet (40 mg total) by mouth every other day. 08/05/16 08/10/16  Sherlene Shams, MD  naproxen (NAPROSYN) 375 MG tablet Take 1 tablet (375 mg total) by mouth 2 (two) times daily. 08/05/16   Sherlene Shams, MD  potassium chloride (K-DUR) 10 MEQ tablet Take 1 tablet (10 mEq total) by mouth every other day. With furosemide doses 08/05/16 08/10/16  Sherlene Shams, MD    Family History Family History  Problem Relation Age of Onset  . Thyroid cancer Mother   . Hypertension Mother   . Hyperlipidemia Mother   . Breast cancer Unknown        aunts    Social History Social History  Substance Use Topics  . Smoking status: Never Smoker  . Smokeless tobacco: Never Used  . Alcohol use No     Allergies   Penicillins   Review of Systems Review of Systems  All other systems reviewed and are negative.    Physical Exam Triage Vital  Signs ED Triage Vitals  Enc Vitals Group     BP 08/05/16 1707 (!) 157/72     Pulse Rate 08/05/16 1707 65     Resp 08/05/16 1707 18     Temp 08/05/16 1707 98.4 F (36.9 C)     Temp Source 08/05/16 1707 Oral     SpO2 08/05/16 1707 99 %     Weight --      Height --      Pain Score 08/05/16 1706 8     Pain Loc --    Updated Vital Signs BP (!) 157/72 (BP Location: Right Arm)   Pulse 65   Temp 98.4 F (36.9 C) (Oral)   Resp 18   SpO2 99%   Physical Exam  Constitutional: She is oriented to person, place, and time. No distress.  HENT:  Head: Atraumatic.  Eyes:  Conjugate gaze observed, no eye redness/discharge  Neck: Neck supple.  Cardiovascular: Normal rate.   Pulmonary/Chest: No respiratory distress.  Abdominal: She  exhibits no distension.  Musculoskeletal: Normal range of motion.  Mild swelling of both feet and ankles, more notable on the left, 1-2+ pitting,  on the right, 1+. Tenderness over the entire dorso lateral aspect of the left foot and ankle. No fissuring between the toes, no rash. May be slightly warmer to palpation with the left ankle than the right. Patient was able to walk into the urgent care independently, just hurts to walk.  Neurological: She is alert and oriented to person, place, and time.  Skin: Skin is warm and dry.  Nursing note and vitals reviewed.    UC Treatments / Results   Procedures Procedures (including critical care time) Boot orthosis applied by clinical staff  Final Clinical Impressions(s) / UC Diagnoses   Final diagnoses:  Acute left ankle pain  Left ankle swelling   Xray was not done today because no fall and so unlikely to be a bony injury/fracture. However, if not improving with the medicines/boot, would recheck and consider imaging.  Could be a little bit of gout or something like gout, or just fluid retention causing irritation/pain in the left ankle.  Does not look infected today. Prescriptions for furosemide (fluid pill) and potassium pill (to take with furosemide) were sent to the pharmacy, to help with swelling.  Wear boot while up and around for the next several days, to improve comfort.  An elastic stocking may also help with swelling/discomfort.   Prescription for naproxen (anti inflammatory/pain reliever) was also sent to the pharmacy, to help reduce inflammation/pain.    New Prescriptions New Prescriptions   FUROSEMIDE (LASIX) 40 MG TABLET    Take 1 tablet (40 mg total) by mouth every other day.   NAPROXEN (NAPROSYN) 375 MG TABLET    Take 1 tablet (375 mg total) by mouth 2 (two) times daily.   POTASSIUM CHLORIDE (K-DUR) 10 MEQ TABLET    Take 1 tablet (10 mEq total) by mouth every other day. With furosemide doses     Sherlene Shams, MD 08/06/16  1014

## 2016-08-05 NOTE — Discharge Instructions (Addendum)
Xray was not done today because no fall and so unlikely to be a bony injury/fracture. However, if not improving with the medicines/boot, would recheck and consider imaging.  Could be a little bit of gout or something like gout, or just fluid retention causing irritation/pain in the left ankle.  Does not look infected today. Prescriptions for furosemide (fluid pill) and potassium pill (to take with furosemide) were sent to the pharmacy, to help with swelling.  Wear boot while up and around for the next several days, to improve comfort.  An elastic stocking may also help with swelling/discomfort.   Prescription for naproxen (anti inflammatory/pain reliever) was also sent to the pharmacy, to help reduce inflammation/pain.

## 2016-08-13 ENCOUNTER — Other Ambulatory Visit: Payer: Self-pay | Admitting: Gastroenterology

## 2016-08-21 ENCOUNTER — Encounter (HOSPITAL_COMMUNITY): Payer: Self-pay | Admitting: *Deleted

## 2016-08-21 ENCOUNTER — Other Ambulatory Visit: Payer: Self-pay | Admitting: Gastroenterology

## 2016-08-22 ENCOUNTER — Ambulatory Visit (HOSPITAL_COMMUNITY)
Admission: RE | Admit: 2016-08-22 | Discharge: 2016-08-22 | Disposition: A | Discharge status: Home / Self Care | Payer: BC Managed Care – PPO | Source: Ambulatory Visit | Attending: Gastroenterology | Admitting: Gastroenterology

## 2016-08-22 ENCOUNTER — Encounter (HOSPITAL_COMMUNITY): Payer: Self-pay | Admitting: Anesthesiology

## 2016-08-22 ENCOUNTER — Encounter (HOSPITAL_COMMUNITY)
Admission: RE | Disposition: A | Discharge status: Home / Self Care | Payer: Self-pay | Source: Ambulatory Visit | Attending: Gastroenterology

## 2016-08-22 ENCOUNTER — Ambulatory Visit (HOSPITAL_COMMUNITY): Payer: BC Managed Care – PPO | Admitting: Anesthesiology

## 2016-08-22 DIAGNOSIS — Z79899 Other long term (current) drug therapy: Secondary | ICD-10-CM | POA: Diagnosis not present

## 2016-08-22 DIAGNOSIS — I10 Essential (primary) hypertension: Secondary | ICD-10-CM | POA: Diagnosis not present

## 2016-08-22 DIAGNOSIS — Z1211 Encounter for screening for malignant neoplasm of colon: Secondary | ICD-10-CM | POA: Diagnosis not present

## 2016-08-22 DIAGNOSIS — Z6841 Body Mass Index (BMI) 40.0 and over, adult: Secondary | ICD-10-CM | POA: Diagnosis not present

## 2016-08-22 HISTORY — DX: Other specified postprocedural states: R11.2

## 2016-08-22 HISTORY — DX: Malignant (primary) neoplasm, unspecified: C80.1

## 2016-08-22 HISTORY — DX: Adverse effect of unspecified anesthetic, initial encounter: T41.45XA

## 2016-08-22 HISTORY — DX: Other complications of anesthesia, initial encounter: T88.59XA

## 2016-08-22 HISTORY — DX: Other specified postprocedural states: Z98.890

## 2016-08-22 HISTORY — PX: COLONOSCOPY WITH PROPOFOL: SHX5780

## 2016-08-22 SURGERY — COLONOSCOPY WITH PROPOFOL
Anesthesia: Monitor Anesthesia Care

## 2016-08-22 MED ORDER — PROPOFOL 500 MG/50ML IV EMUL
INTRAVENOUS | Status: DC | PRN
  Administered 2016-08-22: 50 mg via INTRAVENOUS

## 2016-08-22 MED ORDER — PROPOFOL 500 MG/50ML IV EMUL
INTRAVENOUS | Status: DC | PRN
  Administered 2016-08-22: 75 ug/kg/min via INTRAVENOUS

## 2016-08-22 MED ORDER — MIDAZOLAM HCL 2 MG/2ML IJ SOLN
INTRAMUSCULAR | Status: AC
  Filled 2016-08-22: qty 2

## 2016-08-22 MED ORDER — SODIUM CHLORIDE 0.9 % IV SOLN: INTRAVENOUS | Status: DC

## 2016-08-22 MED ORDER — ONDANSETRON HCL 4 MG/2ML IJ SOLN
INTRAMUSCULAR | Status: AC
  Filled 2016-08-22: qty 2

## 2016-08-22 MED ORDER — PROPOFOL 10 MG/ML IV BOLUS
INTRAVENOUS | Status: AC
  Filled 2016-08-22: qty 40

## 2016-08-22 MED ORDER — LACTATED RINGERS IV SOLN
INTRAVENOUS | Status: DC | PRN
  Administered 2016-08-22: 09:00:00 via INTRAVENOUS

## 2016-08-22 MED ORDER — MIDAZOLAM HCL 5 MG/5ML IJ SOLN
INTRAMUSCULAR | Status: DC | PRN
  Administered 2016-08-22: 2 mg via INTRAVENOUS

## 2016-08-22 SURGICAL SUPPLY — 21 items

## 2016-08-22 NOTE — Op Note (Signed)
[facility] Patient Name: [name] Procedure Date: 08/22/2016 MRN: [id number] Attending MD: Arta Silence , MD Date of Birth: [birth year] CSN: [id number] Age: 51 Admit Type: Outpatient Procedure:                Colonoscopy Indications:              Screening for colorectal malignant neoplasm, Last                            colonoscopy: date unknown Providers:                Arta Silence, MD, Laverta Baltimore RN, RN, Cherylynn Ridges, Technician, Arnoldo Hooker, CRNA Referring MD:              Medicines:                Monitored Anesthesia Care Complications:            No immediate complications. Estimated Blood Loss:     Estimated blood loss: none. Procedure:                Pre-Anesthesia Assessment:                           - Prior to the procedure, a History and Physical                            was performed, and patient medications and                            allergies were reviewed. The patient's tolerance of                            previous anesthesia was also reviewed. The risks                            and benefits of the procedure and the sedation                            options and risks were discussed with the patient.                            All questions were answered, and informed consent                            was obtained. Prior Anticoagulants: The patient has                            taken no previous anticoagulant or antiplatelet                            agents. ASA Grade Assessment: II - A patient with  mild systemic disease. After reviewing the risks                            and benefits, the patient was deemed in                            satisfactory condition to undergo the procedure.                           After obtaining informed consent, the colonoscope                            was passed under direct vision. Throughout the   procedure, the patient's blood pressure, pulse, and                            oxygen saturations were monitored continuously. The                            EC-[id number] ([id number]) scope was introduced through                            the anus and advanced to the the cecum, identified                            by appendiceal orifice and ileocecal valve. The                            colonoscopy was performed without difficulty. The                            patient tolerated the procedure well. The quality                            of the bowel preparation was good. The ileocecal                            valve, appendiceal orifice, and rectum were                            photographed. Scope In: 8:42:16 AM Scope Out: 9:02:14 AM Scope Withdrawal Time: 0 hours 10 minutes 30 seconds  Total Procedure Duration: 0 hours 19 minutes 58 seconds  Findings:      The perianal and digital rectal examinations were normal.      The entire examined colon appeared normal on direct and retroflexion       views. No polyps, masses, vascular ectasias, or inflammatory changes       were seen.      The retroflexed view of the distal rectum and anal verge was normal and       showed no anal or rectal abnormalities. Impression:               - The entire examined colon is normal on direct and  retroflexion views.                           - The distal rectum and anal verge are normal on                            retroflexion view. Moderate Sedation:      None Recommendation:           - Patient has a contact number available for                            emergencies. The signs and symptoms of potential                            delayed complications were discussed with the                            patient. Return to normal activities tomorrow.                            Written discharge instructions were provided to the                            patient.                            - Discharge patient to home (via wheelchair).                           - Resume previous diet today.                           - Continue present medications.                           - Repeat colonoscopy in 10 years for screening                            purposes.                           - Return to GI clinic PRN.                           - Return to referring physician as previously                            scheduled. Procedure Code(s):        --- Professional ---                           [phone], Colonoscopy, flexible; diagnostic, including                            collection of specimen(s) by brushing or washing,  when performed (separate procedure) Diagnosis Code(s):        --- Professional ---                           Z12.11, Encounter for screening for malignant                            neoplasm of colon CPT copyright 2016 [employer]. All rights reserved. The codes documented in this report are preliminary and upon coder review may  be revised to meet current compliance requirements. Arta Silence, MD 08/22/2016 9:08:32 AM This report has been signed electronically. Number of Addenda: 0

## 2016-08-22 NOTE — Discharge Instructions (Signed)

## 2016-08-22 NOTE — H&P (Signed)
Patient interval history reviewed.  Patient examined again.  There has been no change from documented H/P dated 08/21/16 (scanned into chart from our office) except as documented above.  Assessment:  1.  Colon cancer screening. 2.  Obesity (BMI over 50).  Plan:  1.  Colonoscopy. 2.  Risks (bleeding, infection, bowel perforation that could require surgery, sedation-related changes in cardiopulmonary systems), benefits (identification and possible treatment of source of symptoms, exclusion of certain causes of symptoms), and alternatives (watchful waiting, radiographic imaging studies, empiric medical treatment) of colonoscopy were explained to patient/family in detail and patient wishes to proceed.

## 2016-08-22 NOTE — Transfer of Care (Signed)
Immediate Anesthesia Transfer of Care Note  Patient: [name]  Procedure(s) Performed: Procedure(s): COLONOSCOPY WITH PROPOFOL (N/A)  Patient Location: PACU  Anesthesia Type:MAC  Level of Consciousness:  sedated, patient cooperative and responds to stimulation  Airway & Oxygen Therapy:Patient Spontanous Breathing and Patient connected to face mask oxgen  Post-op Assessment:  Report given to PACU RN and Post -op Vital signs reviewed and stable  Post vital signs:  Reviewed and stable  Last Vitals:  Vitals:   08/22/16 0810  BP: 137/87  Pulse: 87  Resp: 19  Temp: 72.6 C    Complications: No apparent anesthesia complications

## 2016-08-22 NOTE — Anesthesia Preprocedure Evaluation (Signed)
Anesthesia Evaluation  Patient identified by MRN, date of birth, ID band  Reviewed: Allergy & Precautions, NPO status , Patient's Chart, lab work & pertinent test results  History of Anesthesia Complications (+) PONV and history of anesthetic complications  Airway Mallampati: I  TM Distance: >3 FB Neck ROM: Full    Dental  (+) Teeth Intact   Pulmonary neg pulmonary ROS,    breath sounds clear to auscultation       Cardiovascular hypertension, Pt. on medications (-) angina(-) Past MI and (-) CHF  Rhythm:Regular     Neuro/Psych  Headaches, neg Seizures PSYCHIATRIC DISORDERS Anxiety    GI/Hepatic negative GI ROS, Neg liver ROS,   Endo/Other  Morbid obesity  Renal/GU negative Renal ROS     Musculoskeletal   Abdominal   Peds  Hematology negative hematology ROS (+)   Anesthesia Other Findings   Reproductive/Obstetrics                             Anesthesia Physical Anesthesia Plan  ASA: II  Anesthesia Plan: MAC   Post-op Pain Management:    Induction:   PONV Risk Score and Plan: 3 and Treatment may vary due to age or medical condition  Airway Management Planned: Nasal Cannula and Simple Face Mask  Additional Equipment:   Intra-op Plan:   Post-operative Plan:   Informed Consent: I have reviewed the patients History and Physical, chart, labs and discussed the procedure including the risks, benefits and alternatives for the proposed anesthesia with the patient or authorized representative who has indicated his/her understanding and acceptance.   Dental advisory given  Plan Discussed with: CRNA and Surgeon  Anesthesia Plan Comments:         Anesthesia Quick Evaluation

## 2016-08-27 NOTE — Anesthesia Postprocedure Evaluation (Signed)
Anesthesia Post Note  Patient: [name]  Procedure(s) Performed: Procedure(s) (LRB): COLONOSCOPY WITH PROPOFOL (N/A)     Patient location during evaluation: Endoscopy Anesthesia Type: MAC Level of consciousness: awake and alert Pain management: pain level controlled Vital Signs Assessment: post-procedure vital signs reviewed and stable Respiratory status: spontaneous breathing, nonlabored ventilation, respiratory function stable and patient connected to nasal cannula oxygen Cardiovascular status: stable Anesthetic complications: no    Last Vitals:  Vitals:   08/22/16 0910 08/22/16 0925  BP: (!) 142/89 (!) 135/91  Pulse: 80 76  Resp: 17 (!) 23  Temp:      Last Pain:  Vitals:   08/23/16 1607  TempSrc:   PainSc: 0-No pain                 [name]

## 2016-10-25 ENCOUNTER — Encounter (HOSPITAL_COMMUNITY): Payer: Self-pay | Admitting: Emergency Medicine

## 2016-10-25 ENCOUNTER — Ambulatory Visit (HOSPITAL_COMMUNITY)
Admission: EM | Admit: 2016-10-25 | Discharge: 2016-10-25 | Disposition: A | Discharge status: Home / Self Care | Payer: BC Managed Care – PPO | Attending: Emergency Medicine | Admitting: Emergency Medicine

## 2016-10-25 DIAGNOSIS — S93402A Sprain of unspecified ligament of left ankle, initial encounter: Secondary | ICD-10-CM | POA: Diagnosis not present

## 2016-10-25 LAB — POCT URINALYSIS DIP (DEVICE)
Bilirubin Urine: NEGATIVE
Glucose, UA: NEGATIVE mg/dL
HGB URINE DIPSTICK: NEGATIVE
Ketones, ur: NEGATIVE mg/dL
Leukocytes, UA: NEGATIVE
Nitrite: NEGATIVE
PH: 5.5 (ref 5.0–8.0)
PROTEIN: 30 mg/dL — AB
UROBILINOGEN UA: 1 mg/dL (ref 0.0–1.0)

## 2016-10-25 MED ORDER — NAPROXEN 500 MG PO TABS: 500.0000 mg | ORAL_TABLET | Freq: Two times a day (BID) | ORAL | 0 refills | Status: DC

## 2016-10-25 NOTE — ED Provider Notes (Signed)
[place]   [id number] 10/25/16 Arrival Time: [place]:  1. Sprain of left ankle, unspecified ligament, initial encounter     Meds ordered this encounter  Medications  . naproxen (NAPROSYN) 500 MG tablet    Sig: Take 1 tablet (500 mg total) by mouth 2 (two) times daily with a meal.    Dispense:  20 tablet    Refill:  0    Order Specific Question:   Supervising Provider    Answer:   Melynda Ripple [[id number]]    Reviewed expectations re: course of current medical issues. Questions answered. Outlined signs and symptoms indicating need for more acute intervention. Patient verbalized understanding. After Visit Summary given.   SUBJECTIVE:  [name] is a 51 y.o. female who presents with complaint of follow up on left ankle discomfort which is improving.  She had some blood on her tissue earlier today.  ROS: As per HPI.   OBJECTIVE:  Vitals:   10/25/16 2047  BP: 126/86  Pulse: 73  Resp: 18  Temp: 98.1 F (36.7 C)  TempSrc: Oral  SpO2: 100%     General appearance: alert; no distress Eyes: PERRLA; EOMI; conjunctiva normal HENT: normocephalic; atraumatic; TMs normal; nasal mucosa normal; oral mucosa normal Neck: supple Lungs: clear to auscultation bilaterally Heart: regular rate and rhythm Abdomen: soft, non-tender; bowel sounds normal; no masses or organomegaly; no guarding or rebound tenderness Back: no CVA tenderness Extremities: no cyanosis or edema; symmetrical with no gross deformities Skin: warm and dry Neurologic: normal gait; normal symmetric reflexes Psychological: alert and cooperative; normal mood and affect  Past Medical History:  Diagnosis Date  . 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 Complication of anesthesia    UPSETS STOMACH  . Hyperlipidemia   . Hypertension   . IBS (irritable bowel syndrome)   . Morbid obesity ([place])   . PONV (postoperative nausea and vomiting)   . Sinus complaint   . Wears  glasses      has a past medical history of Cancer ([place]) (2007); Complication of anesthesia; Hyperlipidemia; Hypertension; IBS (irritable bowel syndrome); Morbid obesity ([place]); PONV (postoperative nausea and vomiting); Sinus complaint; and Wears glasses.  Results for orders placed or performed during the hospital encounter of 10/25/16  POCT urinalysis dip (device)  Result Value Ref Range   Glucose, UA NEGATIVE NEGATIVE mg/dL   Bilirubin Urine NEGATIVE NEGATIVE   Ketones, ur NEGATIVE NEGATIVE mg/dL   Specific Gravity, Urine >=1.030 1.005 - 1.030   Hgb urine dipstick NEGATIVE NEGATIVE   pH 5.5 5.0 - 8.0   Protein, ur 30 (A) NEGATIVE mg/dL   Urobilinogen, UA 1.0 0.0 - 1.0 mg/dL   Nitrite NEGATIVE NEGATIVE   Leukocytes, UA NEGATIVE NEGATIVE    Labs Reviewed  POCT URINALYSIS DIP (DEVICE) - Abnormal; Notable for the following:       Result Value   Protein, ur 30 (*)    All other components within normal limits    Imaging: No results found.  Allergies  Allergen Reactions  . Other     general anesthesia - upsets stomach   . Penicillins Hives and Itching    Has patient had a PCN reaction causing immediate rash, facial/tongue/throat swelling, SOB or lightheadedness with hypotension: Yes Has patient had a PCN reaction causing severe rash involving mucus membranes or skin necrosis: Yes Has patient had a PCN reaction that required hospitalization: No Has patient had a PCN reaction occurring within the last  10 years: No If all of the above answers are "NO", then may proceed with Cephalosporin use.     Family History  Problem Relation Age of Onset  . Thyroid cancer Mother   . Hypertension Mother   . Hyperlipidemia Mother   . Breast cancer Unknown        aunts   Past Surgical History:  Procedure Laterality Date  . ABDOMINAL HYSTERECTOMY     COMPLETE  . CHOLECYSTECTOMY  1990  . COLONOSCOPY WITH PROPOFOL N/A 08/22/2016   Procedure: COLONOSCOPY WITH PROPOFOL;  Surgeon: Arta Silence, MD;  Location: [facility];  Service: Endoscopy;  Laterality: N/A;  . ESOPHAGEAL MANOMETRY N/A 07/06/2013   Procedure: ESOPHAGEAL MANOMETRY (EM);  Surgeon: Arta Silence, MD;  Location: [facility];  Service: Endoscopy;  Laterality: N/A;  . GASTRIC RESTRICTION SURGERY  2010   lap band  . Ethelene Hal, FNP 10/25/16 2114

## 2016-10-25 NOTE — ED Triage Notes (Signed)
Noticed blood in urine today.  Patient has low abdominal pain. Denies burning with urination.   Patient is also concerned for tingling in left foot.

## 2016-10-25 NOTE — Discharge Instructions (Signed)
UA was normal so monitor for any further bleeding and follow up if continues

## 2016-10-31 ENCOUNTER — Other Ambulatory Visit: Payer: Self-pay | Admitting: Family Medicine

## 2016-10-31 DIAGNOSIS — Z1231 Encounter for screening mammogram for malignant neoplasm of breast: Secondary | ICD-10-CM

## 2016-11-01 ENCOUNTER — Ambulatory Visit (INDEPENDENT_AMBULATORY_CARE_PROVIDER_SITE_OTHER): Payer: BC Managed Care – PPO | Admitting: Family Medicine

## 2016-11-01 ENCOUNTER — Encounter: Payer: Self-pay | Admitting: Family Medicine

## 2016-11-01 VITALS — BP 132/78 | HR 95 | Temp 98.1°F | Ht 64.0 in | Wt 330.0 lb

## 2016-11-01 DIAGNOSIS — R42 Dizziness and giddiness: Secondary | ICD-10-CM

## 2016-11-01 DIAGNOSIS — R1032 Left lower quadrant pain: Secondary | ICD-10-CM | POA: Diagnosis not present

## 2016-11-01 DIAGNOSIS — H6123 Impacted cerumen, bilateral: Secondary | ICD-10-CM

## 2016-11-01 MED ORDER — MECLIZINE HCL 32 MG PO TABS: 32.0000 mg | ORAL_TABLET | Freq: Three times a day (TID) | ORAL | 0 refills | Status: DC | PRN

## 2016-11-01 NOTE — Patient Instructions (Addendum)

## 2016-11-01 NOTE — Progress Notes (Signed)
   Subjective:    Patient ID: [name] is a 51 y.o. female presenting with No chief complaint on file.  on 11/01/2016  HPI: Notes some lower abdominal pain on LLQ. Had some blood in her urine.  Went to urgent care for this but it resolved. S/p hysterectomy. 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 Last night notes some head pressure and feels light headed. Overly yawning and sense of fatigue. Awoke at 2 am last pm. Noted some room spinning. Left ear feels like something is in it.  Review of Systems  Constitutional: Negative for chills and fever.  Respiratory: Negative for shortness of breath.   Cardiovascular: Negative for chest pain.  Gastrointestinal: Negative for abdominal pain, nausea and vomiting.  Genitourinary: Negative for dysuria.  Skin: Negative for rash.      Objective:    BP 132/78   Pulse 95   Temp 98.1 F (36.7 C) (Oral)   Ht 5\' 4"  (1.626 m)   Wt (!) 330 lb (149.7 kg)   SpO2 98%   BMI 56.64 kg/m  Physical Exam  Constitutional: She is oriented to person, place, and time. She appears well-developed and well-nourished. No distress.  HENT:  Head: Normocephalic and atraumatic.  Right Ear: Tympanic membrane normal.  Left Ear: Tympanic membrane normal.  Bilateral cerumen noted  Eyes: No scleral icterus.  Neck: Neck supple.  Cardiovascular: Normal rate.   Pulmonary/Chest: Effort normal.  Abdominal: Soft.  Neurological: She is alert and oriented to person, place, and time.  Skin: Skin is warm and dry.  Psychiatric: She has a normal mood and affect.        Assessment & Plan:  Vertigo - trial of antivert--may be related to panic and stress--find stress relievers - Plan: meclizine (ANTIVERT) 32 MG tablet  Cerumen debris on tympanic membrane of both ears - s/p irrigation and removal  Left lower quadrant pain - may be related to some mild diverticular disease--resolved now   Total face-to-face  time with patient: 15 minutes. Over 50% of encounter was spent on counseling and coordination of care. Return in about 4 weeks (around 11/29/2016).  Donnamae Jude 11/01/2016 3:56 PM

## 2016-11-08 ENCOUNTER — Ambulatory Visit
Admission: RE | Admit: 2016-11-08 | Discharge: 2016-11-08 | Disposition: A | Discharge status: Home / Self Care | Payer: BC Managed Care – PPO | Source: Ambulatory Visit | Attending: Family Medicine | Admitting: Family Medicine

## 2016-11-08 DIAGNOSIS — Z1231 Encounter for screening mammogram for malignant neoplasm of breast: Secondary | ICD-10-CM

## 2016-11-19 ENCOUNTER — Emergency Department (HOSPITAL_COMMUNITY)
Admission: EM | Admit: 2016-11-19 | Discharge: 2016-11-19 | Disposition: A | Discharge status: Home / Self Care | Payer: BC Managed Care – PPO | Attending: Emergency Medicine | Admitting: Emergency Medicine

## 2016-11-19 ENCOUNTER — Ambulatory Visit (INDEPENDENT_AMBULATORY_CARE_PROVIDER_SITE_OTHER): Payer: BC Managed Care – PPO | Admitting: Family Medicine

## 2016-11-19 ENCOUNTER — Encounter (HOSPITAL_COMMUNITY): Payer: Self-pay | Admitting: *Deleted

## 2016-11-19 ENCOUNTER — Ambulatory Visit (HOSPITAL_COMMUNITY)
Admission: EM | Admit: 2016-11-19 | Discharge: 2016-11-19 | Discharge status: Against Medical Advice | Payer: BC Managed Care – PPO | Source: Home / Self Care

## 2016-11-19 VITALS — BP 170/108 | HR 84 | Temp 98.0°F

## 2016-11-19 DIAGNOSIS — Z8543 Personal history of malignant neoplasm of ovary: Secondary | ICD-10-CM | POA: Diagnosis not present

## 2016-11-19 DIAGNOSIS — I10 Essential (primary) hypertension: Secondary | ICD-10-CM | POA: Diagnosis not present

## 2016-11-19 DIAGNOSIS — R220 Localized swelling, mass and lump, head: Secondary | ICD-10-CM | POA: Diagnosis not present

## 2016-11-19 DIAGNOSIS — Z79899 Other long term (current) drug therapy: Secondary | ICD-10-CM | POA: Diagnosis not present

## 2016-11-19 DIAGNOSIS — R21 Rash and other nonspecific skin eruption: Secondary | ICD-10-CM | POA: Insufficient documentation

## 2016-11-19 DIAGNOSIS — Z880 Allergy status to penicillin: Secondary | ICD-10-CM | POA: Insufficient documentation

## 2016-11-19 DIAGNOSIS — T7840XA Allergy, unspecified, initial encounter: Secondary | ICD-10-CM | POA: Diagnosis present

## 2016-11-19 DIAGNOSIS — T783XXA Angioneurotic edema, initial encounter: Secondary | ICD-10-CM

## 2016-11-19 DIAGNOSIS — L509 Urticaria, unspecified: Secondary | ICD-10-CM | POA: Insufficient documentation

## 2016-11-19 MED ORDER — METHYLPREDNISOLONE SODIUM SUCC 125 MG IJ SOLR
125.0000 mg | Freq: Once | INTRAMUSCULAR | Status: AC
  Administered 2016-11-19: 125 mg via INTRAMUSCULAR

## 2016-11-19 MED ORDER — DIPHENHYDRAMINE HCL 50 MG/ML IJ SOLN
25.0000 mg | Freq: Once | INTRAMUSCULAR | Status: AC
  Administered 2016-11-19: 25 mg via INTRAVENOUS
  Filled 2016-11-19: qty 1

## 2016-11-19 MED ORDER — SODIUM CHLORIDE 0.9 % IV BOLUS (SEPSIS)
1000.0000 mL | Freq: Once | INTRAVENOUS | Status: AC
  Administered 2016-11-19: 1000 mL via INTRAVENOUS

## 2016-11-19 MED ORDER — PREDNISONE 10 MG PO TABS: 40.0000 mg | ORAL_TABLET | Freq: Every day | ORAL | 0 refills | Status: DC

## 2016-11-19 MED ORDER — FAMOTIDINE 20 MG PO TABS: 20.0000 mg | ORAL_TABLET | Freq: Two times a day (BID) | ORAL | 0 refills | Status: DC

## 2016-11-19 MED ORDER — FAMOTIDINE IN NACL 20-0.9 MG/50ML-% IV SOLN
20.0000 mg | Freq: Once | INTRAVENOUS | Status: AC
  Administered 2016-11-19: 20 mg via INTRAVENOUS
  Filled 2016-11-19: qty 50

## 2016-11-19 MED ORDER — DIPHENHYDRAMINE HCL 25 MG PO CAPS: 25.0000 mg | ORAL_CAPSULE | Freq: Four times a day (QID) | ORAL | 0 refills | Status: DC | PRN

## 2016-11-19 NOTE — ED Notes (Signed)
Patient states she is feeling a little better, upper lip still swollen states he tongue isn't swollen. No resp distress at present.

## 2016-11-19 NOTE — ED Notes (Signed)
Patient has a rash on her face swelling to upper lip c/o scratchy to throat. No resp problems. Able to swallow without difficulty. States she was bitten by something on her ankles took benadryl fri. Went to [facility] and was sent to the ed. Didn't want to wait went back to [facility] and was given Depo Medrol injection and sent back to ED.

## 2016-11-19 NOTE — ED Triage Notes (Signed)
Pt states she was bite on left leg on Saturday and now with rash to left face face and left upper eyelid and then had sob.  Last took benadryl 0630 this am and states it got worse.  Pt states rash itches and her throat feels itchy.  Talking in complete sentences. Rash has spread to chest

## 2016-11-19 NOTE — ED Provider Notes (Signed)
[facility] DEPT Provider Note   CSN: [id number] Arrival date & time: 11/19/16  1402     History   Chief Complaint Chief Complaint  Patient presents with  . Allergic Reaction    HPI [name] is a 51 y.o. female with history of HLD, HTN, morbid obesity, and IBS who presents today with chief complaint acute onset, progressively worsening urticarial rash and facial swelling. She states that on Thursday 5 days ago, she was bitten by multiple infections which she thinks were mosquitoes which resulted in multiple raised lesions to her bilateral ankles. She has been applying hydrocortisone cream with mild relief. Yesterday, she states she developed a small rash to her left cheek but did not do anything for this. She awoke today and took a tablet of Benadryl for the itching from the insect bites, and states that subsequently she developed worsening urticarial rash and swelling of her lips and left eyelid. She denies any pain. She states that she went to her primary care office for evaluation, and they sent her to the ED for further evaluation. She states that she was tired of waiting while in the waiting room and return to the family medicine practice, who gave her IM Depo-Medrol and sent her back to the ED. At that time she was experiencing throat tightness but no drooling. Throat tightness improved after the administration of the Depo-Medrol. She denies chest pain, SOB, abdominal pain, nausea, or vomiting. She denies any new lotions, detergents, shampoos, soaps, or new foods. She has had Benadryl in the past without difficulty.  The history is provided by the patient.    Past Medical History:  Diagnosis Date  . Cancer (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 Complication of anesthesia    UPSETS STOMACH  . Hyperlipidemia   . Hypertension   . IBS (irritable bowel syndrome)   . Morbid obesity ([place])   . PONV (postoperative nausea and vomiting)   . Sinus complaint   . Wears glasses       Patient Active Problem List   Diagnosis Date Noted  . Abdominal pain, epigastric 02/09/2016  . Healthcare maintenance 03/18/2015  . 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 Feeling tired 11/26/2014  . Dysphagia, unspecified(787.20) 02/04/2013  . History of laparoscopic adjustable gastric banding, APL, 12/20/2008 12/05/2012  . Burning chest pain 08/17/2012  . Anxiety 11/24/2010  . Headache(784.0) 10/20/2010  . NEOPLASM, MALIGNANT, OVARY, HX OF 12/05/2009  . CONSTIPATION, CHRONIC 09/06/2009  . POSTMENOPAUSAL ATROPHIC VAGINITIS 05/24/2009  . EXTERNAL HEMORRHOIDS 10/08/2007  . DIVERTICULOSIS, COLON 10/08/2007  . Obesity 05/02/2006  . HYPERTENSION, BENIGN SYSTEMIC 05/02/2006    Past Surgical History:  Procedure Laterality Date  . ABDOMINAL HYSTERECTOMY     COMPLETE  . CHOLECYSTECTOMY  1990  . COLONOSCOPY WITH PROPOFOL N/A 08/22/2016   Procedure: COLONOSCOPY WITH PROPOFOL;  Surgeon: Arta Silence, MD;  [facility];  Service: Endoscopy;  Laterality: N/A;  . ESOPHAGEAL MANOMETRY N/A 07/06/2013   Procedure: ESOPHAGEAL MANOMETRY (EM);  Surgeon: Arta Silence, MD;  [facility];  Service: Endoscopy;  Laterality: N/A;  . GASTRIC RESTRICTION SURGERY  2010   lap band  . HEMORROIDECTOMY      OB History    Gravida Para Term Preterm AB Living   2 2 2     2    SAB TAB Ectopic Multiple Live Births  Home Medications    Prior to Admission medications   Medication Sig Start Date End Date Taking? Authorizing Provider  hydrochlorothiazide (HYDRODIURIL) 25 MG tablet take 1 tablet by mouth once daily Patient taking differently: take 25mg  by mouth once daily 04/17/16  Yes McKeag, Marylynn Pearson, MD  loratadine (CLARITIN) 10 MG tablet Take 10 mg by mouth daily as needed for allergies.   Yes [provider]  meclizine (ANTIVERT) 32 MG tablet Take 1 tablet (32 mg total) by mouth 3 (three) times daily as  needed. Patient taking differently: Take 32 mg by mouth 3 (three) times daily as needed for dizziness.  11/01/16  Yes Donnamae Jude, MD  diphenhydrAMINE (BENADRYL) 25 mg capsule Take 1 capsule (25 mg total) by mouth every 6 (six) hours as needed for itching or allergies. 11/19/16   Shell Blanchette A, PA-C  famotidine (PEPCID) 20 MG tablet Take 1 tablet (20 mg total) by mouth 2 (two) times daily. 11/19/16   Nils Flack, Daulton Harbaugh A, PA-C  naproxen (NAPROSYN) 500 MG tablet Take 1 tablet (500 mg total) by mouth 2 (two) times daily with a meal. Patient not taking: Reported on 11/19/2016 10/25/16   Lysbeth Penner, FNP  predniSONE (DELTASONE) 10 MG tablet Take 4 tablets (40 mg total) by mouth daily. 11/19/16 11/24/16  Renita Papa, PA-C    Family History Family History  Problem Relation Age of Onset  . Thyroid cancer Mother   . Hypertension Mother   . Hyperlipidemia Mother   . Breast cancer Unknown        aunts  . Breast cancer Maternal Aunt   . Breast cancer Maternal Aunt     Social History Social History  Substance Use Topics  . Smoking status: Never Smoker  . Smokeless tobacco: Never Used  . Alcohol use No     Allergies   Other and Penicillins   Review of Systems Review of Systems  Constitutional: Negative for chills and fever.  HENT: Positive for trouble swallowing. Negative for congestion.   Eyes: Negative for pain, discharge, redness, itching and visual disturbance.  Respiratory: Negative for shortness of breath.   Cardiovascular: Negative for chest pain.  Gastrointestinal: Negative for abdominal pain, nausea and vomiting.  Skin: Positive for rash.  Neurological: Negative for facial asymmetry.  All other systems reviewed and are negative.    Physical Exam Updated Vital Signs BP (!) 150/79 (BP Location: Right Arm)   Pulse 66   Temp 97.6 F (36.4 C) (Oral)   Resp 18   Ht 5' 4.5" (1.638 m)   Wt (!) 149.7 kg (330 lb)   SpO2 100%   BMI 55.77 kg/m   Physical Exam   Constitutional: She appears well-developed and well-nourished. No distress.  HENT:  Head: Normocephalic and atraumatic.  Mouth/Throat: Oropharynx is clear and moist.  Swelling of the upper lip, R>L. No swelling of the tongue. Urticarial rash noted. No trismus, uvular deviation, tonsillar hypertrophy, suddenly or abnormalities, or drooling noted  Eyes: Pupils are equal, round, and reactive to light. Conjunctivae and EOM are normal. Right eye exhibits no discharge. Left eye exhibits no discharge.  Left upper and lower eyelid with swelling and erythema as well as urticarial rash Nontender to palpation. No pain with EOMs. No consensual photophobia, no chemosis, proptosis, or injected conjunctiva.  Neck: Normal range of motion. Neck supple. No JVD present. No tracheal deviation present. No thyromegaly present.  Cardiovascular: Normal rate, regular rhythm, normal heart sounds and intact distal pulses.   Pulmonary/Chest:  Effort normal and breath sounds normal. No stridor. No respiratory distress. She has no wheezes. She has no rales. She exhibits no tenderness.  Abdominal: Soft. She exhibits no distension. There is no tenderness.  Musculoskeletal: She exhibits no edema.  Lymphadenopathy:    She has no cervical adenopathy.  Neurological: She is alert.  Skin: Skin is warm and dry. Rash noted. She is not diaphoretic. No erythema.  Generalized urticarial rash primarily localized to the face but also to the breast, neck, BUE and upper back. Multiple raised <53mm lesions to the bilateral ankles and  Distal shin, excoriations noted. No vesicles, pustules, surrounding erythema, or tenderness.rash spares the palms and soles.  Psychiatric: She has a normal mood and affect. Her behavior is normal.  Nursing note and vitals reviewed.    ED Treatments / Results  Labs (all labs ordered are listed, but only abnormal results are displayed) Labs Reviewed - No data to display  EKG  EKG Interpretation None        Radiology No results found.  Procedures Procedures (including critical care time)  Medications Ordered in ED Medications  diphenhydrAMINE (BENADRYL) injection 25 mg (25 mg Intravenous Given 11/19/16 1744)  famotidine (PEPCID) IVPB 20 mg premix (0 mg Intravenous Stopped 11/19/16 1828)  sodium chloride 0.9 % bolus 1,000 mL (0 mLs Intravenous Stopped 11/19/16 1907)     Initial Impression / Assessment and Plan / ED Course  I have reviewed the triage vital signs and the nursing notes.  Pertinent labs & imaging results that were available during my care of the patient were reviewed by me and considered in my medical decision making (see chart for details).     Patient with allergic reaction to an unknown allergen.urticarial rash noted with swelling of the lips and left upper and lower eyelids. Afebrile, became hypertensive at one point well in the ED with resolution back to baseline. Vital signs otherwise stable, SPO2 saturations under percent on room air.she was given Depo-Medrol by primary care physician prior to evaluation by EDP. Patient given Benadryl, Pepcid, fluids while in the ED and observed. On reevaluation, she is resting comfortable, tolerating PO food and fluids without difficulty, and ambulatory. No throat tightness, drooling, shortness of breath. No evidence of anaphylactic shock. She stable for discharge home with follow-up with primary care physician or allergist for reevaluation. Will discharge with Benadryl, Pepcid, and steroid pulse.discussed indications for return to the ED. Pt verbalized understanding of and agreement with plan and is safe for discharge home at this time. Patient seen and evaluated by Dr. Ellender Hose, who agrees with assessment and plan at this time.   Final Clinical Impressions(s) / ED Diagnoses   Final diagnoses:  Allergic reaction, initial encounter  Urticaria  Facial swelling    New Prescriptions Discharge Medication List as of 11/19/2016  7:10 PM     START taking these medications   Details  diphenhydrAMINE (BENADRYL) 25 mg capsule Take 1 capsule (25 mg total) by mouth every 6 (six) hours as needed for itching or allergies., Starting Mon 11/19/2016, Print    famotidine (PEPCID) 20 MG tablet Take 1 tablet (20 mg total) by mouth 2 (two) times daily., Starting Mon 11/19/2016, Print    predniSONE (DELTASONE) 10 MG tablet Take 4 tablets (40 mg total) by mouth daily., Starting Mon 11/19/2016, Until Sat 11/24/2016, Print         Nils Flack, [place] A, PA-C 11/19/16 Durward Fortes, MD 11/21/16 [phone]

## 2016-11-19 NOTE — Discharge Instructions (Signed)
Start taking prednisone as prescribed beginning tomorrow. You may take Benadryl every 6 hours as needed for itching and hives, Pepcid twice daily for the same. Follow-up with your primary care physician or an allergist for reevaluation. Return to the ED immediately if any concerning signs or symptoms develop such as worsening swelling, throat tightness, drooling, or difficulty breathing.

## 2016-11-19 NOTE — ED Notes (Signed)
She went to MD office and they gave her depomedrol and sent her back her.  R/O anaphylaxis.

## 2016-11-19 NOTE — ED Notes (Signed)
Pt upset about wait time. Pt stated she would rather go back to her PCP office or Urgent Care. Made sure the Pt knew the importance of getting seen but that we did still have a wait. Pt stated again she did not want to wait and was leaving.

## 2016-11-19 NOTE — Progress Notes (Signed)
Date of Visit: 11/19/2016   HPI:  Patient presents for a same day walk-in appointment to discuss facial swelling and shortness of breath.   Patient walked into our clinic shortly before 1:30pm. Upon arrival she was moved to the RN triage room and I was contacted to see her emergently. She had obvious swelling of her upper lip and left eyelid and was complaining of throat itching/closing. We immediately called 911. In the interim while waiting for EMS, patient had difficulty speaking in full sentences and seemed in distress. No wheezing on exam. We had epinephrine drawn up and prepared to administer it, but EMS arrived prior to that happening, by which time the patient was able to speak in full sentences and seemed in much less distress. She did not actually receive epinephrine here. EMS promptly arrived and transported her to the ED for management of anaphylaxis.  Subsequently, approximately 2 hours later, we received a phone call from the patient that she was returning to our clinic as she had been sitting in the ED waiting room for hours without seeing a physician. I called and spoke with the ED charge nurse and advised her of patient's condition, however patient had already reported to the ED staff that she was leaving and walking to our clinic. She shortly thereafter arrived and was seen again in the clinic nurse triage office of the [place].   She still had an itchy rash with swelling of her lips, ears, and left eyelid, but overall clinically appeared better. Reported being bitten by mosquitos a few days ago, with rash appearing yesterday and swelling of face starting today. Denies any recent medication changes or food changes. Has history of hives/itching with PCN but did not take any penicillin lately. At time of repeat eval around 3:30pm patient still without wheezing on exam. Patient reluctant to return to emergency room due to wait times. Does still have itching of her throat.  Patient complains of being hungry and thirsty.  ROS: See HPI  [place]: history of chronic constipation, obesity, prior ovarian cancer, diverticulosis, anxity  PHYSICAL EXAM: BP (!) 170/108 (BP Location: Left Arm, Patient Position: Sitting, Cuff Size: Large)   Pulse 84   Temp 98 F (36.7 C) (Oral)   SpO2 97%  Initial exam @ 1:30pm: Gen: moderate acute distress, eyes closed, sitting upright, complaining of shortness of breath  HEENT: swelling noted to left upper lip, left upper eyelid, left ear, also chin Lungs: clear to auscultation bilaterally, no wheezes noted, mild resp distress, no crackles Neuro: alert, oriented Extremities: mosquito bites/insect bites noted to legs  Subsequent exam at 3:30pm: Gen: NAD, pleasant, cooperative HEENT: continued swelling noted to upper lip, left upper eyelid, left ear, also chin, not significantly worse than earlier at 1:30p Lungs: CTAB, NWOB Neuro: follows commands, alert and oriented  Extremities: mosquito bites/insect bites noted to legs  ASSESSMENT/PLAN:  51 yo F presenting with concern for anaphylaxis. After first presentation at 1:30p patient transported emergently to the ER via EMS.  Patient then re-presented at 3:30p after being unsatisfied with wait times in the ED, not in acute distress but still with swelling of face/lips. Discussed with patient that as a primary care clinic, we are not well equipped to manage acute anaphylaxis and provide the kind of observation that is necessary should she rapidly deteriorate. Encouraged her to return to the ED, which she was reluctant to do, but after further discussion she agreed. We did give her a dose of 125mg  solumedrol IM  prior to her leaving. She declined transport back to the ED, insisted on driving herself there and accepted the risks of leaving unaccompanied by medical personnel. She did have decision making capacity. The ED staff called [place] shortly afterward and let us know she arrived quickly and  safely back to their care.   [place]. Ardelia Mems, [place]

## 2016-11-22 ENCOUNTER — Encounter (HOSPITAL_COMMUNITY): Payer: Self-pay | Admitting: Emergency Medicine

## 2016-11-22 ENCOUNTER — Ambulatory Visit (HOSPITAL_COMMUNITY)
Admission: EM | Admit: 2016-11-22 | Discharge: 2016-11-22 | Disposition: A | Discharge status: Home / Self Care | Payer: BC Managed Care – PPO | Attending: Physician Assistant | Admitting: Physician Assistant

## 2016-11-22 DIAGNOSIS — L237 Allergic contact dermatitis due to plants, except food: Secondary | ICD-10-CM | POA: Diagnosis not present

## 2016-11-22 DIAGNOSIS — L255 Unspecified contact dermatitis due to plants, except food: Secondary | ICD-10-CM

## 2016-11-22 MED ORDER — PREDNISONE 20 MG PO TABS: ORAL_TABLET | ORAL | 0 refills | Status: AC

## 2016-11-22 MED ORDER — METHYLPREDNISOLONE SODIUM SUCC 125 MG IJ SOLR
125.0000 mg | Freq: Once | INTRAMUSCULAR | Status: AC
  Administered 2016-11-22: 125 mg via INTRAMUSCULAR

## 2016-11-22 MED ORDER — CETIRIZINE HCL 10 MG PO CAPS: ORAL_CAPSULE | ORAL | 0 refills | Status: DC

## 2016-11-22 MED ORDER — METHYLPREDNISOLONE SODIUM SUCC 125 MG IJ SOLR
INTRAMUSCULAR | Status: AC
  Filled 2016-11-22: qty 2

## 2016-11-22 NOTE — ED Triage Notes (Signed)
PT had an allergic reaction earlier this week. PT reports her throat was closing and she was sent to the ER.   PT then had depo-medrol. PT was taken back to the ER.   This was all same day. She was sent home with prednisone and benadryl.  PT has new rash on neck, face, and between breasts. These areas are itchy. PT did not take benadryl today. No airway involvement. NAD.

## 2016-11-22 NOTE — Discharge Instructions (Signed)
It can take up to 21 days for this rash to resolve.  Come back if the rash returns once you have stopped taking my steroid dose pack. Avoid warm showers and lying under heavy covers.

## 2016-11-22 NOTE — ED Provider Notes (Addendum)
11/22/2016 9:35 PM   DOB: [birth year] / MRN: [id number]  SUBJECTIVE:  [name] is a 51 y.o. female presenting for rash about her arms and face that started on 4 days ago. She had some lips swelling and she was sent to [facility] for further evaluation. She received IV benadryl there and tells me she improved and she was later started on 40 mg of prednisone, which she took today.   Did do some work in the yard about 1 week ago.  No fever, chills, nausea.  She feels that she is getting worse.   She is allergic to other and penicillins.   She  has a past medical history of Cancer ([place]) (2007); Complication of anesthesia; Hyperlipidemia; Hypertension; IBS (irritable bowel syndrome); Morbid obesity ([place]); PONV (postoperative nausea and vomiting); Sinus complaint; and Wears glasses.    She  reports that she has never smoked. She has never used smokeless tobacco. She reports that she does not drink alcohol or use drugs. She  reports that she currently engages in sexual activity. She reports using the following method of birth control/protection: Surgical. The patient  has a past surgical history that includes Hemorroidectomy; Gastric restriction surgery (2010); Esophageal manometry (N/A, 07/06/2013); Cholecystectomy (1990); Abdominal hysterectomy; and Colonoscopy with propofol (N/A, 08/22/2016).  Her family history includes Breast cancer in her maternal aunt, maternal aunt, and unknown relative; Hyperlipidemia in her mother; Hypertension in her mother; Thyroid cancer in her mother.  Review of Systems  Constitutional: Negative for chills and fever.  HENT: Negative for sore throat.   Respiratory: Negative for shortness of breath.   Gastrointestinal: Negative for abdominal pain, nausea and vomiting.  Skin: Positive for itching and rash.  Neurological: Negative for dizziness.    OBJECTIVE:  BP 140/82   Pulse 64   Temp 98.2 F (36.8 C) (Oral)   Resp 16   Wt (!) 330 lb (149.7 kg)   SpO2 99%   BMI  55.77 kg/m   Lab Results  Component Value Date   HGBA1C 5.5 12/14/2014     Physical Exam  Constitutional: She is active.  Non-toxic appearance.  Cardiovascular: Normal rate, regular rhythm, S1 normal, S2 normal, normal heart sounds and intact distal pulses.  Exam reveals no gallop, no friction rub and no decreased pulses.   No murmur heard. Pulmonary/Chest: Effort normal. No stridor. No tachypnea. No respiratory distress. She has no wheezes. She has no rales.  Abdominal: She exhibits no distension.  Musculoskeletal: She exhibits no edema, tenderness or deformity.  Neurological: She is alert.  Skin: Skin is warm and dry. Rash (blaching erythematous papular rash, most consistent with a rhus reaction) noted. She is not diaphoretic. No pallor.    No results found for this or any previous visit (from the past 72 hour(s)).  No results found.  ASSESSMENT AND PLAN:  The encounter diagnosis was Rhus dermatitis. Exposed most likely 7 days ago while in the yard and now symptoms pushing through 40 of predinsone.  Will boost that dose today with solumedrol as she did have some lip swelling at her PCP office.  She will start 60 mg taper tomorrow. Noraml A1c in late 2016.     The patient is advised to call or return to clinic if she does not see an improvement in symptoms, or to seek the care of the closest emergency department if she worsens with the above plan.   Philis Fendt, MHS, PA-C 11/22/2016 9:35 PM    Tereasa Coop, PA-C  11/22/16 2137    Tereasa Coop, PA-C 11/22/16 2140

## 2016-11-22 NOTE — ED Triage Notes (Signed)
PT has appt with allergist 10/9

## 2016-11-27 ENCOUNTER — Encounter: Payer: Self-pay | Admitting: Family Medicine

## 2016-11-27 ENCOUNTER — Ambulatory Visit (INDEPENDENT_AMBULATORY_CARE_PROVIDER_SITE_OTHER): Payer: BC Managed Care – PPO | Admitting: Family Medicine

## 2016-11-27 VITALS — BP 118/74 | HR 77 | Temp 97.7°F | Ht 65.0 in | Wt 337.8 lb

## 2016-11-27 DIAGNOSIS — L508 Other urticaria: Secondary | ICD-10-CM | POA: Diagnosis not present

## 2016-11-27 MED ORDER — HYDROXYZINE HCL 25 MG PO TABS: 25.0000 mg | ORAL_TABLET | Freq: Three times a day (TID) | ORAL | 0 refills | Status: DC | PRN

## 2016-11-27 MED ORDER — BETAMETHASONE DIPROPIONATE 0.05 % EX OINT: TOPICAL_OINTMENT | Freq: Two times a day (BID) | CUTANEOUS | 0 refills | Status: DC

## 2016-11-27 NOTE — Progress Notes (Signed)
   CC: follow up rash  HPI Rash and ?allergic reaction - seen in [facility] on 9/17, EMS called 2/2 c/w anaphylaxis. Patient left ED and returned to [facility] due to long ED wait times. Patient given depo medrol here and sent back to ED. Seen in ED with thought to be acute urticaria. Given 4 day prednisone burst, pepcid, benadryl. Seen in UC on 9/20, thought to be 2/2 contact dermatitis 2/2 poison ivy due to vesicular appearance over L forearm. Given IM solumedrol at [facility] and d/c with 60mg  prednisone taper (60x3 days, 40x3 days, then 20x3 days). Taking 20mg  dose today. Patient reports some mild improvement with current medication regimen. She is taking pepcid, benadryl, zyrtec, and prednisone. She feels that she may have gotten an insect bite that started all of this. Denies changes in soaps, foods, exposures, detergents, medicines. Never had anything like this before. No hx of asthma or allergies.   ROS: denies CP, SOB, throat swelling, dysphagia, facial swelling, weakness.   CC, SH/smoking status, and VS noted  Objective: BP 118/74   Pulse 77   Temp 97.7 F (36.5 C) (Oral)   Ht 5\' 5"  (1.651 m)   Wt (!) 337 lb 12.8 oz (153.2 kg)   SpO2 98%   BMI 56.21 kg/m  Gen: NAD, alert, cooperative, and pleasant obese female.  Skin: papular rash over L forearm with erythema, scaly papular rash over L ear, L facial cheek. 6cm x 6cm erythematous macule with central hyperpigmentation over bilateral central breasts (appears in area with friction).  HEENT: NCAT, EOMI, PERRL CV: RRR, no murmur Resp: CTAB, no wheezes, non-labored Abd: SNTND, BS present, no guarding or organomegaly Ext: No edema, warm Neuro: Alert and oriented, Speech clear, No gross deficits  Assessment and plan:  Acute urticaria Patient with several visits over the last 8 days for same. Minimal improvement with benadryl, pepcid, zyrtec, prednisone. Today without airway compromise. No identifiable cause. Placed urgent derm referral for possible  allergy testing, may need immunomodulators. Added atarax and topical steroid in the interim. Could consider changing to another antihistamine if she were to return prior to derm eval.    Orders Placed This Encounter  Procedures  . Ambulatory referral to Dermatology    Referral Priority:   Urgent    Referral Type:   Consultation    Referral Reason:   Specialty Services Required    Requested Specialty:   Dermatology    Number of Visits Requested:   1    Meds ordered this encounter  Medications  . hydrOXYzine (ATARAX/VISTARIL) 25 MG tablet    Sig: Take 1 tablet (25 mg total) by mouth 3 (three) times daily as needed.    Dispense:  30 tablet    Refill:  0  . betamethasone dipropionate (DIPROLENE) 0.05 % ointment    Sig: Apply topically 2 (two) times daily.    Dispense:  30 g    Refill:  0     Ralene Ok, MD, PGY2 11/28/2016 11:10 AM

## 2016-11-27 NOTE — Patient Instructions (Signed)
It was a pleasure to see you today! Thank you for choosing [facility] for your primary care. [name] was seen for acute hives and rash.   Our plans for today were:  Use the cream for the rash between your breasts. Otherwise, try to keep the area dry.   I have placed an urgent dermatology referral. They should call you within the week.   Continue the medicines you are currently taking.   I have also sent another class of anti-itch medicine that you can try. It may make you sleepy, start by taking it at night.   Best,  Dr. Lindell Noe

## 2016-11-28 DIAGNOSIS — L508 Other urticaria: Secondary | ICD-10-CM | POA: Insufficient documentation

## 2016-11-28 NOTE — Assessment & Plan Note (Signed)
Patient with several visits over the last 8 days for same. Minimal improvement with benadryl, pepcid, zyrtec, prednisone. Today without airway compromise. No identifiable cause. Placed urgent derm referral for possible allergy testing, may need immunomodulators. Added atarax and topical steroid in the interim. Could consider changing to another antihistamine if she were to return prior to derm eval.

## 2016-12-03 ENCOUNTER — Telehealth: Payer: Self-pay | Admitting: *Deleted

## 2016-12-03 NOTE — Telephone Encounter (Signed)
Patient left message on nurse line stating that she could not get into see Dermatologist until November, would like a referral to someplace else sooner.

## 2016-12-03 NOTE — Telephone Encounter (Signed)
I have gotten patient scheduled to see Dr. Rinaldo Ratel on Wed. 10/3 at 9:10 AM. I attempted to contact patient but LMOVM to have patient return call.   Patient also has an appt here at [facility] tomorrow. Will make sure she has this appt info.   Dr. Allyn Kenner and Dr. Rinaldo Ratel Address: [address], [place], [place] [address] Phone: [phone]

## 2016-12-04 ENCOUNTER — Encounter (HOSPITAL_COMMUNITY): Payer: Self-pay | Admitting: Emergency Medicine

## 2016-12-04 ENCOUNTER — Ambulatory Visit: Payer: BC Managed Care – PPO | Admitting: Family Medicine

## 2016-12-04 ENCOUNTER — Ambulatory Visit (HOSPITAL_COMMUNITY)
Admission: EM | Admit: 2016-12-04 | Discharge: 2016-12-04 | Disposition: A | Discharge status: Home / Self Care | Payer: BC Managed Care – PPO | Attending: Family Medicine | Admitting: Family Medicine

## 2016-12-04 DIAGNOSIS — J029 Acute pharyngitis, unspecified: Secondary | ICD-10-CM | POA: Diagnosis present

## 2016-12-04 LAB — POCT RAPID STREP A: Streptococcus, Group A Screen (Direct): NEGATIVE

## 2016-12-04 MED ORDER — CETIRIZINE-PSEUDOEPHEDRINE ER 5-120 MG PO TB12: 1.0000 | ORAL_TABLET | Freq: Every day | ORAL | 0 refills | Status: DC

## 2016-12-04 MED ORDER — FLUTICASONE PROPIONATE 50 MCG/ACT NA SUSP: 2.0000 | Freq: Every day | NASAL | 0 refills | Status: DC

## 2016-12-04 MED ORDER — PHENOL 1.4 % MT LIQD: 1.0000 | OROMUCOSAL | 0 refills | Status: DC | PRN

## 2016-12-04 NOTE — ED Triage Notes (Signed)
Pt here for sore throat with redness; pt has recently been on pred for hives x 2 weeks

## 2016-12-04 NOTE — ED Provider Notes (Signed)
[name]    CSN: [id number] Arrival date & time: 12/04/16  1658     History   Chief Complaint Chief Complaint  Patient presents with  . Sore Throat    HPI [name] is a 51 y.o. female.   Comes in for 3 day history of sore throat. Denies cough, congestion, rhinorrhea, ear pain, eye pain. Has had some shortness of breath with exertion due to sore throat. Denies fever, chills, night sweats. Has not tried anything for the sore throat. Painful with swallowing, but denies trouble breathing, swelling of the throat.  Patient has been treated for hives for the past few weeks, just finished prednisone treatment. Will be seeing dermatologist tomorrow for further evaluation.       Past Medical History:  Diagnosis Date  . 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 Complication of anesthesia    UPSETS STOMACH  . Hyperlipidemia   . Hypertension   . IBS (irritable bowel syndrome)   . Morbid obesity ([place])   . PONV (postoperative nausea and vomiting)   . Sinus complaint   . Wears glasses     Patient Active Problem List   Diagnosis Date Noted  . Acute urticaria 11/28/2016  . Abdominal pain, epigastric 02/09/2016  . Healthcare maintenance 03/18/2015  . 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 Feeling tired 11/26/2014  . Dysphagia, unspecified(787.20) 02/04/2013  . History of laparoscopic adjustable gastric banding, APL, 12/20/2008 12/05/2012  . Burning chest pain 08/17/2012  . Anxiety 11/24/2010  . Headache(784.0) 10/20/2010  . NEOPLASM, MALIGNANT, OVARY, HX OF 12/05/2009  . CONSTIPATION, CHRONIC 09/06/2009  . POSTMENOPAUSAL ATROPHIC VAGINITIS 05/24/2009  . EXTERNAL HEMORRHOIDS 10/08/2007  . DIVERTICULOSIS, COLON 10/08/2007  . Obesity 05/02/2006  . HYPERTENSION, BENIGN SYSTEMIC 05/02/2006    Past Surgical History:  Procedure Laterality Date  . ABDOMINAL HYSTERECTOMY     COMPLETE  . CHOLECYSTECTOMY   1990  . COLONOSCOPY WITH PROPOFOL N/A 08/22/2016   Procedure: COLONOSCOPY WITH PROPOFOL;  Surgeon: Arta Silence, MD;  Location: [facility] ENDOSCOPY;  Service: Endoscopy;  Laterality: N/A;  . ESOPHAGEAL MANOMETRY N/A 07/06/2013   Procedure: ESOPHAGEAL MANOMETRY (EM);  Surgeon: Arta Silence, MD;  Location: [facility] ENDOSCOPY;  Service: Endoscopy;  Laterality: N/A;  . GASTRIC RESTRICTION SURGERY  2010   lap band  . HEMORROIDECTOMY      OB History    Gravida Para Term Preterm AB Living   2 2 2     2    SAB TAB Ectopic Multiple Live Births                   Home Medications    Prior to Admission medications   Medication Sig Start Date End Date Taking? Authorizing Provider  betamethasone dipropionate (DIPROLENE) 0.05 % ointment Apply topically 2 (two) times daily. 11/27/16   Sela Hilding, MD  Cetirizine HCl (ZYRTEC ALLERGY) 10 MG CAPS Take 10 mg by mouth daily. 11/22/16   Tereasa Coop, PA-C  cetirizine-pseudoephedrine (ZYRTEC-D) 5-120 MG tablet Take 1 tablet by mouth daily. 12/04/16   Tasia Catchings, Lonell Stamos V, PA-C  diphenhydrAMINE (BENADRYL) 25 mg capsule Take 1 capsule (25 mg total) by mouth every 6 (six) hours as needed for itching or allergies. 11/19/16   Fawze, Mina A, PA-C  famotidine (PEPCID) 20 MG tablet Take 1 tablet (20 mg total) by mouth 2 (two) times daily. 11/19/16   Fawze, Mina A, PA-C  fluticasone (FLONASE) 50 MCG/ACT nasal spray Place  2 sprays into both nostrils daily. 12/04/16   Tasia Catchings, Avyay Coger V, PA-C  hydrochlorothiazide (HYDRODIURIL) 25 MG tablet take 1 tablet by mouth once daily Patient taking differently: take 25mg  by mouth once daily 04/17/16   McKeag, Marylynn Pearson, MD  hydrOXYzine (ATARAX/VISTARIL) 25 MG tablet Take 1 tablet (25 mg total) by mouth 3 (three) times daily as needed. 11/27/16   Sela Hilding, MD  phenol (CHLORASEPTIC) 1.4 % LIQD Use as directed 1 spray in the mouth or throat as needed for throat irritation / pain. 12/04/16   Ok Edwards, PA-C    Family History Family History  Problem  Relation Age of Onset  . Thyroid cancer Mother   . Hypertension Mother   . Hyperlipidemia Mother   . Breast cancer Unknown        aunts  . Breast cancer Maternal Aunt   . Breast cancer Maternal Aunt     Social History Social History  Substance Use Topics  . Smoking status: Never Smoker  . Smokeless tobacco: Never Used  . Alcohol use No     Allergies   Other and Penicillins   Review of Systems Review of Systems   Physical Exam Triage Vital Signs ED Triage Vitals  Enc Vitals Group     BP 12/04/16 1705 (!) 144/98     Pulse Rate 12/04/16 1705 90     Resp 12/04/16 1705 18     Temp 12/04/16 1705 98.2 F (36.8 C)     Temp Source 12/04/16 1705 Oral     SpO2 12/04/16 1705 99 %     Weight 12/04/16 1706 (!) 337 lb (152.9 kg)     Height 12/04/16 1706 5\' 4"  (1.626 m)     Head Circumference --      Peak Flow --      Pain Score 12/04/16 1706 4     Pain Loc --      Pain Edu? --      Excl. in [place]? --    No data found.   Updated Vital Signs BP (!) 144/98 (BP Location: Right Arm)   Pulse 90   Temp 98.2 F (36.8 C) (Oral)   Resp 18   Ht 5\' 4"  (1.626 m)   Wt (!) 337 lb (152.9 kg)   SpO2 99%   BMI 57.85 kg/m    Physical Exam  Constitutional: She is oriented to person, place, and time. She appears well-developed and well-nourished. No distress.  HENT:  Head: Normocephalic and atraumatic.  Right Ear: Tympanic membrane, external ear and ear canal normal. Tympanic membrane is not erythematous and not bulging.  Left Ear: Tympanic membrane, external ear and ear canal normal. Tympanic membrane is not erythematous and not bulging.  Nose: Mucosal edema and rhinorrhea present. Right sinus exhibits no maxillary sinus tenderness and no frontal sinus tenderness. Left sinus exhibits no maxillary sinus tenderness and no frontal sinus tenderness.  Mouth/Throat: Uvula is midline and mucous membranes are normal. Posterior oropharyngeal erythema present. Tonsils are 1+ on the right.  Tonsils are 1+ on the left. No tonsillar exudate.  Eyes: Pupils are equal, round, and reactive to light. Conjunctivae are normal.  Neck: Normal range of motion. Neck supple.  Cardiovascular: Normal rate, regular rhythm and normal heart sounds.  Exam reveals no gallop and no friction rub.   No murmur heard. Pulmonary/Chest: Effort normal and breath sounds normal. She has no decreased breath sounds. She has no wheezes. She has no rhonchi. She has no rales.  Lymphadenopathy:  She has no cervical adenopathy.  Neurological: She is alert and oriented to person, place, and time.  Skin: Skin is warm and dry.  Psychiatric: She has a normal mood and affect. Her behavior is normal. Judgment normal.     UC Treatments / Results  Labs (all labs ordered are listed, but only abnormal results are displayed) Labs Reviewed  POCT RAPID STREP A    EKG  EKG Interpretation None       Radiology No results found.  Procedures Procedures (including critical care time)  Medications Ordered in UC Medications - No data to display   Initial Impression / Assessment and Plan / UC Course  I have reviewed the triage vital signs and the nursing notes.  Pertinent labs & imaging results that were available during my care of the patient were reviewed by me and considered in my medical decision making (see chart for details).    Rapid strep negative. Symptomatic treatment as needed. Return precautions given.    Final Clinical Impressions(s) / UC Diagnoses   Final diagnoses:  Pharyngitis, unspecified etiology    New Prescriptions New Prescriptions   CETIRIZINE-PSEUDOEPHEDRINE (ZYRTEC-D) 5-120 MG TABLET    Take 1 tablet by mouth daily.   FLUTICASONE (FLONASE) 50 MCG/ACT NASAL SPRAY    Place 2 sprays into both nostrils daily.   PHENOL (CHLORASEPTIC) 1.4 % LIQD    Use as directed 1 spray in the mouth or throat as needed for throat irritation / pain.       Ok Edwards, PA-C 12/04/16 1747

## 2016-12-04 NOTE — Discharge Instructions (Signed)
Rapid strep negative. Symptoms are most likely due to viral illness. Start phenol for sore throat. Flonase and/or Zyrtec-D for nasal congestion. You can use over the counter nasal saline rinse such as neti pot for nasal congestion. Monitor for any worsening of symptoms, swelling of the throat, trouble breathing, trouble swallowing, follow up for reevaluation.

## 2016-12-07 LAB — CULTURE, GROUP A STREP (THRC)

## 2016-12-11 ENCOUNTER — Ambulatory Visit (INDEPENDENT_AMBULATORY_CARE_PROVIDER_SITE_OTHER): Payer: BC Managed Care – PPO | Admitting: Pediatrics

## 2016-12-11 ENCOUNTER — Encounter: Payer: Self-pay | Admitting: Pediatrics

## 2016-12-11 VITALS — BP 126/88 | HR 84 | Temp 97.8°F | Resp 24 | Ht 65.0 in | Wt 334.8 lb

## 2016-12-11 DIAGNOSIS — J3089 Other allergic rhinitis: Secondary | ICD-10-CM

## 2016-12-11 DIAGNOSIS — Z91038 Other insect allergy status: Secondary | ICD-10-CM | POA: Diagnosis not present

## 2016-12-11 DIAGNOSIS — R0602 Shortness of breath: Secondary | ICD-10-CM

## 2016-12-11 DIAGNOSIS — Z6841 Body Mass Index (BMI) 40.0 and over, adult: Secondary | ICD-10-CM | POA: Diagnosis not present

## 2016-12-11 DIAGNOSIS — T63421D Toxic effect of venom of ants, accidental (unintentional), subsequent encounter: Secondary | ICD-10-CM

## 2016-12-11 DIAGNOSIS — L500 Allergic urticaria: Secondary | ICD-10-CM

## 2016-12-11 DIAGNOSIS — J45990 Exercise induced bronchospasm: Secondary | ICD-10-CM | POA: Diagnosis not present

## 2016-12-11 MED ORDER — EPINEPHRINE 0.3 MG/0.3ML IJ SOAJ: INTRAMUSCULAR | 2 refills | Status: DC

## 2016-12-11 MED ORDER — TRIAMCINOLONE ACETONIDE 0.1 % EX OINT: TOPICAL_OINTMENT | CUTANEOUS | 3 refills | Status: DC

## 2016-12-11 NOTE — Patient Instructions (Addendum)
Environmental control of dust and mold Zyrtec 10 mg once a day if needed for runny nose or itching Fluticasone 2 sprays per nostril once a day if needed for stuffy nose Triamcinolone 0.1% ointment twice a day if needed to red itchy areas below the face Pro-air-2 puffs every 4 hours if needed for wheezing or coughing spells. She may use Pro-air 2 puffs 5-15 minutes before exercise Call me if you're not doing better on this treatment plan Let me know if you have another allergic reaction I will call you with the results of the blood test for allergy to mosquitoes and fire ants   If you have an allergic reaction take Benadryl 50 mg every 4 hours and if you have life-threatening symptoms inject  with EpiPen 0.3 mg. Then write what you  had to eat or drink in the previous 4 hours

## 2016-12-11 NOTE — Progress Notes (Signed)
[place] [id number] Dept: [phone]  New Patient Note  Patient ID: [name], female    DOB: October 07, 1965  Age: 51 y.o. MRN: [id number] Date of Office Visit: 12/11/2016 Referring provider: Sela Hilding, MD [address] [place], [place] [address]    Chief Complaint: Allergic Reaction (11/19/16.  patient bitten by mosquito.  (allergic to mosquito bites).  Facial swelling and trouble breathing.); Urticaria (patient thinks Benadryl made sx worse. Facial, lips and eyes severe swelling.); and Medication Reaction (Beandryl or all other medications patient has been given since reaction.)  HPI ESTRELLA ALCARAZ presents for evaluation of an allergic reaction.. She had 4 mosquito bites in the left ankle on 11/15/2016. There were some localized itching. By 11/18/2016 she developed hives, and shortness of breath. On 11/19/2016 she had some difficulty swallowing and shortness of breath with hives and was taken to the emergency room She was treated with Depo-Medrol and prednisone Benadryl and Pepcid. She continued to have progression of the rash and has been on 2 courses of prednisone. Hydroxyzine made her very sleepy. The rash has left her with hyperpigmented areas  where she had previous itching. The original site were the mosquitoes  bit her had some papulation and a suggestion of even pustulation, which lasted for about a week.. She needed another course of prednisone which was finished on 12/03/2016. She saw a  dermatologist who diagnosed her with urticaria and placed  her on Diprolene ointment. She does not feel that the Diprolene ointment has helped her very much :however she is not itching today.. She had hives this for about 2 days 20 years ago but no subsequent hives. She has a history of sinus problems for several years aggravated by exposure to dust and cigarette smoke. She has never had  food allergies . She has not had a history of asthma in the past, but did have some  coughing and shortness of breath with this reaction. She has recently noticed some shortness of breath with exertion Her daughter has asthma. 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  Review of Systems  Constitutional:       Obese  HENT:       Sinus problems for several years aggravated by exposure to dust and cigarette smoke  Eyes: Negative.   Respiratory:       No history of asthma. She had some shortness of breath with  this recent reaction and  used an albuterol inhaler  Cardiovascular:       Hypertension  Gastrointestinal:       Cholecystectomy. 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 Hysterectomy. She gets yearly CTs of her abdomen  Musculoskeletal: Negative.   Skin:       Hives, lip swelling eye , I swelling and shortness of breath. A few days after 4 mosquito bites  Neurological:       History of migraine headaches  Endo/Heme/Allergies:       No diabetes or thyroid disease  Psychiatric/Behavioral: Negative.     Outpatient Encounter Prescriptions as of 12/11/2016  Medication Sig  . betamethasone dipropionate (DIPROLENE) 0.05 % ointment Apply topically 2 (two) times daily.  . hydrochlorothiazide (HYDRODIURIL) 25 MG tablet take 1 tablet by mouth once daily (Patient taking differently: take 25mg  by mouth once daily)  . cetirizine (ZYRTEC)  10 MG tablet TK 1 T PO D  . cetirizine-pseudoephedrine (ZYRTEC-D) 5-120 MG tablet Take 1 tablet by mouth daily. (Patient not taking: Reported on 12/11/2016)  . diphenhydrAMINE (BENADRYL) 25 mg capsule Take 1 capsule (25 mg total) by mouth every 6 (six) hours as needed for itching or allergies. (Patient not taking: Reported on 12/11/2016)  . EPINEPHrine (EPIPEN 2-PAK) 0.3 mg/0.3 mL IJ SOAJ injection Use as directed for severe allergic reaction.  . famotidine (PEPCID) 20 MG tablet Take 1 tablet (20 mg  total) by mouth 2 (two) times daily. (Patient not taking: Reported on 12/11/2016)  . fluticasone (FLONASE) 50 MCG/ACT nasal spray Place 2 sprays into both nostrils daily. (Patient not taking: Reported on 12/11/2016)  . hydrOXYzine (VISTARIL) 25 MG capsule take 1-2 capsules by mouth at bedtime (MAY BECOME DROWSY)  . phenol (CHLORASEPTIC) 1.4 % LIQD Use as directed 1 spray in the mouth or throat as needed for throat irritation / pain. (Patient not taking: Reported on 12/11/2016)  . triamcinolone ointment (KENALOG) 0.1 % Apply twice a day to red itchy areas below the face.  . [DISCONTINUED] Cetirizine HCl (ZYRTEC ALLERGY) 10 MG CAPS Take 10 mg by mouth daily.  . [DISCONTINUED] hydrOXYzine (ATARAX/VISTARIL) 25 MG tablet Take 1 tablet (25 mg total) by mouth 3 (three) times daily as needed.  . [DISCONTINUED] hydrOXYzine (ATARAX/VISTARIL) 25 MG tablet take 1 tablet by mouth three times a day if needed  . [DISCONTINUED] naproxen (NAPROSYN) 500 MG tablet Take 500 mg by mouth 2 (two) times daily with a meal.  . [DISCONTINUED] predniSONE (DELTASONE) 10 MG tablet take 4 tablets by mouth once daily   No facility-administered encounter medications on file as of 12/11/2016.      Drug Allergies:  Allergies  Allergen Reactions  . Other     general anesthesia - upsets stomach   . Penicillins Hives and Itching    Has patient had a PCN reaction causing immediate rash, facial/tongue/throat swelling, SOB or lightheadedness with hypotension: Yes Has patient had a PCN reaction causing severe rash involving mucus membranes or skin necrosis: Yes Has patient had a PCN reaction that required hospitalization: No Has patient had a PCN reaction occurring within the last 10 years: No If all of the above answers are "NO", then may proceed with Cephalosporin use.     Family History: Ashleymarie's family history includes Asthma in her sister; Breast cancer in her maternal aunt, maternal aunt, and unknown relative; Hyperlipidemia in  her mother; Hypertension in her mother; Thyroid cancer in her mother.. There is no family history of hayfever, sinus problems, angioedema, eczema, hives, food allergies, lupus.  Social and environmental. There are no pets in the home. She is not exposed to cigarette smoking. She has not smoked cigarettes in the past. She is a Animal nutritionist in high school  Physical Exam: BP 126/88 (BP Location: Left Arm, Patient Position: Sitting, Cuff Size: Large)   Pulse 84   Temp 97.8 F (36.6 C) (Oral)   Resp (!) 24   Ht 5\' 5"  (1.651 m)   Wt (!) 334 lb 12.8 oz (151.9 kg)   SpO2 97%   BMI 55.71 kg/m    Physical Exam  Constitutional: She is oriented to person, place, and time. She appears well-developed and well-nourished.  HENT:  Eyes normal. Ears normal. Nose mild swelling of nasal turbinates. Pharynx normal.  Neck: Neck supple. No thyromegaly present.  Cardiovascular:  S1 and S2 normal no murmurs  Pulmonary/Chest:  Clear to percussion and auscultation  Abdominal: Soft. There is no tenderness (no hepatosplenomegaly).  Lymphadenopathy:    She has no cervical adenopathy.  Neurological: She is alert and oriented to person, place, and time.  Skin:  She had hypepigmented papulosquamous areas in the back of her neck, anterior neck, breasts, right leg, left arm. There were no skin changes noted where  she had the possible mosquito bites  Psychiatric: She has a normal mood and affect. Her behavior is normal. Judgment and thought content normal.  Vitals reviewed.   Diagnostics: FVC 2.39 L FEV1 1.80 L. Predicted FVC 2.96 L predicted FEV1 2.37 L. After albuterol 2 puffs FVC 2.43 L FEV1 1.93 L-the spirometry is in the normal range and there was no significant improvement after albuterol however her FEF 25-75 improved to 52%  Allergy skin tests were positive to [place] grass , some molds and cockroach. Skin testing to foods was negative   Assessment  Assessment and Plan: 1. Toxic effect of venom  of ants, unintentional, subsequent encounter   2. 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 Other allergic rhinitis   4. Shortness of breath   5. Allergic urticaria   6. Exercise-induced bronchospasm   7. Morbid obesity with body mass index (BMI) of 50.0 to 59.9 in adult [facility])     Meds ordered this encounter  Medications  . triamcinolone ointment (KENALOG) 0.1 %    Sig: Apply twice a day to red itchy areas below the face.    Dispense:  453.6 g    Refill:  3  . EPINEPHrine (EPIPEN 2-PAK) 0.3 mg/0.3 mL IJ SOAJ injection    Sig: Use as directed for severe allergic reaction.    Dispense:  4 Device    Refill:  2    Patient Instructions  Environmental control of dust and mold Zyrtec 10 mg once a day if needed for runny nose or itching Fluticasone 2 sprays per nostril once a day if needed for stuffy nose Triamcinolone 0.1% ointment twice a day if needed to red itchy areas below the face Pro-air-2 puffs every 4 hours if needed for wheezing or coughing spells. She may use Pro-air 2 puffs 5-15 minutes before exercise Call me if you're not doing better on this treatment plan Let me know if you have another allergic reaction I will call you with the results of the blood test for allergy to mosquitoes and fire ants   If you have an allergic reaction take Benadryl 50 mg every 4 hours and if you have life-threatening symptoms inject  with EpiPen 0.3 mg. Then write what you  had to eat or drink in the previous 4 hours     Return if symptoms worsen or fail to improve.   Thank you for the opportunity to care for this patient.  Please do not hesitate to contact me with questions.  Penne Lash, M.D.  [facility] [address] [place], [place] [address] [phone]

## 2016-12-12 DIAGNOSIS — Z6841 Body Mass Index (BMI) 40.0 and over, adult: Secondary | ICD-10-CM

## 2016-12-12 DIAGNOSIS — L500 Allergic urticaria: Secondary | ICD-10-CM | POA: Insufficient documentation

## 2016-12-12 DIAGNOSIS — J45990 Exercise induced bronchospasm: Secondary | ICD-10-CM | POA: Insufficient documentation

## 2016-12-12 DIAGNOSIS — Z91038 Other insect allergy status: Secondary | ICD-10-CM | POA: Insufficient documentation

## 2016-12-12 DIAGNOSIS — T63421A Toxic effect of venom of ants, accidental (unintentional), initial encounter: Secondary | ICD-10-CM | POA: Insufficient documentation

## 2016-12-12 DIAGNOSIS — R0602 Shortness of breath: Secondary | ICD-10-CM | POA: Insufficient documentation

## 2016-12-12 DIAGNOSIS — J3089 Other allergic rhinitis: Secondary | ICD-10-CM | POA: Insufficient documentation

## 2016-12-16 LAB — I071-IGE WHOLE BODY: MOSQUITO: I071-IGE WHOLE BODY  MOSQUITO: 0.13 kU/L — AB

## 2016-12-16 LAB — ALLERGEN FIRE ANT: I070-IGE FIRE ANT (INVICTA): 45.4 kU/L — AB

## 2016-12-26 ENCOUNTER — Ambulatory Visit (INDEPENDENT_AMBULATORY_CARE_PROVIDER_SITE_OTHER): Payer: BC Managed Care – PPO | Admitting: *Deleted

## 2016-12-26 DIAGNOSIS — Z23 Encounter for immunization: Secondary | ICD-10-CM

## 2017-04-23 DIAGNOSIS — R7303 Prediabetes: Secondary | ICD-10-CM | POA: Insufficient documentation

## 2017-05-30 ENCOUNTER — Other Ambulatory Visit: Payer: Self-pay

## 2017-05-30 ENCOUNTER — Encounter: Payer: Self-pay | Admitting: Internal Medicine

## 2017-05-30 ENCOUNTER — Ambulatory Visit: Payer: BC Managed Care – PPO | Admitting: Internal Medicine

## 2017-05-30 VITALS — BP 128/80 | HR 87 | Temp 97.7°F | Ht 65.0 in | Wt 324.4 lb

## 2017-05-30 DIAGNOSIS — M545 Low back pain, unspecified: Secondary | ICD-10-CM

## 2017-05-30 MED ORDER — CYCLOBENZAPRINE HCL 10 MG PO TABS: 10.0000 mg | ORAL_TABLET | Freq: Every evening | ORAL | 0 refills | Status: DC | PRN

## 2017-05-30 NOTE — Patient Instructions (Signed)
[name],  I do think you have muscle spasm in your back. This should get better with time. Light stretching may help. Continue naproxen as needed. You may want to try flexeril before bed when you do not have to work the next day.  If pain does not resolve over the next week, please see [place] back.  Best, Dr. Ola Spurr

## 2017-05-31 NOTE — Progress Notes (Signed)
[facility] Progress Note  Subjective:  [name] is a 52 y.o. female with history of HTN, obesity, and recent severe bout of urticaria who presents for low back pain since Sunday morning. She reports symptoms started after she reached across her bed to answer the phone then when actually got up out of bed for the day had onset of pain. Walking seems to make it worse. It is worst in the mornings, then seems to get better throughout the day, then builds back up towards end of day. She does note improvement with naproxen. Pain has also been improving since yesterday. 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 Pain does not radiate down her legs. Often feels like a spasm.   ROS: No rash, no bowel or bladder incontinence, no dysuria   Allergies  Allergen Reactions  . Other     general anesthesia - upsets stomach   . Penicillins Hives and Itching    Has patient had a PCN reaction causing immediate rash, facial/tongue/throat swelling, SOB or lightheadedness with hypotension: Yes Has patient had a PCN reaction causing severe rash involving mucus membranes or skin necrosis: Yes Has patient had a PCN reaction that required hospitalization: No Has patient had a PCN reaction occurring within the last 10 years: No If all of the above answers are "NO", then may proceed with Cephalosporin use.     Social History   Tobacco Use  . Smoking status: Never Smoker  . Smokeless tobacco: Never Used  Substance Use Topics  . Alcohol use: No    Objective: Blood pressure 128/80, pulse 87, temperature 97.7 F (36.5 C), temperature source Oral, height 5\' 5"  (1.651 m), weight (!) 324 lb 6.4 oz (147.1 kg), SpO2 93 %. Body mass index is 53.98 kg/m. Constitutional: Pleasant, obese female in NAD Cardiovascular: RRR, S1, S2, no m/r/g.  Pulmonary/Chest: Effort  normal and breath sounds normal.  Musculoskeletal: TTP over lumbar paraspinal muscles and upper buttocks. No pain over SI joints. No pain over greater trochanters. Normal ROM with lateral rotation, back extension and flexion.  Neurological: AOx3, no focal deficits. Negative straight leg raise bilaterally. 2+ patellar reflexes bilaterally.  Vitals reviewed  Assessment/Plan: Acute bilateral low back pain without sciatica - Improving. Exam consistent with muscle spasm/strain. No midline spinal tenderness or paresthesias to suggest spinal cord/vertebral pathology.  - Recommended continuing NSAIDs prn. Ordered flexeril to try qhs.   Follow-up if no improvement.  Olene Floss, MD [place], PGY-3

## 2017-06-01 ENCOUNTER — Encounter: Payer: Self-pay | Admitting: Internal Medicine

## 2017-06-01 DIAGNOSIS — M545 Low back pain, unspecified: Secondary | ICD-10-CM | POA: Insufficient documentation

## 2017-06-01 NOTE — Assessment & Plan Note (Addendum)
-   Improving. Exam consistent with muscle spasm/strain. No midline spinal tenderness or paresthesias to suggest spinal cord/vertebral pathology. - Recommended continuing NSAIDs prn. Ordered flexeril to try qhs.

## 2017-06-26 ENCOUNTER — Other Ambulatory Visit: Payer: Self-pay

## 2017-06-26 ENCOUNTER — Ambulatory Visit: Payer: BC Managed Care – PPO | Admitting: Family Medicine

## 2017-06-26 ENCOUNTER — Encounter: Payer: Self-pay | Admitting: Family Medicine

## 2017-06-26 DIAGNOSIS — E7841 Elevated Lipoprotein(a): Secondary | ICD-10-CM | POA: Diagnosis not present

## 2017-06-26 DIAGNOSIS — E785 Hyperlipidemia, unspecified: Secondary | ICD-10-CM | POA: Insufficient documentation

## 2017-06-26 DIAGNOSIS — I10 Essential (primary) hypertension: Secondary | ICD-10-CM

## 2017-06-26 MED ORDER — ATORVASTATIN CALCIUM 40 MG PO TABS: 40.0000 mg | ORAL_TABLET | Freq: Every day | ORAL | 3 refills | Status: DC

## 2017-06-26 MED ORDER — HYDROCHLOROTHIAZIDE 25 MG PO TABS: ORAL_TABLET | ORAL | 3 refills | Status: DC

## 2017-06-26 NOTE — Assessment & Plan Note (Signed)
Continue care with bariatric clinic, congratulated on weight loss.

## 2017-06-26 NOTE — Patient Instructions (Signed)
It was a pleasure to see you today! Thank you for choosing [facility] for your primary care. [name] was seen for cholesterol.   Our plans for today were:  Start the lipitor/atorvastatin at night after work.   We will recheck your cholesterol in 6 weeks.    You should return to our clinic to see Dr. Lindell Noe in 6 weeks for cholesterol.   Best,  Dr. Lindell Noe

## 2017-06-26 NOTE — Progress Notes (Signed)
   CC: follow up weight, c/w HLD from bariatric clinic  HPI  Brings printed labs from bariatric clinic showing HLD and a1c of 5.7 from 04/19/17. She is concerned about her elevated LDL and total cholesterol. Initially hesitant to adding another medicine. Understands risk of MI/CVA from HLD.  Not sure about wanting another bariatric surgery, thinking about this. On an appetite suppressant from bariatric clinic. Down 19 lbs per our scale from 12/2016. She suggests she has lost more on her home scale. Has been exercising.  Wt Readings from Last 3 Encounters:  06/26/17 (!) 315 lb (142.9 kg)  05/30/17 (!) 324 lb 6.4 oz (147.1 kg)  12/11/16 (!) 334 lb 12.8 oz (151.9 kg)   ROS: Denies CP, SOB, abdominal pain, dysuria, changes in BMs.   CC, SH/smoking status, and VS noted  Objective: BP 126/78   Pulse 85   Temp 98.8 F (37.1 C) (Oral)   Ht 5\' 5"  (1.651 m)   Wt (!) 315 lb (142.9 kg)   SpO2 98%   BMI 52.42 kg/m  Gen: NAD, alert, cooperative, and pleasant. HEENT: NCAT, EOMI, PERRL CV: RRR, no murmur Resp: CTAB, no wheezes, non-labored Abd: SNTND, BS present, no guarding or organomegaly Ext: No edema, warm Neuro: Alert and oriented, Speech clear, No gross deficits  Assessment and plan:  HYPERTENSION, BENIGN SYSTEMIC BP well controlled today, patient reports high at home and wants refill. Refill HCTZ today.   Morbid obesity due to excess calories [facility]) Continue care with bariatric clinic, congratulated on weight loss.   HLD (hyperlipidemia) Persistently high LDL, ASCVD risk 5% and patient is concerned.Will start statin with above risk + LDL >160. Has been trying lifestyle changes for many years without change in her lipid profile. Recheck in 6 weeks.    No orders of the defined types were placed in this encounter.   Meds ordered this encounter  Medications  . atorvastatin (LIPITOR) 40 MG tablet    Sig: Take 1 tablet (40 mg total) by mouth daily.    Dispense:  90 tablet   Refill:  3  . hydrochlorothiazide (HYDRODIURIL) 25 MG tablet    Sig: take 25mg  by mouth once daily    Dispense:  90 tablet    Refill:  3   Ralene Ok, MD, PGY2 06/26/2017 8:54 PM

## 2017-06-26 NOTE — Assessment & Plan Note (Addendum)
Persistently high LDL, ASCVD risk 5% and patient is concerned.Will start statin with above risk + LDL >160. Has been trying lifestyle changes for many years without change in her lipid profile. Recheck in 6 weeks.

## 2017-06-26 NOTE — Assessment & Plan Note (Signed)
BP well controlled today, patient reports high at home and wants refill. Refill HCTZ today.

## 2017-07-18 ENCOUNTER — Ambulatory Visit: Payer: BC Managed Care – PPO | Admitting: Family Medicine

## 2017-07-18 ENCOUNTER — Encounter: Payer: Self-pay | Admitting: Family Medicine

## 2017-07-18 ENCOUNTER — Other Ambulatory Visit: Payer: Self-pay

## 2017-07-18 VITALS — BP 102/74 | HR 103 | Temp 98.0°F | Ht 65.0 in | Wt 308.6 lb

## 2017-07-18 DIAGNOSIS — L03311 Cellulitis of abdominal wall: Secondary | ICD-10-CM | POA: Insufficient documentation

## 2017-07-18 MED ORDER — CEPHALEXIN 500 MG PO CAPS: 500.0000 mg | ORAL_CAPSULE | Freq: Two times a day (BID) | ORAL | 0 refills | Status: DC

## 2017-07-18 NOTE — Assessment & Plan Note (Signed)
Rash seems most consistent with cellulitis.  Does not appear intertriginous.  Unlikely to be contact dermatitis due to focal nature of rash in an area that is not exposed to the environment.  Bacterial cellulitis more likely in moist, warm areas such as under patient's panus.  Will treat with Keflex 500 mg BID x 7 days.  Patient has penicillin allergy, but it involved a rash and itching when she was a girl and did not involve anaphylaxis.  Return precautions, such as worsening of rash, development of pus or increasing pain, and development of hives in response to antibiotic, were given to patient.

## 2017-07-18 NOTE — Patient Instructions (Addendum)
It was nice meeting you today [name]!  You likely have a skin infection causing your rash.  Please take cephalexin (Keflex), one pill in the morning and one pill in the evening, for 7 days.  Please call the clinic if your rash worsens or if you develop itchiness or hives from this medications.  If you have any questions or concerns, please feel free to call the clinic.   Be well,  Dr. Shan Levans  Cellulitis, Adult Cellulitis is a skin infection. The infected area is usually red and tender. This condition occurs most often in the arms and lower legs. The infection can travel to the muscles, blood, and underlying tissue and become serious. It is very important to get treated for this condition. What are the causes? Cellulitis is caused by bacteria. The bacteria enter through a break in the skin, such as a cut, burn, insect bite, open sore, or crack. What increases the risk? This condition is more likely to occur in people who:  Have a weak defense system (immune system).  Have open wounds on the skin such as cuts, burns, bites, and scrapes. Bacteria can enter the body through these open wounds.  Are older.  Have diabetes.  Have a type of long-lasting (chronic) liver disease (cirrhosis) or kidney disease.  Use IV drugs.  What are the signs or symptoms? Symptoms of this condition include:  Redness, streaking, or spotting on the skin.  Swollen area of the skin.  Tenderness or pain when an area of the skin is touched.  Warm skin.  Fever.  Chills.  Blisters.  How is this diagnosed? This condition is diagnosed based on a medical history and physical exam. You may also have tests, including:  Blood tests.  Lab tests.  Imaging tests.  How is this treated? Treatment for this condition may include:  Medicines, such as antibiotic medicines or antihistamines.  Supportive care, such as rest and application of cold or warm cloths (cold or warm compresses) to the  skin.  Hospital care, if the condition is severe.  The infection usually gets better within 1-2 days of treatment. Follow these instructions at home:  Take over-the-counter and prescription medicines only as told by your health care provider.  If you were prescribed an antibiotic medicine, take it as told by your health care provider. Do not stop taking the antibiotic even if you start to feel better.  Drink enough fluid to keep your urine clear or pale yellow.  Do not touch or rub the infected area.  Raise (elevate) the infected area above the level of your heart while you are sitting or lying down.  Apply warm or cold compresses to the affected area as told by your health care provider.  Keep all follow-up visits as told by your health care provider. This is important. These visits let your health care provider make sure a more serious infection is not developing. Contact a health care provider if:  You have a fever.  Your symptoms do not improve within 1-2 days of starting treatment.  Your bone or joint underneath the infected area becomes painful after the skin has healed.  Your infection returns in the same area or another area.  You notice a swollen bump in the infected area.  You develop new symptoms.  You have a general ill feeling (malaise) with muscle aches and pains. Get help right away if:  Your symptoms get worse.  You feel very sleepy.  You develop vomiting or diarrhea  that persists.  You notice red streaks coming from the infected area.  Your red area gets larger or turns dark in color. This information is not intended to replace advice given to you by your health care provider. Make sure you discuss any questions you have with your health care provider. Document Released: 11/29/2004 Document Revised: 06/30/2015 Document Reviewed: 12/29/2014 Elsevier Interactive Patient Education  [employer].

## 2017-07-18 NOTE — Progress Notes (Signed)
   Subjective:    [name] - 52 y.o. female MRN [id number]  Date of birth: 08-20-1965  HPI  [name] is here for a rash.  She felt an area of irritation in her lower abdomen 2-3 days ago and noticed that the area had become red soon after.  The area then extended to her upper thigh and became a reddish-purple color.  She denies any drainage or pus from the area.  She denies fever or chills.  The rash is somewhat itchy and somewhat painful.  She has never had anything similar before.       Health Maintenance:  There are no preventive care reminders to display for this patient.  -  reports that she has never smoked. She has never used smokeless tobacco. - Review of Systems: Per HPI. - Past Medical History: Patient Active Problem List   Diagnosis Date Noted  . Cellulitis of abdominal wall 07/18/2017  . HLD (hyperlipidemia) 06/26/2017  . Other allergic rhinitis 12/12/2016  . 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 Toxic effect of venom of ants, unintentional 12/12/2016  . Exercise-induced bronchospasm 12/12/2016  . Acute urticaria 11/28/2016  . Acid reflux 04/18/2015  . 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 Anxiety 11/24/2010  . Headache(784.0) 10/20/2010  . NEOPLASM, MALIGNANT, OVARY, HX OF 12/05/2009  . CONSTIPATION, CHRONIC 09/06/2009  . POSTMENOPAUSAL ATROPHIC VAGINITIS 05/24/2009  . EXTERNAL HEMORRHOIDS 10/08/2007  . DIVERTICULOSIS, COLON 10/08/2007  . Morbid obesity due to excess calories ([place]) 05/02/2006  . HYPERTENSION, BENIGN SYSTEMIC 05/02/2006   - Medications: reviewed and updated   Objective:   Physical Exam BP 102/74   Pulse (!) 103   Temp 98 F (36.7 C) (Oral)   Ht 5\' 5"  (1.651 m)   Wt (!) 308 lb 9.6 oz (140 kg)   SpO2 96%   BMI 51.35 kg/m  Gen: NAD, alert, cooperative with exam, well-appearing, pleasant HEENT: NCAT, clear  conjunctiva, supple neck CV: RRR, good S1/S2, no murmur, no edema Resp: CTABL, no wheezes, non-labored Abd: SNTND, BS present, no guarding or organomegaly Skin: erythematous area 3 cm x 1 cm under L side of abdominal panus, extending to L upper thigh.  Some blistering of the skin overlying the erythematous area.  No streaking or draining areas.  Minimally tender to palpation. Neuro: no gross deficits.  Psych: good insight, alert and oriented        Assessment & Plan:   Cellulitis of abdominal wall Rash seems most consistent with cellulitis.  Does not appear intertriginous.  Unlikely to be contact dermatitis due to focal nature of rash in an area that is not exposed to the environment.  Bacterial cellulitis more likely in moist, warm areas such as under patient's panus.  Will treat with Keflex 500 mg BID x 7 days.  Patient has penicillin allergy, but it involved a rash and itching when she was a girl and did not involve anaphylaxis.  Return precautions, such as worsening of rash, development of pus or increasing pain, and development of hives in response to antibiotic, were given to patient.    Maia Breslow, M.D. 07/18/2017, 4:20 PM PGY-1, [place]

## 2017-08-30 ENCOUNTER — Encounter: Payer: Self-pay | Admitting: Family Medicine

## 2017-08-30 ENCOUNTER — Ambulatory Visit: Payer: BC Managed Care – PPO | Admitting: Family Medicine

## 2017-08-30 VITALS — BP 122/78 | HR 77 | Temp 97.8°F | Ht 65.0 in | Wt 296.2 lb

## 2017-08-30 DIAGNOSIS — I10 Essential (primary) hypertension: Secondary | ICD-10-CM

## 2017-08-30 DIAGNOSIS — Z9884 Bariatric surgery status: Secondary | ICD-10-CM

## 2017-08-30 DIAGNOSIS — E559 Vitamin D deficiency, unspecified: Secondary | ICD-10-CM | POA: Insufficient documentation

## 2017-08-30 DIAGNOSIS — E7841 Elevated Lipoprotein(a): Secondary | ICD-10-CM | POA: Diagnosis not present

## 2017-08-30 LAB — POCT GLYCOSYLATED HEMOGLOBIN (HGB A1C): Hemoglobin A1C: 5.4 % (ref 4.0–5.6)

## 2017-08-30 NOTE — Patient Instructions (Addendum)
It was a pleasure to see you today! Thank you for choosing [facility] for your primary care. [name] was seen for blood pressure, cholesterol.   Our plans for today were:  Keep up the great work with your fitness, I am so excited for you!   I will call you with your labs results and Dr. Andrey Farmer can see them in the computer as well.   Your bump looks like an epidermal cyst, which is not cancerous or worrisome, at some point it may drain some sweat gland material, but it is not infected. No need to biopsy or work up further unless it grows or gets red or warm.   Come back and see me in 6 months, and I can't wait to see how you are doing with fitness then!   Best,  Dr. Lindell Noe

## 2017-08-30 NOTE — Progress Notes (Signed)
   CC: HTN, HLD  HPI  HTN - taking HCTZ, wants [place] to recheck today as she was frustrated coming in. Earlier was less at bariatrics.   HLD - been taking lipitor. Wants lipids rechecked.   Wt Readings from Last 3 Encounters:  08/30/17 296 lb 3.2 oz (134.4 kg)  07/18/17 (!) 308 lb 9.6 oz (140 kg)  06/26/17 (!) 315 lb (142.9 kg)   Weight - down 19 lbs since April. Seeing bariatics at [facility]. Using dietician. Using lose it app faithfully, using fitbit daily.   Vaginal lesion - a few months, not draining. Rubbed on her shapewear. No new partner x 15 years.  ROS: Denies CP, SOB, abdominal pain, dysuria, changes in BMs.   CC, SH/smoking status, and VS noted  Objective: BP 122/78   Pulse 77   Temp 97.8 F (36.6 C) (Oral)   Ht 5\' 5"  (1.651 m)   Wt 296 lb 3.2 oz (134.4 kg)   SpO2 98%   BMI 49.29 kg/m  Gen: NAD, alert, cooperative, and pleasant. HEENT: NCAT, EOMI, PERRL CV: RRR, no murmur Resp: CTAB, no wheezes, non-labored Abd: SNTND, BS present, no guarding or organomegaly GU: normal external female genitalia except for 1cm pearly lesion at R upper labia minora nontender, nonfluctuance, not draining Ext: No edema, warm Neuro: Alert and oriented, Speech clear, No gross deficits  Assessment and plan:  HYPERTENSION, BENIGN SYSTEMIC BP well controlled after recheck, refill HCTZ and check BMP.   H/O laparoscopic adjustable gastric banding Check vitamin D today  HLD (hyperlipidemia) Recheck lipids with healthier diet and weight loss.   Vaginal lesion - appears to be epidermal cyst. Less likely STI with no new partners and nontender. Continue to monitor and consider OB referral if bothersome or persistent.   Orders Placed This Encounter  Procedures  . Lipid panel  . Vitamin D 1,25 dihydroxy  . CBC  . Basic metabolic panel  . POCT glycosylated hemoglobin (Hb A1C)    No orders of the defined types were placed in this encounter.   Ralene Ok, MD, PGY2 09/03/2017 8:39  AM

## 2017-09-03 NOTE — Assessment & Plan Note (Signed)
Recheck lipids with healthier diet and weight loss.

## 2017-09-03 NOTE — Assessment & Plan Note (Signed)
Check vitamin D today. 

## 2017-09-03 NOTE — Assessment & Plan Note (Signed)
BP well controlled after recheck, refill HCTZ and check BMP.

## 2017-09-13 ENCOUNTER — Encounter: Payer: Self-pay | Admitting: Family Medicine

## 2017-09-14 LAB — BASIC METABOLIC PANEL
BUN/Creatinine Ratio: 22 (ref 9–23)
BUN: 13 mg/dL (ref 6–24)
CO2: 26 mmol/L (ref 20–29)
CREATININE: 0.59 mg/dL (ref 0.57–1.00)
Calcium: 9.8 mg/dL (ref 8.7–10.2)
Chloride: 102 mmol/L (ref 96–106)
GFR calc Af Amer: 121 mL/min/{1.73_m2} (ref >59)
GFR calc non Af Amer: 105 mL/min/{1.73_m2} (ref >59)
GLUCOSE: 78 mg/dL (ref 65–99)
Potassium: 3.9 mmol/L (ref 3.5–5.2)
Sodium: 142 mmol/L (ref 134–144)

## 2017-09-14 LAB — CBC
HEMOGLOBIN: 12.7 g/dL (ref 11.1–15.9)
Hematocrit: 37.7 % (ref 34.0–46.6)
MCH: 27.9 pg (ref 26.6–33.0)
MCHC: 33.7 g/dL (ref 31.5–35.7)
MCV: 83 fL (ref 79–97)
Platelets: 322 10*3/uL (ref 150–450)
RBC: 4.56 x10E6/uL (ref 3.77–5.28)
RDW: 14.5 % (ref 12.3–15.4)
WBC: 7 10*3/uL (ref 3.4–10.8)

## 2017-09-14 LAB — VITAMIN D 1,25 DIHYDROXY
VITAMIN D2 1, 25 (OH): 35 pg/mL
VITAMIN D3 1, 25 (OH): 22 pg/mL
Vitamin D 1, 25 (OH)2 Total: 57 pg/mL

## 2017-09-14 LAB — LIPID PANEL
Chol/HDL Ratio: 2.4 ratio (ref 0.0–4.4)
Cholesterol, Total: 136 mg/dL (ref 100–199)
HDL: 56 mg/dL (ref >39)
LDL Calculated: 67 mg/dL (ref 0–99)
Triglycerides: 63 mg/dL (ref 0–149)
VLDL Cholesterol Cal: 13 mg/dL (ref 5–40)

## 2017-09-17 ENCOUNTER — Encounter: Payer: Self-pay | Admitting: Family Medicine

## 2017-10-09 ENCOUNTER — Ambulatory Visit: Payer: BC Managed Care – PPO | Admitting: Family Medicine

## 2017-10-09 ENCOUNTER — Other Ambulatory Visit: Payer: Self-pay

## 2017-10-09 VITALS — BP 110/80 | HR 80 | Temp 98.1°F | Wt 282.0 lb

## 2017-10-09 DIAGNOSIS — R220 Localized swelling, mass and lump, head: Secondary | ICD-10-CM | POA: Diagnosis not present

## 2017-10-09 MED ORDER — DEXAMETHASONE SODIUM PHOSPHATE 10 MG/ML IJ SOLN
10.0000 mg | Freq: Once | INTRAMUSCULAR | Status: AC
  Administered 2017-10-09: 10 mg via INTRAMUSCULAR

## 2017-10-09 NOTE — Patient Instructions (Signed)
   It was nice to see you today. Please take an over the counter allergy medication like benadryl, claritin, zyrtec, or allegra once a day.  If you develop further lip swelling, any difficulty swallowing or breathing please use your epipen and call 911 to go to ED immediately.   If you have questions or concerns please do not hesitate to call at [phone].  Lucila Maine, DO PGY-3, [facility] 10/09/2017 11:38 AM

## 2017-10-09 NOTE — Progress Notes (Signed)
    Subjective:    Patient ID: [name], female    DOB: 20-Aug-1965, 52 y.o.   MRN: [id number]   CC: lips swelling  HPI:  Patient noticed small bumps on her upper lips 2 days ago that progressed to diffuse swelling yesterday and persisted to this morning. She is unsure what is causing this, no new cosmetics, toothpaste, lotions, soaps, etc. She has severe allergy to mosquito and ant bites but denies any bites. She has an epipen at home for bug bites. She has tried a new drink "concentrated coffee with coconut flavoring" but otherwise no new foods or drinks. Her lips do not itch. No issue swallowing or breathing. No rash anywhere. She did have a "scratchy throat' this morning that went away.   She takes an appetite suppressant and HCTZ. She takes lipitor intermittently. No other medications or supplements.  Smoking status reviewed- non-smoker  Review of Systems- see HPI   Objective:  BP 110/80   Pulse 80   Temp 98.1 F (36.7 C) (Oral)   Wt 282 lb (127.9 kg)   SpO2 97%   BMI 46.93 kg/m  Vitals and nursing note reviewed  General: well appearing, well nourished, in no acute distress HEENT: normocephalic, MMM. Upper lip swollen R>L. No oral lesions or mucosal involvement.  Neck: supple, non-tender, without lymphadenopathy Respiratory: clear to auscultation bilaterally, no increased work of breathing Extremities: no edema or cyanosis Skin: warm and dry, no rashes noted over extremities, abdomen, or trunk Neuro: alert and oriented, no focal deficits   Assessment & Plan:   1. Lip swelling Unclear etiology, only new exposure is flavored coffee drink. Patient will stop drinking this. No airway compromise. She is well appearing with stable vital signs. Will give shot of decadron in office as she has had this several times in the past for various allergic reactions. She has epi-pen at home, advised if she worsens in any way to use this and go to ED. She is agreeable. Also advised  her to take antihistamine OTC. - dexamethasone (DECADRON) injection 10 mg   Return if symptoms worsen or fail to improve.   Lucila Maine, DO Family Medicine Resident PGY-3

## 2017-12-11 ENCOUNTER — Other Ambulatory Visit: Payer: Self-pay | Admitting: Family Medicine

## 2017-12-11 ENCOUNTER — Ambulatory Visit (INDEPENDENT_AMBULATORY_CARE_PROVIDER_SITE_OTHER): Payer: BC Managed Care – PPO

## 2017-12-11 DIAGNOSIS — Z23 Encounter for immunization: Secondary | ICD-10-CM

## 2017-12-11 DIAGNOSIS — Z1231 Encounter for screening mammogram for malignant neoplasm of breast: Secondary | ICD-10-CM

## 2018-01-13 ENCOUNTER — Ambulatory Visit: Payer: BC Managed Care – PPO

## 2018-01-19 ENCOUNTER — Ambulatory Visit (HOSPITAL_COMMUNITY)
Admission: EM | Admit: 2018-01-19 | Discharge: 2018-01-19 | Disposition: A | Discharge status: Home / Self Care | Payer: BC Managed Care – PPO | Attending: Family Medicine | Admitting: Family Medicine

## 2018-01-19 ENCOUNTER — Other Ambulatory Visit: Payer: Self-pay

## 2018-01-19 ENCOUNTER — Encounter (HOSPITAL_COMMUNITY): Payer: Self-pay | Admitting: Emergency Medicine

## 2018-01-19 DIAGNOSIS — Z79899 Other long term (current) drug therapy: Secondary | ICD-10-CM | POA: Diagnosis not present

## 2018-01-19 DIAGNOSIS — J029 Acute pharyngitis, unspecified: Secondary | ICD-10-CM | POA: Diagnosis present

## 2018-01-19 DIAGNOSIS — Z8543 Personal history of malignant neoplasm of ovary: Secondary | ICD-10-CM | POA: Insufficient documentation

## 2018-01-19 DIAGNOSIS — F419 Anxiety disorder, unspecified: Secondary | ICD-10-CM | POA: Diagnosis not present

## 2018-01-19 DIAGNOSIS — K589 Irritable bowel syndrome without diarrhea: Secondary | ICD-10-CM | POA: Insufficient documentation

## 2018-01-19 DIAGNOSIS — I10 Essential (primary) hypertension: Secondary | ICD-10-CM | POA: Insufficient documentation

## 2018-01-19 DIAGNOSIS — R0981 Nasal congestion: Secondary | ICD-10-CM | POA: Insufficient documentation

## 2018-01-19 DIAGNOSIS — E785 Hyperlipidemia, unspecified: Secondary | ICD-10-CM | POA: Insufficient documentation

## 2018-01-19 DIAGNOSIS — J069 Acute upper respiratory infection, unspecified: Secondary | ICD-10-CM | POA: Diagnosis not present

## 2018-01-19 DIAGNOSIS — Z9884 Bariatric surgery status: Secondary | ICD-10-CM | POA: Diagnosis not present

## 2018-01-19 DIAGNOSIS — B9789 Other viral agents as the cause of diseases classified elsewhere: Secondary | ICD-10-CM

## 2018-01-19 DIAGNOSIS — Z880 Allergy status to penicillin: Secondary | ICD-10-CM | POA: Insufficient documentation

## 2018-01-19 DIAGNOSIS — Z8249 Family history of ischemic heart disease and other diseases of the circulatory system: Secondary | ICD-10-CM | POA: Insufficient documentation

## 2018-01-19 LAB — POCT RAPID STREP A: STREPTOCOCCUS, GROUP A SCREEN (DIRECT): NEGATIVE

## 2018-01-19 MED ORDER — HYDROCODONE-HOMATROPINE 5-1.5 MG/5ML PO SYRP
5.0000 mL | ORAL_SOLUTION | Freq: Four times a day (QID) | ORAL | 0 refills | Status: DC | PRN
Start: 2018-01-19 — End: 2018-05-20

## 2018-01-19 NOTE — ED Provider Notes (Signed)
[place]    CSN: [id number] Arrival date & time: 01/19/18  1525     History   Chief Complaint Chief Complaint  Patient presents with  . URI    HPI [name] is a 52 y.o. female.   52 year old established patient suffering from nasal congestion and drainage, cough, left ear discomfort and a sore throat since Wednesday (4 days).  Pt has been drinking hot coffee and hot chocolate and throat spray.  Patient had been to a [employer] in [place] 4 days ago where there were 1300 people.     Past Medical History:  Diagnosis Date  . 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 Complication of anesthesia    UPSETS STOMACH  . Hyperlipidemia   . Hypertension   . IBS (irritable bowel syndrome)   . Morbid obesity ([place])   . PONV (postoperative nausea and vomiting)   . Sinus complaint   . Urticaria   . Wears glasses     Patient Active Problem List   Diagnosis Date Noted  . HLD (hyperlipidemia) 06/26/2017  . Other allergic rhinitis 12/12/2016  . 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 Toxic effect of venom of ants, unintentional 12/12/2016  . Exercise-induced bronchospasm 12/12/2016  . Acid reflux 04/18/2015  . 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 Anxiety 11/24/2010  . NEOPLASM, MALIGNANT, OVARY, HX OF 12/05/2009  . CONSTIPATION, CHRONIC 09/06/2009  . POSTMENOPAUSAL ATROPHIC VAGINITIS 05/24/2009  . EXTERNAL HEMORRHOIDS 10/08/2007  . DIVERTICULOSIS, COLON 10/08/2007  . Morbid obesity due to excess calories ([place]) 05/02/2006  . HYPERTENSION, BENIGN SYSTEMIC 05/02/2006    Past Surgical History:  Procedure Laterality Date  . ABDOMINAL HYSTERECTOMY     COMPLETE  . CHOLECYSTECTOMY  1990  . COLONOSCOPY WITH PROPOFOL N/A 08/22/2016   Procedure: COLONOSCOPY WITH PROPOFOL;  Surgeon: Arta Silence, MD;   Location: [facility];  Service: Endoscopy;  Laterality: N/A;  . ESOPHAGEAL MANOMETRY N/A 07/06/2013   Procedure: ESOPHAGEAL MANOMETRY (EM);  Surgeon: Arta Silence, MD;  Location: [facility];  Service: Endoscopy;  Laterality: N/A;  . GASTRIC RESTRICTION SURGERY  2010   lap band  . HEMORROIDECTOMY      OB History    Gravida  2   Para  2   Term  2   Preterm      AB      Living  2     SAB      TAB      Ectopic      Multiple      Live Births               Home Medications    Prior to Admission medications   Medication Sig Start Date End Date Taking? Authorizing Provider  hydrochlorothiazide (HYDRODIURIL) 25 MG tablet take 25mg  by mouth once daily 06/26/17  Yes Sela Hilding, MD  atorvastatin (LIPITOR) 40 MG tablet Take 1 tablet (40 mg total) by mouth daily. 06/26/17   Sela Hilding, MD  EPINEPHrine (EPIPEN 2-PAK) 0.3 mg/0.3 mL IJ SOAJ injection Use as directed for severe allergic reaction. 12/11/16   Charlies Silvers, MD    Family History Family History  Problem Relation Age of Onset  . Thyroid cancer Mother   . Hypertension Mother   . Hyperlipidemia Mother   . Breast cancer Unknown        aunts  .  Breast cancer Maternal Aunt   . Breast cancer Maternal Aunt   . Asthma Sister   . Allergic rhinitis Neg Hx   . Angioedema Neg Hx   . Eczema Neg Hx   . Immunodeficiency Neg Hx   . Urticaria Neg Hx     Social History Social History   Tobacco Use  . Smoking status: Never Smoker  . Smokeless tobacco: Never Used  Substance Use Topics  . Alcohol use: No  . Drug use: No     Allergies   Other and Penicillins   Review of Systems Review of Systems  Constitutional: Positive for chills and diaphoresis.  HENT: Positive for congestion and sore throat.   Respiratory: Positive for cough.   All other systems reviewed and are negative.    Physical Exam Triage Vital Signs ED Triage Vitals  Enc Vitals Group     BP 01/19/18 1609 136/85      Pulse Rate 01/19/18 1609 91     Resp --      Temp 01/19/18 1609 98.7 F (37.1 C)     Temp Source 01/19/18 1609 Oral     SpO2 01/19/18 1609 99 %     Weight --      Height --      Head Circumference --      Peak Flow --      Pain Score 01/19/18 1607 6     Pain Loc --      Pain Edu? --      Excl. in [place]? --    No data found.  Updated Vital Signs BP 136/85 (BP Location: Left Arm)   Pulse 91   Temp 98.7 F (37.1 C) (Oral)   SpO2 99%    Physical Exam  Constitutional: She is oriented to person, place, and time. She appears well-developed and well-nourished.  HENT:  Head: Normocephalic.  Right Ear: External ear normal.  Left Ear: External ear normal.  Mouth/Throat: Oropharynx is clear and moist.  Eyes: Conjunctivae are normal.  Neck: Normal range of motion. Neck supple.  Cardiovascular: Normal rate, regular rhythm and normal heart sounds.  Pulmonary/Chest: Effort normal and breath sounds normal.  Musculoskeletal: Normal range of motion.  Lymphadenopathy:    She has no cervical adenopathy.  Neurological: She is alert and oriented to person, place, and time.  Skin: Skin is warm and dry.  Nursing note and vitals reviewed.    UC Treatments / Results  Labs (all labs ordered are listed, but only abnormal results are displayed) Labs Reviewed - No data to display  EKG None  Radiology No results found.  Procedures Procedures (including critical care time)  Medications Ordered in UC Medications - No data to display  Initial Impression / Assessment and Plan / UC Course  I have reviewed the triage vital signs and the nursing notes.  Pertinent labs & imaging results that were available during my care of the patient were reviewed by me and considered in my medical decision making (see chart for details).    Final Clinical Impressions(s) / UC Diagnoses   Final diagnoses:  None   Discharge Instructions   None    ED Prescriptions    None     Controlled Substance  Prescriptions [place] Controlled Substance Registry consulted? Not Applicable   Robyn Haber, MD 01/19/18 [phone]

## 2018-01-19 NOTE — Discharge Instructions (Signed)
Strep test is negative.

## 2018-01-19 NOTE — ED Triage Notes (Signed)
Pt has been suffering from nasal congestion and drainage, cough, left ear discomfort and a sore throat since Wednesday.  Pt has been drinking hot coffee and hot chocolate and throat spray.

## 2018-01-22 LAB — CULTURE, GROUP A STREP (THRC)

## 2018-02-18 ENCOUNTER — Encounter: Payer: Self-pay | Admitting: Sports Medicine

## 2018-02-18 ENCOUNTER — Ambulatory Visit (INDEPENDENT_AMBULATORY_CARE_PROVIDER_SITE_OTHER): Payer: BC Managed Care – PPO

## 2018-02-18 ENCOUNTER — Other Ambulatory Visit: Payer: Self-pay

## 2018-02-18 ENCOUNTER — Ambulatory Visit: Payer: BC Managed Care – PPO | Admitting: Sports Medicine

## 2018-02-18 VITALS — BP 152/94 | HR 94

## 2018-02-18 DIAGNOSIS — G5762 Lesion of plantar nerve, left lower limb: Secondary | ICD-10-CM | POA: Diagnosis not present

## 2018-02-18 DIAGNOSIS — M779 Enthesopathy, unspecified: Principal | ICD-10-CM

## 2018-02-18 DIAGNOSIS — M2042 Other hammer toe(s) (acquired), left foot: Secondary | ICD-10-CM | POA: Diagnosis not present

## 2018-02-18 DIAGNOSIS — M79672 Pain in left foot: Secondary | ICD-10-CM | POA: Diagnosis not present

## 2018-02-18 DIAGNOSIS — M7752 Other enthesopathy of left foot: Secondary | ICD-10-CM

## 2018-02-18 DIAGNOSIS — M778 Other enthesopathies, not elsewhere classified: Secondary | ICD-10-CM

## 2018-02-18 MED ORDER — TRIAMCINOLONE ACETONIDE 10 MG/ML IJ SUSP
10.0000 mg | Freq: Once | INTRAMUSCULAR | Status: AC
  Administered 2018-02-18: 10 mg

## 2018-02-18 NOTE — Progress Notes (Addendum)
Subjective: [name] is a 52 y.o. female patient who presents to office for evaluation of LEFT foot pain. Patient complains of progressive pain especially over the last ball>5th toe on left foot. Ranks pain 7/10 and is now interferring with daily activities of burning to ball worse with walking barefoot. Patient has tried changing of shoes with no relief in symptoms. Patient denies any other pedal complaints. Denies injury/trip/fall/sprain/any causative factors.   Review of Systems  All other systems reviewed and are negative.   Patient Active Problem List   Diagnosis Date Noted  . Vitamin D deficiency 08/30/2017  . HLD (hyperlipidemia) 06/26/2017  . Pre-diabetes 04/23/2017  . Other allergic rhinitis 12/12/2016  . 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 Toxic effect of venom of ants, unintentional 12/12/2016  . Exercise-induced bronchospasm 12/12/2016  . Acid reflux 04/18/2015  . 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 Anxiety 11/24/2010  . NEOPLASM, MALIGNANT, OVARY, HX OF 12/05/2009  . CONSTIPATION, CHRONIC 09/06/2009  . POSTMENOPAUSAL ATROPHIC VAGINITIS 05/24/2009  . EXTERNAL HEMORRHOIDS 10/08/2007  . DIVERTICULOSIS, COLON 10/08/2007  . Morbid obesity due to excess calories ([place]) 05/02/2006  . HYPERTENSION, BENIGN SYSTEMIC 05/02/2006    Current Outpatient Medications on File Prior to Visit  Medication Sig Dispense Refill  . atorvastatin (LIPITOR) 40 MG tablet Take 1 tablet (40 mg total) by mouth daily. (Patient not taking: Reported on 02/18/2018) 90 tablet 3  . EPINEPHrine (EPIPEN 2-PAK) 0.3 mg/0.3 mL IJ SOAJ injection Use as directed for severe allergic reaction. 4 Device 2  . hydrochlorothiazide (HYDRODIURIL) 25 MG tablet take 53m by mouth once daily 90 tablet 3  . HYDROcodone-homatropine (HYDROMET) 5-1.5 MG/5ML syrup Take 5 mLs by mouth every 6 (six) hours as  needed for cough. (Patient not taking: Reported on 02/18/2018) 60 mL 0   No current facility-administered medications on file prior to visit.     Allergies  Allergen Reactions  . Other     general anesthesia - upsets stomach   . Penicillins Hives and Itching    Has patient had a PCN reaction causing immediate rash, facial/tongue/throat swelling, SOB or lightheadedness with hypotension: Yes Has patient had a PCN reaction causing severe rash involving mucus membranes or skin necrosis: Yes Has patient had a PCN reaction that required hospitalization: No Has patient had a PCN reaction occurring within the last 10 years: No If all of the above answers are "NO", then may proceed with Cephalosporin use.     Objective:  General: Alert and oriented x3 in no acute distress  Dermatology: No open lesions bilateral lower extremities, no webspace macerations, no ecchymosis bilateral, all nails x 10 are well manicured.  Vascular: Dorsalis Pedis and Posterior Tibial pedal pulses palpable, Capillary Fill Time 3 seconds,(+) pedal hair growth bilateral, no edema bilateral lower extremities, Temperature gradient within normal limits.  Neurology: GJohney Mainesensation intact via light touch bilateral.   Musculoskeletal: Mild tenderness with palpation at ball of foot especially at 3rd interspace on left>5th hammertoe on left. Strength within normal limits in all groups bilateral.   Gait: Antalgic gait  Xrays  Left Foot   Impression:5th hammertoe, pes planus with inferior heel spur. No other acute findings.  Assessment and Plan: Problem List Items Addressed This Visit    None    Visit Diagnoses    Capsulitis of foot, left    -  Primary   Relevant Medications   triamcinolone  acetonide (KENALOG) 10 MG/ML injection 10 mg (Completed) (Start on 02/18/2018 10:00 PM)   Other Relevant Orders   DG Foot Complete Left (Completed)   Morton neuroma, left       Relevant Medications   triamcinolone acetonide  (KENALOG) 10 MG/ML injection 10 mg (Completed) (Start on 02/18/2018 10:00 PM)   Hammer toe of left foot       Left foot pain       Relevant Medications   triamcinolone acetonide (KENALOG) 10 MG/ML injection 10 mg (Completed) (Start on 02/18/2018 10:00 PM)       -Complete examination performed -Xrays reviewed -Discussed treatement options for 5th hammertoe and likely capsulitis vs neuroma at 3rd webspace on left -After oral consent and aseptic prep, injected a mixture containing 1 ml of 2%  plain lidocaine, 1 ml 0.5% plain marcaine, 0.5 ml of kenalog 10 and 0.5 ml of dexamethasone phosphate into left 3rd interspace at level of 3-4 MTPJs without complication. Post-injection care discussed with patient.  -Advised patient to refrain from walking barefoot  -Rx Met pad -Patient wishes to consider hammertoe surgery in the summer  -Patient to return to office as needed or sooner if condition worsens.  Landis Martins, DPM

## 2018-04-01 ENCOUNTER — Encounter: Payer: Self-pay | Admitting: Sports Medicine

## 2018-04-01 ENCOUNTER — Ambulatory Visit: Payer: BC Managed Care – PPO | Admitting: Sports Medicine

## 2018-04-01 DIAGNOSIS — M2042 Other hammer toe(s) (acquired), left foot: Secondary | ICD-10-CM

## 2018-04-01 DIAGNOSIS — M778 Other enthesopathies, not elsewhere classified: Secondary | ICD-10-CM

## 2018-04-01 DIAGNOSIS — Z6841 Body Mass Index (BMI) 40.0 and over, adult: Secondary | ICD-10-CM

## 2018-04-01 DIAGNOSIS — G5762 Lesion of plantar nerve, left lower limb: Secondary | ICD-10-CM

## 2018-04-01 DIAGNOSIS — M722 Plantar fascial fibromatosis: Secondary | ICD-10-CM

## 2018-04-01 DIAGNOSIS — M79672 Pain in left foot: Secondary | ICD-10-CM

## 2018-04-01 DIAGNOSIS — M779 Enthesopathy, unspecified: Secondary | ICD-10-CM

## 2018-04-01 MED ORDER — TRIAMCINOLONE ACETONIDE 10 MG/ML IJ SUSP
10.0000 mg | Freq: Once | INTRAMUSCULAR | Status: AC
  Administered 2018-04-01: 10 mg

## 2018-04-01 NOTE — Progress Notes (Signed)
Subjective: [name] is a 53 y.o. female patient who presents to office for evaluation of LEFT foot pain. Patient reports the pain at the ball of her foot is a little better but now pain is worse at her heel; pain is so bad that she can not put her heel on the floor in the morning and had to start using CAM boot that she has at home. Patient reports that she walks barefoot with pressure to the ball because her heel is hurting and reports that this pain started slowly to develop in her heel after last visit. Patient denies any other pedal complaints. Denies new injury/trip/fall/sprain/any other causative factors.   Patient Active Problem List   Diagnosis Date Noted  . Vitamin D deficiency 08/30/2017  . HLD (hyperlipidemia) 06/26/2017  . Pre-diabetes 04/23/2017  . Other allergic rhinitis 12/12/2016  . 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 Toxic effect of venom of ants, unintentional 12/12/2016  . Exercise-induced bronchospasm 12/12/2016  . Acid reflux 04/18/2015  . 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 Anxiety 11/24/2010  . NEOPLASM, MALIGNANT, OVARY, HX OF 12/05/2009  . CONSTIPATION, CHRONIC 09/06/2009  . POSTMENOPAUSAL ATROPHIC VAGINITIS 05/24/2009  . EXTERNAL HEMORRHOIDS 10/08/2007  . DIVERTICULOSIS, COLON 10/08/2007  . Morbid obesity due to excess calories ([place]) 05/02/2006  . HYPERTENSION, BENIGN SYSTEMIC 05/02/2006    Current Outpatient Medications on File Prior to Visit  Medication Sig Dispense Refill  . atorvastatin (LIPITOR) 40 MG tablet Take 1 tablet (40 mg total) by mouth daily. 90 tablet 3  . azithromycin (ZITHROMAX) 250 MG tablet     . BELVIQ 10 MG TABS   1  . clindamycin (CLINDAGEL) 1 % gel APP EXT TO FACE IN THE MORNING    . EPINEPHrine (EPIPEN 2-PAK) 0.3 mg/0.3 mL IJ SOAJ injection Use as directed for severe allergic reaction. 4 Device 2  .  hydrochlorothiazide (HYDRODIURIL) 25 MG tablet take 25mg  by mouth once daily 90 tablet 3  . HYDROcodone-homatropine (HYDROMET) 5-1.5 MG/5ML syrup Take 5 mLs by mouth every 6 (six) hours as needed for cough. 60 mL 0  . Lorcaserin HCl 10 MG TABS Take by mouth.    . naproxen (NAPROSYN) 500 MG tablet TK 1 T PO TID    . ranitidine (ZANTAC) 75 MG tablet Take by mouth.    . tretinoin (RETIN-A) 0.025 % cream APP A THIN LAYER TO FACE NIGHTLY    . Vitamin D, Ergocalciferol, (DRISDOL) 1.25 MG (50000 UT) CAPS capsule Take by mouth.     No current facility-administered medications on file prior to visit.     Allergies  Allergen Reactions  . Other     general anesthesia - upsets stomach   . Penicillins Hives and Itching    Has patient had a PCN reaction causing immediate rash, facial/tongue/throat swelling, SOB or lightheadedness with hypotension: Yes Has patient had a PCN reaction causing severe rash involving mucus membranes or skin necrosis: Yes Has patient had a PCN reaction that required hospitalization: No Has patient had a PCN reaction occurring within the last 10 years: No If all of the above answers are "NO", then may proceed with Cephalosporin use.     Objective:  General: Alert and oriented x3 in no acute distress  Dermatology: No open lesions bilateral lower extremities, no webspace macerations, no ecchymosis bilateral, all nails x 10 are well manicured.  Vascular: Dorsalis Pedis and Posterior Tibial  pedal pulses palpable, Capillary Fill Time 3 seconds,(+) pedal hair growth bilateral, no edema bilateral lower extremities, Temperature gradient within normal limits.  Neurology: Johney Maine sensation intact via light touch bilateral.   Musculoskeletal: Moderate tenderness with palpation at medial plantar fascial insertion on the left, decreased pain to ball of foot especially at 3rd interspace on left>5th hammertoe on left. Strength within normal limits in all groups bilateral.    Assessment  and Plan: Problem List Items Addressed This Visit    None    Visit Diagnoses    Plantar fasciitis    -  Primary   Relevant Medications   triamcinolone acetonide (KENALOG) 10 MG/ML injection 10 mg (Completed)   triamcinolone acetonide (KENALOG) 10 MG/ML injection 10 mg (Completed) (Start on 04/01/2018 11:30 PM)   Left foot pain       Relevant Medications   triamcinolone acetonide (KENALOG) 10 MG/ML injection 10 mg (Completed) (Start on 04/01/2018 11:30 PM)   Capsulitis of foot, left       Morton neuroma, left       Hammer toe of left foot       Morbid obesity with body mass index (BMI) of 50.0 to 59.9 in adult [facility])   (Chronic)        -Complete examination performed -Discussed treatement options for 5th hammertoe and likely capsulitis vs neuroma at 3rd webspace on left -After oral consent and aseptic prep, injected a mixture containing 1 ml of 2%  plain lidocaine, 1 ml 0.5% plain marcaine, 0.5 ml of kenalog 10 and 0.5 ml of dexamethasone phosphate into left medial heel at plantar fascia without complication. Post-injection care discussed with patient.  -Advised patient to refrain from walking barefoot and to continue with CAM boot that she has for 1 more week and then may transition to tennis shoe with heel lift -Encouraged gentle stretching and icing daily  -Patient wishes to consider 5th hammertoe and possible EPF with heel spur resection surgery in the summer  -Patient to return to office as needed or sooner if condition worsens.  Landis Martins, DPM

## 2018-04-30 ENCOUNTER — Ambulatory Visit: Payer: BC Managed Care – PPO | Admitting: Family Medicine

## 2018-04-30 ENCOUNTER — Other Ambulatory Visit: Payer: Self-pay

## 2018-04-30 ENCOUNTER — Encounter: Payer: Self-pay | Admitting: Family Medicine

## 2018-04-30 DIAGNOSIS — M79605 Pain in left leg: Secondary | ICD-10-CM | POA: Diagnosis not present

## 2018-04-30 DIAGNOSIS — M1712 Unilateral primary osteoarthritis, left knee: Secondary | ICD-10-CM | POA: Diagnosis not present

## 2018-04-30 DIAGNOSIS — M7122 Synovial cyst of popliteal space [Baker], left knee: Secondary | ICD-10-CM | POA: Diagnosis not present

## 2018-04-30 NOTE — Patient Instructions (Signed)
I will call with test results I am not sure.  Three possibilities in order of likelihood 1. Early left knee arthritis aggrevated by walking the heavy boot.   2. Bruising or bursitis where the top of the boot rubbed.  Pes anserinus bursitis 3. Unlikely but serious. I want to make sure it is not a blood clot.  In the meantime, feel free to take ibuprofen.

## 2018-05-01 ENCOUNTER — Ambulatory Visit
Admission: RE | Admit: 2018-05-01 | Discharge: 2018-05-01 | Disposition: A | Discharge status: Home / Self Care | Payer: BC Managed Care – PPO | Source: Ambulatory Visit | Attending: Family Medicine | Admitting: Family Medicine

## 2018-05-01 ENCOUNTER — Encounter: Payer: Self-pay | Admitting: Family Medicine

## 2018-05-01 DIAGNOSIS — M1712 Unilateral primary osteoarthritis, left knee: Secondary | ICD-10-CM | POA: Insufficient documentation

## 2018-05-01 DIAGNOSIS — M7122 Synovial cyst of popliteal space [Baker], left knee: Secondary | ICD-10-CM | POA: Insufficient documentation

## 2018-05-01 DIAGNOSIS — M79605 Pain in left leg: Secondary | ICD-10-CM

## 2018-05-01 NOTE — Progress Notes (Signed)
Established Patient Office Visit  Subjective:  Patient ID: [name], female    DOB: 12-08-1965  Age: 53 y.o. MRN: [id number]  CC:  Chief Complaint  Patient presents with  . Leg Pain  . Knee Pain    HPI [name] presents for pain and swelling in and around the left knee.  Patient has no previous knee arthritis or DVT.  She has been having significant podiatry problems of the left foot and has been in a cam walker for weeks.  Now has pain and swelling in the left knee and below the left knee.  Also C/O fullness behind the left knee.  No previous x rays.  Denies prolonged immobility other than cam walker.  No surgery, travel, chest pain or SOB.  Past Medical History:  Diagnosis Date  . 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 Complication of anesthesia    UPSETS STOMACH  . Hyperlipidemia   . Hypertension   . IBS (irritable bowel syndrome)   . Morbid obesity ([place])   . PONV (postoperative nausea and vomiting)   . Sinus complaint   . Urticaria   . Wears glasses     Past Surgical History:  Procedure Laterality Date  . ABDOMINAL HYSTERECTOMY     COMPLETE  . CHOLECYSTECTOMY  1990  . COLONOSCOPY WITH PROPOFOL N/A 08/22/2016   Procedure: COLONOSCOPY WITH PROPOFOL;  Surgeon: Arta Silence, MD;  Location: [facility];  Service: Endoscopy;  Laterality: N/A;  . ESOPHAGEAL MANOMETRY N/A 07/06/2013   Procedure: ESOPHAGEAL MANOMETRY (EM);  Surgeon: Arta Silence, MD;  Location: [facility];  Service: Endoscopy;  Laterality: N/A;  . GASTRIC RESTRICTION SURGERY  2010   lap band  . HEMORROIDECTOMY      Family History  Problem Relation Age of Onset  . Thyroid cancer Mother   . Hypertension Mother   . Hyperlipidemia Mother   . Breast cancer Unknown        aunts  . Breast cancer Maternal Aunt   . Breast cancer Maternal Aunt   . Asthma Sister   . Allergic rhinitis Neg Hx   . Angioedema Neg Hx   . Eczema Neg Hx   . Immunodeficiency Neg Hx   . Urticaria Neg  Hx     Social History   Socioeconomic History  . Marital status: Single    Spouse name: Not on file  . Number of children: Not on file  . Years of education: Not on file  . Highest education level: Not on file  Occupational History  . Not on file  Social Needs  . Financial resource strain: Not on file  . Food insecurity:    Worry: Not on file    Inability: Not on file  . Transportation needs:    Medical: Not on file    Non-medical: Not on file  Tobacco Use  . Smoking status: Never Smoker  . Smokeless tobacco: Never Used  Substance and Sexual Activity  . Alcohol use: No  . Drug use: No  . Sexual activity: Yes    Birth control/protection: Surgical  Lifestyle  . Physical activity:    Days per week: Not on file    Minutes per session: Not on file  . Stress: Not on file  Relationships  . Social connections:    Talks on phone: Not on file    Gets together: Not on file    Attends religious service: Not on file    Active member  of club or organization: Not on file    Attends meetings of clubs or organizations: Not on file    Relationship status: Not on file  . Intimate partner violence:    Fear of current or ex partner: Not on file    Emotionally abused: Not on file    Physically abused: Not on file    Forced sexual activity: Not on file  Other Topics Concern  . Not on file  Social History Narrative  . Not on file    Outpatient Medications Prior to Visit  Medication Sig Dispense Refill  . atorvastatin (LIPITOR) 40 MG tablet Take 1 tablet (40 mg total) by mouth daily. 90 tablet 3  . azithromycin (ZITHROMAX) 250 MG tablet     . BELVIQ 10 MG TABS   1  . clindamycin (CLINDAGEL) 1 % gel APP EXT TO FACE IN THE MORNING    . EPINEPHrine (EPIPEN 2-PAK) 0.3 mg/0.3 mL IJ SOAJ injection Use as directed for severe allergic reaction. 4 Device 2  . hydrochlorothiazide (HYDRODIURIL) 25 MG tablet take 25mg  by mouth once daily 90 tablet 3  . HYDROcodone-homatropine (HYDROMET) 5-1.5  MG/5ML syrup Take 5 mLs by mouth every 6 (six) hours as needed for cough. 60 mL 0  . Lorcaserin HCl 10 MG TABS Take by mouth.    . naproxen (NAPROSYN) 500 MG tablet TK 1 T PO TID    . ranitidine (ZANTAC) 75 MG tablet Take by mouth.    . tretinoin (RETIN-A) 0.025 % cream APP A THIN LAYER TO FACE NIGHTLY    . Vitamin D, Ergocalciferol, (DRISDOL) 1.25 MG (50000 UT) CAPS capsule Take by mouth.     No facility-administered medications prior to visit.     Allergies  Allergen Reactions  . Other     general anesthesia - upsets stomach   . Penicillins Hives and Itching    Has patient had a PCN reaction causing immediate rash, facial/tongue/throat swelling, SOB or lightheadedness with hypotension: Yes Has patient had a PCN reaction causing severe rash involving mucus membranes or skin necrosis: Yes Has patient had a PCN reaction that required hospitalization: No Has patient had a PCN reaction occurring within the last 10 years: No If all of the above answers are "NO", then may proceed with Cephalosporin use.     ROS Review of Systems    Objective:    Physical Exam  BP 132/78   Pulse 72   Temp 97.7 F (36.5 C) (Oral)   Ht 5\' 5"  (1.651 m)   Wt (!) 317 lb 9.6 oz (144.1 kg)   SpO2 99%   BMI 52.85 kg/m  Wt Readings from Last 3 Encounters:  04/30/18 (!) 317 lb 9.6 oz (144.1 kg)  10/09/17 282 lb (127.9 kg)  08/30/17 296 lb 3.2 oz (134.4 kg)    Seems to have a mild joint effusion of left knee.  Pain at joint line.  Tenderness and crepitis to compression of patella.  Pain at site of pes anserinus bursa.   Health Maintenance Due  Topic Date Due  . MAMMOGRAM  11/08/2017    There are no preventive care reminders to display for this patient.  Lab Results  Component Value Date   TSH 1.168 12/14/2014   Lab Results  Component Value Date   WBC 7.0 08/30/2017   HGB 12.7 08/30/2017   HCT 37.7 08/30/2017   MCV 83 08/30/2017   PLT 322 08/30/2017   Lab Results  Component Value  Date  NA 142 08/30/2017   K 3.9 08/30/2017   CO2 26 08/30/2017   GLUCOSE 78 08/30/2017   BUN 13 08/30/2017   CREATININE 0.59 08/30/2017   BILITOT 0.7 12/14/2014   ALKPHOS 91 12/14/2014   AST 21 12/14/2014   ALT 17 12/14/2014   PROT 7.0 12/14/2014   ALBUMIN 4.2 12/14/2014   CALCIUM 9.8 08/30/2017   Lab Results  Component Value Date   CHOL 136 08/30/2017   Lab Results  Component Value Date   HDL 56 08/30/2017   Lab Results  Component Value Date   LDLCALC 67 08/30/2017   Lab Results  Component Value Date   TRIG 63 08/30/2017   Lab Results  Component Value Date   CHOLHDL 2.4 08/30/2017   Lab Results  Component Value Date   HGBA1C 5.4 08/30/2017      Assessment & Plan:   Problem List Items Addressed This Visit    Left leg pain   Relevant Orders   US Venous Img Lower Unilateral Left (Completed)   DG Knee Complete 4 Views Left (Completed)      No orders of the defined types were placed in this encounter.   Follow-up: No follow-ups on file.    Zenia Resides, MD

## 2018-05-01 NOTE — Assessment & Plan Note (Signed)
Mild by x ray at this point.

## 2018-05-01 NOTE — Assessment & Plan Note (Signed)
We discussed how weight puts her at risk for early arthritis in wt bearing joints such as foot and knee.

## 2018-05-01 NOTE — Assessment & Plan Note (Signed)
Fortunately, no evidence of DVT.

## 2018-05-01 NOTE — Assessment & Plan Note (Signed)
Seen on dopplers and cooresponds to her complaint of left post knee fullness.

## 2018-05-09 ENCOUNTER — Telehealth: Payer: Self-pay | Admitting: *Deleted

## 2018-05-09 NOTE — Telephone Encounter (Signed)
"  I am calling to discuss setting up an appointment for foot surgery.  Thank you in advance and have a good day."

## 2018-05-12 NOTE — Telephone Encounter (Signed)
"  I'm calling again to schedule my surgery."  Have you signed the consent form?  "I haven't done anything for the surgery."  You need to see Dr. Cannon Kettle for a consult appointment.  "Isn't that what I did when I was there last?"  You did not have a consult for surgery.  If you had, you would have signed your consent form and received a surgical kit.  "Okay, I didn't do that."  I'll transfer you to a scheduler so you can make an appointment.  I transferred the call to [place].

## 2018-05-20 ENCOUNTER — Ambulatory Visit: Payer: BC Managed Care – PPO | Admitting: Sports Medicine

## 2018-05-20 ENCOUNTER — Other Ambulatory Visit: Payer: Self-pay

## 2018-05-20 ENCOUNTER — Encounter: Payer: Self-pay | Admitting: Sports Medicine

## 2018-05-20 DIAGNOSIS — M722 Plantar fascial fibromatosis: Secondary | ICD-10-CM | POA: Diagnosis not present

## 2018-05-20 DIAGNOSIS — M79672 Pain in left foot: Secondary | ICD-10-CM | POA: Diagnosis not present

## 2018-05-20 DIAGNOSIS — M2042 Other hammer toe(s) (acquired), left foot: Secondary | ICD-10-CM

## 2018-05-20 NOTE — Patient Instructions (Signed)
Pre-Operative Instructions  Congratulations, you have decided to take an important step towards improving your quality of life.  You can be assured that the doctors and staff at [facility] will be with you every step of the way.  Here are some important things you should know:  1. Plan to be at the surgery center/hospital at least 1 (one) hour prior to your scheduled time, unless otherwise directed by the surgical center/hospital staff.  You must have a responsible adult accompany you, remain during the surgery and drive you home.  Make sure you have directions to the surgical center/hospital to ensure you arrive on time. 2. If you are having surgery at [facility] or [facility], you will need a copy of your medical history and physical form from your family physician within one month prior to the date of surgery. We will give you a form for your primary physician to complete.  3. We make every effort to accommodate the date you request for surgery.  However, there are times where surgery dates or times have to be moved.  We will contact you as soon as possible if a change in schedule is required.   4. No aspirin/ibuprofen for one week before surgery.  If you are on aspirin, any non-steroidal anti-inflammatory medications (Mobic, Aleve, Ibuprofen) should not be taken seven (7) days prior to your surgery.  You make take Tylenol for pain prior to surgery.  5. Medications - If you are taking daily heart and blood pressure medications, seizure, reflux, allergy, asthma, anxiety, pain or diabetes medications, make sure you notify the surgery center/hospital before the day of surgery so they can tell you which medications you should take or avoid the day of surgery. 6. No food or drink after midnight the night before surgery unless directed otherwise by surgical center/hospital staff. 7. No alcoholic beverages 24-hours prior to surgery.  No smoking 24-hours prior or 24-hours after  surgery. 8. Wear loose pants or shorts. They should be loose enough to fit over bandages, boots, and casts. 9. Don't wear slip-on shoes. Sneakers are preferred. 10. Bring your boot with you to the surgery center/hospital.  Also bring crutches or a walker if your physician has prescribed it for you.  If you do not have this equipment, it will be provided for you after surgery. 11. If you have not been contacted by the surgery center/hospital by the day before your surgery, call to confirm the date and time of your surgery. 12. Leave-time from work may vary depending on the type of surgery you have.  Appropriate arrangements should be made prior to surgery with your employer. 13. Prescriptions will be provided immediately following surgery by your doctor.  Fill these as soon as possible after surgery and take the medication as directed. Pain medications will not be refilled on weekends and must be approved by the doctor. 14. Remove nail polish on the operative foot and avoid getting pedicures prior to surgery. 15. Wash the night before surgery.  The night before surgery wash the foot and leg well with water and the antibacterial soap provided. Be sure to pay special attention to beneath the toenails and in between the toes.  Wash for at least three (3) minutes. Rinse thoroughly with water and dry well with a towel.  Perform this wash unless told not to do so by your physician.  Enclosed: 1 Ice pack (please put in freezer the night before surgery)   1 Hibiclens skin cleaner     Pre-op instructions  If you have any questions regarding the instructions, please do not hesitate to call our office.  [place]: [address], [place], [place] [address] -- [phone]  [place]: [address] [place], [place] [address] -- [phone]  [place]: [address]  [place], [place] [address] -- [phone]  [place]: [address], [address], [place], [place] [address] -- [phone]  Website:  [email or web address] 

## 2018-05-20 NOTE — Progress Notes (Signed)
Subjective: [name] is a 53 y.o. female patient who returns to office for evaluation of LEFT foot pain. Patient reports pain still with 1st few steps at her left heel and to 5th toe. Reports that she also has been having knee pain and will need knee surgery as well but her ortho doctor said she should get her foot fixed 1st. Patient report that nothing has helped this far. Has used boot but heel still hurts. Denies changes with health status or meds since last visit.    Patient Active Problem List   Diagnosis Date Noted  . Osteoarthritis of left knee 05/01/2018  . Baker's cyst of knee, left 05/01/2018  . Left leg pain 04/30/2018  . Vitamin D deficiency 08/30/2017  . HLD (hyperlipidemia) 06/26/2017  . Pre-diabetes 04/23/2017  . Other allergic rhinitis 12/12/2016  . 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 Toxic effect of venom of ants, unintentional 12/12/2016  . Exercise-induced bronchospasm 12/12/2016  . Acid reflux 04/18/2015  . 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 Anxiety 11/24/2010  . NEOPLASM, MALIGNANT, OVARY, HX OF 12/05/2009  . CONSTIPATION, CHRONIC 09/06/2009  . POSTMENOPAUSAL ATROPHIC VAGINITIS 05/24/2009  . EXTERNAL HEMORRHOIDS 10/08/2007  . DIVERTICULOSIS, COLON 10/08/2007  . Morbid obesity due to excess calories ([place]) 05/02/2006  . HYPERTENSION, BENIGN SYSTEMIC 05/02/2006    Current Outpatient Medications on File Prior to Visit  Medication Sig Dispense Refill  . hydrochlorothiazide (HYDRODIURIL) 25 MG tablet take 25mg  by mouth once daily 90 tablet 3   No current facility-administered medications on file prior to visit.     Allergies  Allergen Reactions  . Other     general anesthesia - upsets stomach   . Penicillins Hives and Itching    Has patient had a PCN reaction causing immediate rash, facial/tongue/throat swelling, SOB or lightheadedness  with hypotension: Yes Has patient had a PCN reaction causing severe rash involving mucus membranes or skin necrosis: Yes Has patient had a PCN reaction that required hospitalization: No Has patient had a PCN reaction occurring within the last 10 years: No If all of the above answers are "NO", then may proceed with Cephalosporin use.    Family History  Problem Relation Age of Onset  . Thyroid cancer Mother   . Hypertension Mother   . Hyperlipidemia Mother   . Breast cancer Other        aunts  . Breast cancer Maternal Aunt   . Breast cancer Maternal Aunt   . Asthma Sister   . Allergic rhinitis Neg Hx   . Angioedema Neg Hx   . Eczema Neg Hx   . Immunodeficiency Neg Hx   . Urticaria Neg Hx    Social History   Socioeconomic History  . Marital status: Single    Spouse name: Not on file  . Number of children: Not on file  . Years of education: Not on file  . Highest education level: Not on file  Occupational History  . Not on file  Social Needs  . Financial resource strain: Not on file  . Food insecurity:    Worry: Not on file    Inability: Not on file  . Transportation needs:    Medical: Not on file    Non-medical: Not on file  Tobacco Use  . Smoking status: Never Smoker  . Smokeless tobacco: Never Used  Substance and Sexual Activity  . Alcohol use: No  .  Drug use: No  . Sexual activity: Yes    Birth control/protection: Surgical  Lifestyle  . Physical activity:    Days per week: Not on file    Minutes per session: Not on file  . Stress: Not on file  Relationships  . Social connections:    Talks on phone: Not on file    Gets together: Not on file    Attends religious service: Not on file    Active member of club or organization: Not on file    Attends meetings of clubs or organizations: Not on file    Relationship status: Not on file  Other Topics Concern  . Not on file  Social History Narrative  . Not on file   Past Surgical History:  Procedure Laterality  Date  . ABDOMINAL HYSTERECTOMY     COMPLETE  . CHOLECYSTECTOMY  1990  . COLONOSCOPY WITH PROPOFOL N/A 08/22/2016   Procedure: COLONOSCOPY WITH PROPOFOL;  Surgeon: Arta Silence, MD;  Location: [facility];  Service: Endoscopy;  Laterality: N/A;  . ESOPHAGEAL MANOMETRY N/A 07/06/2013   Procedure: ESOPHAGEAL MANOMETRY (EM);  Surgeon: Arta Silence, MD;  Location: [facility];  Service: Endoscopy;  Laterality: N/A;  . GASTRIC RESTRICTION SURGERY  2010   lap band  . HEMORROIDECTOMY      Objective:  General: Alert and oriented x3 in no acute distress  Dermatology: No open lesions bilateral lower extremities, no webspace macerations, no ecchymosis bilateral, all nails x 10 are well manicured.  Vascular: Dorsalis Pedis and Posterior Tibial pedal pulses palpable, Capillary Fill Time 3 seconds,(+) pedal hair growth bilateral, no edema bilateral lower extremities, Temperature gradient within normal limits.  Neurology: Johney Maine sensation intact via light touch bilateral.   Musculoskeletal: Moderate tenderness with palpation at medial plantar fascial insertion on the left, mild pain 5th hammertoe on left with varus rotation of toe. Strength within normal limits in all groups bilateral.    Assessment and Plan: Problem List Items Addressed This Visit    None    Visit Diagnoses    Plantar fasciitis    -  Primary   Hammer toe of left foot       Left foot pain          -Complete examination performed -Discussed treatement options for 5th hammertoe and plantar fasciitis  -Patient opt for surgical management. Consent obtained for left 5th hammertoePre and Post op course explained. Risks, benefits, alternatives explained. No guarantees given or implied. Surgical booking slip submitted and provided patient with Surgical packet and info for [place]. -Dispensed surgical shoe to use post op -Patient is aware may need FMLA for 8 weeks -Patient to return to office after surgery or sooner if condition  worsens.  Landis Martins, DPM

## 2018-06-11 ENCOUNTER — Telehealth: Payer: Self-pay | Admitting: *Deleted

## 2018-06-11 NOTE — Telephone Encounter (Signed)
I am calling to reschedule your surgery.  We are only allowed to do emergent cases at this time at the surgery center.  "Okay, that's fine.  What day are we rescheduling it to?"  Dr. Cannon Kettle can do it on August 04, 2018.  Is that date okay?  "Yes, that will be fine.  What time will it be?"  Someone from the surgical center will call you the Friday prior to your surgical date and they will give you your arrival time.  I rescheduled the surgery from 06/23/2018 to 08/04/2018 via the surgical center's [employer] Portal.

## 2018-07-08 ENCOUNTER — Other Ambulatory Visit: Payer: Self-pay

## 2018-07-10 MED ORDER — HYDROCHLOROTHIAZIDE 25 MG PO TABS: ORAL_TABLET | ORAL | 3 refills | Status: DC

## 2018-07-24 ENCOUNTER — Encounter: Payer: Self-pay | Admitting: Sports Medicine

## 2018-07-25 ENCOUNTER — Telehealth: Payer: Self-pay | Admitting: *Deleted

## 2018-07-25 NOTE — Telephone Encounter (Signed)
"  I'm going to have to reschedule my surgery that's scheduled for 06/01.  Her email said I did not need to take time off but then she said she recommends I take off for about a week.  I'm a little confused.  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  The last mandatory workday is the eighth.  So there is no way to do what she's asking me.  If she feels like I'm not going to be coherent based on the medicine and the pain, then I'm going to have to cancel that surgery and reschedule to a later date.  Call me back.  I will wait on your call, if I don't hear from you, I'll call the surgery center and cancel myself.  I look forward to hearing from you."  I am returning your call.  "I'm confused is it okay for me to have the surgery or should I take some time off?"  She's saying there's a possibility that you may need pain medication.  She does not know what your pain level may be.  She does not know how your body may react to the pain medicine that she will prescribe for you.  It could make you sleepy or drowsy.  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  So, I better schedule it for June 15.  I have some stuff I want to do in my yard before I have this surgery so I won't have to pay anyone else to do it."  I'll reschedule it to August 18, 2018.  I rescheduled her surgery from 08/04/2018 to 08/18/2018 via the surgical center's [employer] Portal.

## 2018-08-12 ENCOUNTER — Telehealth: Payer: Self-pay | Admitting: *Deleted

## 2018-08-12 NOTE — Telephone Encounter (Signed)
DOS 08/18/2018, CPT CODES: 95093 - HEEL SPUR RESECTION, 28285 - HAMMER TOE REPAIR 5TH, AND 26712 - EPF OF THE LEFT FOOT  BCBS: Policy Effective : 45/80/9983 - 03/04/9998  Member Liability Summary      In-Network    Max Per Benefit Period Year-to-Date Remaining  CoInsurance  20%   Deductible  $1250.00 $1250.00  Out-Of-Pocket  $4890.00 $4701.50   AMBULATORY SURGERY  In Network  Copay Coinsurance  Not Applicable  38% per [place]

## 2018-08-18 ENCOUNTER — Other Ambulatory Visit: Payer: Self-pay | Admitting: Sports Medicine

## 2018-08-18 ENCOUNTER — Telehealth: Payer: Self-pay | Admitting: *Deleted

## 2018-08-18 ENCOUNTER — Encounter: Payer: Self-pay | Admitting: Sports Medicine

## 2018-08-18 DIAGNOSIS — Z9889 Other specified postprocedural states: Secondary | ICD-10-CM

## 2018-08-18 DIAGNOSIS — M722 Plantar fascial fibromatosis: Secondary | ICD-10-CM | POA: Diagnosis not present

## 2018-08-18 DIAGNOSIS — M2042 Other hammer toe(s) (acquired), left foot: Secondary | ICD-10-CM | POA: Diagnosis not present

## 2018-08-18 DIAGNOSIS — M7732 Calcaneal spur, left foot: Secondary | ICD-10-CM | POA: Diagnosis not present

## 2018-08-18 MED ORDER — DOCUSATE SODIUM 100 MG PO CAPS: 100.0000 mg | ORAL_CAPSULE | Freq: Two times a day (BID) | ORAL | 0 refills | Status: DC

## 2018-08-18 MED ORDER — HYDROCODONE-ACETAMINOPHEN 10-325 MG PO TABS: 1.0000 | ORAL_TABLET | Freq: Four times a day (QID) | ORAL | 0 refills | Status: AC | PRN

## 2018-08-18 MED ORDER — PROMETHAZINE HCL 25 MG PO TABS: 25.0000 mg | ORAL_TABLET | Freq: Three times a day (TID) | ORAL | 0 refills | Status: DC | PRN

## 2018-08-18 NOTE — Progress Notes (Signed)
Post op meds entered  -Dr. Arti Trang 

## 2018-08-18 NOTE — Telephone Encounter (Signed)
Pt states she had blood in her boot when she got up to go to the bathroom. I asked pt if it was the size of a dime or quarter. Pt states it is just red and she cleaned it out. I offered for pt to come to the office to have the dressing reinforced or she could mark the edges of the bleeding and call again. Pt states she will call to have someone bring her to the office before 5:00pm

## 2018-08-19 ENCOUNTER — Telehealth: Payer: Self-pay | Admitting: Sports Medicine

## 2018-08-19 NOTE — Telephone Encounter (Signed)
Post op check phone call made to patient. Patient reports that she had some bleeding on her dressing and came in on yesterday and had the dressing re-inforced. Patient denies any currently bleeding and reports that she has been trying to stay off her foot and has been icing and elevating, Patient reports that her leg is still numb but has been taking Motrin PRN. Patient denies any other symptoms or pedal problems. I educated patient on how it feels to have a nerve block and the symptoms to look for when the block wears off. I advised patient to call office if there are any other concerns. Patient to follow up next week as scheduled. -Dr. Cannon Kettle

## 2018-08-24 ENCOUNTER — Encounter: Payer: Self-pay | Admitting: Sports Medicine

## 2018-08-26 ENCOUNTER — Ambulatory Visit (INDEPENDENT_AMBULATORY_CARE_PROVIDER_SITE_OTHER): Payer: BC Managed Care – PPO | Admitting: Sports Medicine

## 2018-08-26 ENCOUNTER — Encounter: Payer: Self-pay | Admitting: Sports Medicine

## 2018-08-26 ENCOUNTER — Ambulatory Visit (INDEPENDENT_AMBULATORY_CARE_PROVIDER_SITE_OTHER): Payer: BC Managed Care – PPO

## 2018-08-26 ENCOUNTER — Other Ambulatory Visit: Payer: Self-pay

## 2018-08-26 VITALS — Temp 96.7°F

## 2018-08-26 DIAGNOSIS — M722 Plantar fascial fibromatosis: Secondary | ICD-10-CM

## 2018-08-26 DIAGNOSIS — M2042 Other hammer toe(s) (acquired), left foot: Secondary | ICD-10-CM | POA: Diagnosis not present

## 2018-08-26 DIAGNOSIS — Z9889 Other specified postprocedural states: Secondary | ICD-10-CM

## 2018-08-26 NOTE — Progress Notes (Signed)
Subjective: [name] is a 53 y.o. female patient seen today in office for POV #1 (DOS 08-18-18), S/P Left 5th hammertoe repair and EPF w/ Heel spur reduction. Patient denies pain at surgical site takes motrin when felt pulling sensation from bandage, denies calf pain, denies headache, chest pain, shortness of breath, nausea, vomiting, fever, or chills. No other issues noted.   Patient Active Problem List   Diagnosis Date Noted  . Osteoarthritis of left knee 05/01/2018  . Baker's cyst of knee, left 05/01/2018  . Left leg pain 04/30/2018  . Vitamin D deficiency 08/30/2017  . HLD (hyperlipidemia) 06/26/2017  . Pre-diabetes 04/23/2017  . Other allergic rhinitis 12/12/2016  . 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 Toxic effect of venom of ants, unintentional 12/12/2016  . Exercise-induced bronchospasm 12/12/2016  . Acid reflux 04/18/2015  . 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 Anxiety 11/24/2010  . NEOPLASM, MALIGNANT, OVARY, HX OF 12/05/2009  . CONSTIPATION, CHRONIC 09/06/2009  . POSTMENOPAUSAL ATROPHIC VAGINITIS 05/24/2009  . EXTERNAL HEMORRHOIDS 10/08/2007  . DIVERTICULOSIS, COLON 10/08/2007  . Morbid obesity due to excess calories ([place]) 05/02/2006  . HYPERTENSION, BENIGN SYSTEMIC 05/02/2006    Current Outpatient Medications on File Prior to Visit  Medication Sig Dispense Refill  . docusate sodium (COLACE) 100 MG capsule Take 1 capsule (100 mg total) by mouth 2 (two) times daily. 10 capsule 0  . hydrochlorothiazide (HYDRODIURIL) 25 MG tablet take 25mg  by mouth once daily 90 tablet 3  . promethazine (PHENERGAN) 25 MG tablet Take 1 tablet (25 mg total) by mouth every 8 (eight) hours as needed for nausea or vomiting. 20 tablet 0   No current facility-administered medications on file prior to visit.     Allergies  Allergen Reactions  . Other     general anesthesia  - upsets stomach   . Penicillins Hives and Itching    Has patient had a PCN reaction causing immediate rash, facial/tongue/throat swelling, SOB or lightheadedness with hypotension: Yes Has patient had a PCN reaction causing severe rash involving mucus membranes or skin necrosis: Yes Has patient had a PCN reaction that required hospitalization: No Has patient had a PCN reaction occurring within the last 10 years: No If all of the above answers are "NO", then may proceed with Cephalosporin use.     Objective: There were no vitals filed for this visit.  General: No acute distress, AAOx3  Left foot: Sutures intact with no gapping or dehiscence at surgical site, mild swelling to left, no erythema, no warmth, no drainage, no signs of infection noted, Capillary fill time <3 seconds in all digits, gross sensation present via light touch to left foot. No pain or crepitation with range of motion left foot.  No pain with calf compression.   Post Op Xray, Left foot: Consistent with post op status. Soft tissue swelling within normal limits for post op status.   Assessment and Plan:  Problem List Items Addressed This Visit    None    Visit Diagnoses    S/P foot surgery, left    -  Primary   Relevant Orders   DG Foot Complete Left (Completed)   Plantar fasciitis       Relevant Orders   DG Foot Complete Left (Completed)   Hammer toe of left foot       Relevant Orders   DG Foot Complete Left (Completed)       -  Patient seen and evaluated -Xrays reviewed -Applied dry sterile dressing to surgical site left foot secured with ACE wrap and stockinet  -Advised patient to make sure to keep dressings clean, dry, and intact to left surgical site -Advised patient to continue with CAM boot did dispense post op shoe to use for short distances -Advised patient to limit activity to necessity with cane -Advised patient to ice and elevate as necessary  -Will plan for suture removal at next office visit. In  the meantime, patient to call office if any issues or problems arise.   Landis Martins, DPM

## 2018-09-02 ENCOUNTER — Other Ambulatory Visit: Payer: Self-pay

## 2018-09-02 ENCOUNTER — Ambulatory Visit (INDEPENDENT_AMBULATORY_CARE_PROVIDER_SITE_OTHER): Payer: Self-pay | Admitting: Sports Medicine

## 2018-09-02 VITALS — Temp 97.9°F

## 2018-09-02 DIAGNOSIS — M722 Plantar fascial fibromatosis: Secondary | ICD-10-CM

## 2018-09-02 DIAGNOSIS — Z9889 Other specified postprocedural states: Secondary | ICD-10-CM

## 2018-09-02 DIAGNOSIS — M2042 Other hammer toe(s) (acquired), left foot: Secondary | ICD-10-CM

## 2018-09-02 DIAGNOSIS — M79672 Pain in left foot: Secondary | ICD-10-CM

## 2018-09-02 NOTE — Progress Notes (Signed)
Subjective: [name] is a 53 y.o. female patient seen today in office for POV #2 (DOS 08-18-18), S/P Left 5th hammertoe repair and EPF w/ Heel spur reduction. Patient denies pain at surgical site, reports that she is feeling good, denies calf pain, denies headache, chest pain, shortness of breath, nausea, vomiting, fever, or chills. No other issues noted.   Patient Active Problem List   Diagnosis Date Noted  . Osteoarthritis of left knee 05/01/2018  . Baker's cyst of knee, left 05/01/2018  . Left leg pain 04/30/2018  . Vitamin D deficiency 08/30/2017  . HLD (hyperlipidemia) 06/26/2017  . Pre-diabetes 04/23/2017  . Other allergic rhinitis 12/12/2016  . 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 Toxic effect of venom of ants, unintentional 12/12/2016  . Exercise-induced bronchospasm 12/12/2016  . Acid reflux 04/18/2015  . 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 Anxiety 11/24/2010  . NEOPLASM, MALIGNANT, OVARY, HX OF 12/05/2009  . CONSTIPATION, CHRONIC 09/06/2009  . POSTMENOPAUSAL ATROPHIC VAGINITIS 05/24/2009  . EXTERNAL HEMORRHOIDS 10/08/2007  . DIVERTICULOSIS, COLON 10/08/2007  . Morbid obesity due to excess calories ([place]) 05/02/2006  . HYPERTENSION, BENIGN SYSTEMIC 05/02/2006    Current Outpatient Medications on File Prior to Visit  Medication Sig Dispense Refill  . docusate sodium (COLACE) 100 MG capsule Take 1 capsule (100 mg total) by mouth 2 (two) times daily. 10 capsule 0  . hydrochlorothiazide (HYDRODIURIL) 25 MG tablet take 25mg  by mouth once daily 90 tablet 3  . promethazine (PHENERGAN) 25 MG tablet Take 1 tablet (25 mg total) by mouth every 8 (eight) hours as needed for nausea or vomiting. 20 tablet 0   No current facility-administered medications on file prior to visit.     Allergies  Allergen Reactions  . Other     general anesthesia - upsets stomach    . Penicillins Hives and Itching    Has patient had a PCN reaction causing immediate rash, facial/tongue/throat swelling, SOB or lightheadedness with hypotension: Yes Has patient had a PCN reaction causing severe rash involving mucus membranes or skin necrosis: Yes Has patient had a PCN reaction that required hospitalization: No Has patient had a PCN reaction occurring within the last 10 years: No If all of the above answers are "NO", then may proceed with Cephalosporin use.     Objective: There were no vitals filed for this visit.  General: No acute distress, AAOx3  Left foot: Sutures intact with no gapping or dehiscence at surgical site, mild swelling to left, no erythema, no warmth, no drainage, no signs of infection noted, Capillary fill time <3 seconds in all digits, gross sensation present via light touch to left foot. No pain or crepitation with range of motion left foot.  No pain with calf compression.   Assessment and Plan:  Problem List Items Addressed This Visit    None    Visit Diagnoses    S/P foot surgery, left    -  Primary   Plantar fasciitis       Hammer toe of left foot       Left foot pain           -Patient seen and evaluated -Sutures removed -Applied dry sterile dressing to surgical site left foot secured with ACE wrap and stockinet; may remove tomorrow for shower -Advised patient may use post op shoe full time -Advised patient to ice and elevate as necessary  -Will  plan for xray and transition out of post op shoe at next office visit in 2 weeks. In the meantime, patient to call office if any issues or problems arise.   Landis Martins, DPM

## 2018-09-16 ENCOUNTER — Ambulatory Visit (INDEPENDENT_AMBULATORY_CARE_PROVIDER_SITE_OTHER): Payer: BC Managed Care – PPO

## 2018-09-16 ENCOUNTER — Other Ambulatory Visit: Payer: Self-pay

## 2018-09-16 ENCOUNTER — Encounter: Payer: Self-pay | Admitting: Sports Medicine

## 2018-09-16 ENCOUNTER — Ambulatory Visit (INDEPENDENT_AMBULATORY_CARE_PROVIDER_SITE_OTHER): Payer: BC Managed Care – PPO | Admitting: Sports Medicine

## 2018-09-16 DIAGNOSIS — Z9889 Other specified postprocedural states: Secondary | ICD-10-CM | POA: Diagnosis not present

## 2018-09-16 DIAGNOSIS — M2042 Other hammer toe(s) (acquired), left foot: Secondary | ICD-10-CM

## 2018-09-16 DIAGNOSIS — M79672 Pain in left foot: Secondary | ICD-10-CM | POA: Diagnosis not present

## 2018-09-16 DIAGNOSIS — M722 Plantar fascial fibromatosis: Secondary | ICD-10-CM

## 2018-09-16 DIAGNOSIS — Z6841 Body Mass Index (BMI) 40.0 and over, adult: Secondary | ICD-10-CM

## 2018-09-16 NOTE — Progress Notes (Signed)
Subjective: [name] is a 53 y.o. female patient seen today in office for POV #3 (DOS 08-18-18), S/P Left 5th hammertoe repair and EPF w/ Heel spur reduction. Patient denies pain at surgical site, reports that she is feeling good with some swelling to baby toe, denies calf pain, denies headache, chest pain, shortness of breath, nausea, vomiting, fever, or chills. No other issues noted.   Patient Active Problem List   Diagnosis Date Noted  . Osteoarthritis of left knee 05/01/2018  . Baker's cyst of knee, left 05/01/2018  . Left leg pain 04/30/2018  . Vitamin D deficiency 08/30/2017  . HLD (hyperlipidemia) 06/26/2017  . Pre-diabetes 04/23/2017  . Other allergic rhinitis 12/12/2016  . 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 Toxic effect of venom of ants, unintentional 12/12/2016  . Exercise-induced bronchospasm 12/12/2016  . Acid reflux 04/18/2015  . 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 Anxiety 11/24/2010  . NEOPLASM, MALIGNANT, OVARY, HX OF 12/05/2009  . CONSTIPATION, CHRONIC 09/06/2009  . POSTMENOPAUSAL ATROPHIC VAGINITIS 05/24/2009  . EXTERNAL HEMORRHOIDS 10/08/2007  . DIVERTICULOSIS, COLON 10/08/2007  . Morbid obesity due to excess calories ([place]) 05/02/2006  . HYPERTENSION, BENIGN SYSTEMIC 05/02/2006    Current Outpatient Medications on File Prior to Visit  Medication Sig Dispense Refill  . docusate sodium (COLACE) 100 MG capsule Take 1 capsule (100 mg total) by mouth 2 (two) times daily. 10 capsule 0  . hydrochlorothiazide (HYDRODIURIL) 25 MG tablet take 25mg  by mouth once daily 90 tablet 3  . promethazine (PHENERGAN) 25 MG tablet Take 1 tablet (25 mg total) by mouth every 8 (eight) hours as needed for nausea or vomiting. 20 tablet 0   No current facility-administered medications on file prior to visit.     Allergies  Allergen Reactions  . Other     general  anesthesia - upsets stomach   . Penicillins Hives and Itching    Has patient had a PCN reaction causing immediate rash, facial/tongue/throat swelling, SOB or lightheadedness with hypotension: Yes Has patient had a PCN reaction causing severe rash involving mucus membranes or skin necrosis: Yes Has patient had a PCN reaction that required hospitalization: No Has patient had a PCN reaction occurring within the last 10 years: No If all of the above answers are "NO", then may proceed with Cephalosporin use.     Objective: There were no vitals filed for this visit.  General: No acute distress, AAOx3  Left foot: Incisions healing well at surgical sites, mild swelling to left, no erythema, no warmth, no drainage, no signs of infection noted, Capillary fill time <3 seconds in all digits, gross sensation present via light touch to left foot. No pain or crepitation with range of motion left foot.  No pain with calf compression.   Xrays No acute findings, consistent with post op status   Assessment and Plan:  Problem List Items Addressed This Visit    None    Visit Diagnoses    S/P foot surgery, left    -  Primary   Relevant Orders   DG Foot Complete Left   Plantar fasciitis       Hammer toe of left foot       Left foot pain       Morbid obesity with body mass index (BMI) of 50.0 to 59.9 in adult [facility])           -Patient seen  and evaluated -Advised patient to try normal shoes and may increase activities to tolerance -Dispensed 5th toe cap and compression anklet  -Will plan for final post op check at next office visit in 2 weeks. In the meantime, patient to call office if any issues or problems arise.   Landis Martins, DPM

## 2018-10-03 ENCOUNTER — Telehealth: Payer: Self-pay | Admitting: Family Medicine

## 2018-10-03 ENCOUNTER — Other Ambulatory Visit: Payer: Self-pay

## 2018-10-03 ENCOUNTER — Ambulatory Visit
Admission: RE | Admit: 2018-10-03 | Discharge: 2018-10-03 | Disposition: A | Discharge status: Home / Self Care | Payer: BC Managed Care – PPO | Source: Ambulatory Visit | Attending: Family Medicine | Admitting: Family Medicine

## 2018-10-03 DIAGNOSIS — Z1231 Encounter for screening mammogram for malignant neoplasm of breast: Secondary | ICD-10-CM

## 2018-10-03 NOTE — Telephone Encounter (Signed)
Pt requesting referral for orthopedic. She has a bakers cyst on the back of her leg. Please call pt once this has been placed.

## 2018-10-06 ENCOUNTER — Other Ambulatory Visit: Payer: Self-pay | Admitting: Family Medicine

## 2018-10-06 DIAGNOSIS — M7120 Synovial cyst of popliteal space [Baker], unspecified knee: Secondary | ICD-10-CM

## 2018-10-06 NOTE — Telephone Encounter (Signed)
Referral sent to Sports Med.  Please let patient know.

## 2018-10-06 NOTE — Progress Notes (Signed)
Referral sent to sports med for drainage of baker's cyst at patient request.

## 2018-10-06 NOTE — Telephone Encounter (Signed)
Pt informed.[name], CMA ° °

## 2018-10-08 ENCOUNTER — Ambulatory Visit: Payer: BC Managed Care – PPO | Admitting: Family Medicine

## 2018-10-08 ENCOUNTER — Encounter: Payer: Self-pay | Admitting: Family Medicine

## 2018-10-08 ENCOUNTER — Other Ambulatory Visit: Payer: Self-pay

## 2018-10-08 ENCOUNTER — Ambulatory Visit: Payer: Self-pay

## 2018-10-08 VITALS — BP 146/96 | Ht 65.0 in | Wt 315.0 lb

## 2018-10-08 DIAGNOSIS — M25562 Pain in left knee: Secondary | ICD-10-CM

## 2018-10-08 DIAGNOSIS — M7122 Synovial cyst of popliteal space [Baker], left knee: Secondary | ICD-10-CM | POA: Diagnosis not present

## 2018-10-08 MED ORDER — METHYLPREDNISOLONE ACETATE 40 MG/ML IJ SUSP
40.0000 mg | Freq: Once | INTRAMUSCULAR | Status: AC
  Administered 2018-10-08: 40 mg via INTRA_ARTICULAR

## 2018-10-08 NOTE — Progress Notes (Signed)
PCP and consultation requested by: Cleophas Dunker, DO  Subjective:   HPI: Patient is a 53 y.o. female here for evaluation of left posterior knee pain and swelling.  Patient was referred here for an ultrasound guided aspiration of Baker's cyst on her left knee.  She notes the cyst has been present since March and is grown in size since then.  She notes her knee has become increasingly painful over the last several months and is interfered with walking and going up and down stairs.  Patient notes the pain is located posteriorly and does not radiate.  She denies any overlying skin changes.  Patient has no numbness or tingling in her legs.  Patient denies any hip pain or ankle pain.  Patient denies any injury or trauma to her knee in the past.  Review of Systems: See HPI above.  Past Medical History:  Diagnosis Date  . Cancer (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 Complication of anesthesia    UPSETS STOMACH  . Hyperlipidemia   . Hypertension   . IBS (irritable bowel syndrome)   . Morbid obesity ([place])   . PONV (postoperative nausea and vomiting)   . Sinus complaint   . Urticaria   . Wears glasses     Current Outpatient Medications on File Prior to Visit  Medication Sig Dispense Refill  . clindamycin (CLINDAGEL) 1 % gel APP EXT TO FACE IN THE MORNING    . docusate sodium (COLACE) 100 MG capsule Take 1 capsule (100 mg total) by mouth 2 (two) times daily. 10 capsule 0  . hydrochlorothiazide (HYDRODIURIL) 25 MG tablet take 25mg  by mouth once daily 90 tablet 3  . promethazine (PHENERGAN) 25 MG tablet Take 1 tablet (25 mg total) by mouth every 8 (eight) hours as needed for nausea or vomiting. 20 tablet 0  . tretinoin (RETIN-A) 0.025 % cream APP A THIN LAYER TO FACE NIGHTLY     No current facility-administered medications on file prior to visit.     Past Surgical History:  Procedure Laterality Date  . ABDOMINAL HYSTERECTOMY     COMPLETE  . CHOLECYSTECTOMY  1990  . COLONOSCOPY  WITH PROPOFOL N/A 08/22/2016   Procedure: COLONOSCOPY WITH PROPOFOL;  Surgeon: Arta Silence, MD;  Location: [facility];  Service: Endoscopy;  Laterality: N/A;  . ESOPHAGEAL MANOMETRY N/A 07/06/2013   Procedure: ESOPHAGEAL MANOMETRY (EM);  Surgeon: Arta Silence, MD;  Location: [facility];  Service: Endoscopy;  Laterality: N/A;  . GASTRIC RESTRICTION SURGERY  2010   lap band  . HEMORROIDECTOMY      Allergies  Allergen Reactions  . Other     general anesthesia - upsets stomach   . Penicillins Hives and Itching    Has patient had a PCN reaction causing immediate rash, facial/tongue/throat swelling, SOB or lightheadedness with hypotension: Yes Has patient had a PCN reaction causing severe rash involving mucus membranes or skin necrosis: Yes Has patient had a PCN reaction that required hospitalization: No Has patient had a PCN reaction occurring within the last 10 years: No If all of the above answers are "NO", then may proceed with Cephalosporin use.     Social History   Socioeconomic History  . Marital status: Single    Spouse name: Not on file  . Number of children: Not on file  . Years of education: Not on file  . Highest education level: Not on file  Occupational History  . Not on file  Social Needs  .  Financial resource strain: Not on file  . Food insecurity    Worry: Not on file    Inability: Not on file  . Transportation needs    Medical: Not on file    Non-medical: Not on file  Tobacco Use  . Smoking status: Never Smoker  . Smokeless tobacco: Never Used  Substance and Sexual Activity  . Alcohol use: No  . Drug use: No  . Sexual activity: Yes    Birth control/protection: Surgical  Lifestyle  . Physical activity    Days per week: Not on file    Minutes per session: Not on file  . Stress: Not on file  Relationships  . Social Herbalist on phone: Not on file    Gets together: Not on file    Attends religious service: Not on file    Active  member of club or organization: Not on file    Attends meetings of clubs or organizations: Not on file    Relationship status: Not on file  . Intimate partner violence    Fear of current or ex partner: Not on file    Emotionally abused: Not on file    Physically abused: Not on file    Forced sexual activity: Not on file  Other Topics Concern  . Not on file  Social History Narrative  . Not on file    Family History  Problem Relation Age of Onset  . Thyroid cancer Mother   . Hypertension Mother   . Hyperlipidemia Mother   . Breast cancer Other        aunts  . Breast cancer Maternal Aunt   . Breast cancer Maternal Aunt   . Asthma Sister   . Allergic rhinitis Neg Hx   . Angioedema Neg Hx   . Eczema Neg Hx   . Immunodeficiency Neg Hx   . Urticaria Neg Hx         Objective:  Physical Exam: BP (!) 146/96   Ht 5\' 5"  (1.651 m)   Wt (!) 315 lb (142.9 kg)   BMI 52.42 kg/m  Gen: NAD, comfortable in exam room Lungs: Breathing comfortably on room air  Knee Exam Left -Inspection: no deformity, no discoloration, posterior swelling noted -Palpation: Tenderness palpation along the posterior medial aspect of the knee.  There is fullness noted in this area on palpation in comparison to contralateral side -ROM: Extension: 0 degrees; Flexion: 150 degrees -Strength: Extension: 5/5; Flexion: 5/5 -Special Tests: Varus Stress: Negative; Valgus Stress: Negative; Lachman: Negative; Posterior drawer: Negative; McMurray: Negative -Limb neurovascularly intact  Contralateral Knee -Inspection: no deformity, no discoloration -Palpation: medial joint line: Non-tender; lateral joint line: non-tender; quad tendon: non-tender; patella: non-tender; patellar tendon: non-tendon -ROM: Extension: 0 degrees; Flexion: 150 degrees -Strength: Extension: 5/5; Flexion: 5/5 -Limb neurovascularly intact   Limited ultrasound examination of left knee Findings: - Suprapatellar pouch: There is a small effusion  noted in the suprapatellar pouch.  There is normal appearance of the patella.  Normal appearance of the quad tendon and long axis. - Popliteal fossa: There is a 1 x 4 cm fluid collection on the medial aspect of the popliteal fossa present between the semimembranosus and head of the gastrocnemius Impression: - Ultrasound findings consistent with Baker's cyst of the left knee and associated small knee effusion   Assessment & Plan:  Patient is a 53 y.o. female here for evaluation of left knee pain and fullness 1.  Baker's cyst - Consent was obtained for  ultrasound-guided Baker's cyst aspiration.  Timeout was performed.  Patient was advised of the risks of the procedure as well as the risk for recurrence however patient wanted the cyst drained due to the pain it was causing - The overlying skin was cleaned with alcohol wipes.  3 cc of lidocaine was injected into the overlying soft tissue above the Baker's cyst.  An 18-gauge needle was used to drain the cyst under ultrasound guidance.  5 cc of nonbloody, straw-colored aspirate was drained.  40mg  of Kenalog and 3 cc of lidocaine were injected into the Baker's cyst following the aspiration.  Patient tolerated the procedure well. - Patient was given an Ace compression wrap to wear for the next several days - Patient instructed to take Tylenol as needed for discomfort as well as elevate the leg tonight and ice as needed for pain  Follow-up as needed

## 2018-10-08 NOTE — Patient Instructions (Signed)
Your knee swelling is caused by a bakers cyst. Today the cyst was drained and a steroid was injected into your knee.  -Tonight you may ice your knee as needed for soreness -We will give you an ACE-wrap to provide compression. You may wear this for the next 3-4 days to help prevent recurrence -You should keep your leg elevated tonight  You can follow up on an as needed basis for recurrence of knee swelling and pain

## 2018-10-21 ENCOUNTER — Ambulatory Visit (INDEPENDENT_AMBULATORY_CARE_PROVIDER_SITE_OTHER): Payer: Self-pay | Admitting: Sports Medicine

## 2018-10-21 ENCOUNTER — Encounter: Payer: Self-pay | Admitting: Sports Medicine

## 2018-10-21 ENCOUNTER — Other Ambulatory Visit: Payer: Self-pay

## 2018-10-21 VITALS — Temp 97.2°F

## 2018-10-21 DIAGNOSIS — M79672 Pain in left foot: Secondary | ICD-10-CM

## 2018-10-21 DIAGNOSIS — M722 Plantar fascial fibromatosis: Secondary | ICD-10-CM

## 2018-10-21 DIAGNOSIS — Z9889 Other specified postprocedural states: Secondary | ICD-10-CM

## 2018-10-21 DIAGNOSIS — M2042 Other hammer toe(s) (acquired), left foot: Secondary | ICD-10-CM

## 2018-10-21 NOTE — Progress Notes (Signed)
Subjective: [name] is a 53 y.o. female patient seen today in office for POV #4 (DOS 08-18-18), S/P Left 5th hammertoe repair and EPF w/ Heel spur reduction. Patient admits a little pain at the surgical site, reports that she is feeling good with some swelling to baby toe still and some aching pain, denies calf pain, denies headache, chest pain, shortness of breath, nausea, vomiting, fever, or chills. No other issues noted.   Patient Active Problem List   Diagnosis Date Noted  . Osteoarthritis of left knee 05/01/2018  . Baker's cyst of knee, left 05/01/2018  . Left leg pain 04/30/2018  . Vitamin D deficiency 08/30/2017  . HLD (hyperlipidemia) 06/26/2017  . Pre-diabetes 04/23/2017  . Other allergic rhinitis 12/12/2016  . 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 Toxic effect of venom of ants, unintentional 12/12/2016  . Exercise-induced bronchospasm 12/12/2016  . Acid reflux 04/18/2015  . 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 Anxiety 11/24/2010  . NEOPLASM, MALIGNANT, OVARY, HX OF 12/05/2009  . CONSTIPATION, CHRONIC 09/06/2009  . POSTMENOPAUSAL ATROPHIC VAGINITIS 05/24/2009  . EXTERNAL HEMORRHOIDS 10/08/2007  . DIVERTICULOSIS, COLON 10/08/2007  . Morbid obesity due to excess calories ([place]) 05/02/2006  . HYPERTENSION, BENIGN SYSTEMIC 05/02/2006    Current Outpatient Medications on File Prior to Visit  Medication Sig Dispense Refill  . clindamycin (CLINDAGEL) 1 % gel APP EXT TO FACE IN THE MORNING    . docusate sodium (COLACE) 100 MG capsule Take 1 capsule (100 mg total) by mouth 2 (two) times daily. 10 capsule 0  . hydrochlorothiazide (HYDRODIURIL) 25 MG tablet take 25mg  by mouth once daily 90 tablet 3  . promethazine (PHENERGAN) 25 MG tablet Take 1 tablet (25 mg total) by mouth every 8 (eight) hours as needed for nausea or vomiting. 20 tablet 0  . tretinoin  (RETIN-A) 0.025 % cream APP A THIN LAYER TO FACE NIGHTLY     No current facility-administered medications on file prior to visit.     Allergies  Allergen Reactions  . Other     general anesthesia - upsets stomach   . Penicillins Hives and Itching    Has patient had a PCN reaction causing immediate rash, facial/tongue/throat swelling, SOB or lightheadedness with hypotension: Yes Has patient had a PCN reaction causing severe rash involving mucus membranes or skin necrosis: Yes Has patient had a PCN reaction that required hospitalization: No Has patient had a PCN reaction occurring within the last 10 years: No If all of the above answers are "NO", then may proceed with Cephalosporin use.     Objective: There were no vitals filed for this visit.  General: No acute distress, AAOx3  Left foot: Incisions healing well at surgical sites, mild swelling to left, no erythema, no warmth, no drainage, no signs of infection noted, Capillary fill time <3 seconds in all digits, gross sensation present via light touch to left foot. No pain or crepitation with range of motion left foot.  No pain with calf compression.   Xrays No acute findings, consistent with post op status   Assessment and Plan:  Problem List Items Addressed This Visit    None    Visit Diagnoses    S/P foot surgery, left    -  Primary   Plantar fasciitis       Hammer toe of left foot       Left foot pain           -  Patient seen and evaluated -Compression sleeve dispensed -Advised patient to continue with activities to tolerance and reports that occasional pain is acceptable as long as it goes away with rest ice and elevation -Encourage patient to get new supportive shoes for foot type -Return as needed.  In the meantime, patient to call office if any issues or problems arise.   Landis Martins, DPM

## 2018-11-07 ENCOUNTER — Ambulatory Visit (INDEPENDENT_AMBULATORY_CARE_PROVIDER_SITE_OTHER): Payer: BC Managed Care – PPO

## 2018-11-07 ENCOUNTER — Other Ambulatory Visit: Payer: Self-pay

## 2018-11-07 DIAGNOSIS — Z23 Encounter for immunization: Secondary | ICD-10-CM | POA: Diagnosis not present

## 2018-11-07 NOTE — Progress Notes (Signed)
Patient presents in nurse clinic for Flu vaccine. Vaccine given, LD. Patient tolerated well. 

## 2018-12-02 ENCOUNTER — Ambulatory Visit: Payer: BC Managed Care – PPO | Admitting: Family Medicine

## 2018-12-23 ENCOUNTER — Other Ambulatory Visit: Payer: Self-pay

## 2018-12-23 ENCOUNTER — Encounter: Payer: Self-pay | Admitting: Family Medicine

## 2018-12-23 ENCOUNTER — Ambulatory Visit: Payer: BC Managed Care – PPO | Admitting: Family Medicine

## 2018-12-23 VITALS — BP 144/80 | HR 104

## 2018-12-23 DIAGNOSIS — Z0000 Encounter for general adult medical examination without abnormal findings: Secondary | ICD-10-CM | POA: Diagnosis not present

## 2018-12-23 DIAGNOSIS — I10 Essential (primary) hypertension: Secondary | ICD-10-CM | POA: Diagnosis not present

## 2018-12-23 LAB — POCT GLYCOSYLATED HEMOGLOBIN (HGB A1C): Hemoglobin A1C: 5.4 % (ref 4.0–5.6)

## 2018-12-23 NOTE — Assessment & Plan Note (Signed)
BP mildly elevated today, patient notes that this happens sometimes when she is at the doctor's office.  Will recheck at next visit.  Will check BMP given hydrochlorothiazide use.  Continue hydrochlorothiazide 25 mg daily.

## 2018-12-23 NOTE — Assessment & Plan Note (Signed)
Medications and problem list reviewed and updated as needed.  Perform BMP and lipid panel today.  Advised patient to obtain shingles vaccination.  Other health maintenance reviewed and up-to-date.  Will address Pap smear at next visit.

## 2018-12-23 NOTE — Assessment & Plan Note (Addendum)
Encourage patient to return to [facility] and to return to dietitian.  Advised exercise, including swimming.  Follow-up in 6 months.  We will check lipids and A1c today.

## 2018-12-23 NOTE — Progress Notes (Signed)
Subjective:  CC -- Annual Physical; With no complaints  Hypertension: - Medications: HCTZ 25MG  QD - Compliance: good - Checking BP at home: no - Denies any SOB, CP, vision changes, LE edema, medication SEs, or symptoms of hypotension - Diet: tries to eat healthy - Exercise: none  Hx Gastric Banding  Many years ago Has not been to [facility] in over a year, but thinking about going back Has not been exercising due to foot pain   Patient presents asking if she should receive a pneumonia vaccination or shingles vaccination.  States that she has been seeing commercials about this on TV and wanted to know if it something that she needs.   Cardiovascular: - Risk as of 10/20: The 10-year ASCVD risk score Mikey Bussing DC Jr., et al., 2013) is: 4.8%   Values used to calculate the score:     Age: 53 years     Sex: Female     Is Non-Hispanic African American: Yes     Diabetic: No     Tobacco smoker: No     Systolic Blood Pressure: 123456 mmHg     Is BP treated: Yes     HDL Cholesterol: 56 mg/dL     Total Cholesterol: 136 mg/dL - Dx Hypertension: yes  - Dx Hyperlipidemia: no - Dx Obesity: yes, hx gastric banding, planning to go back to [facility] and dietician - Physical Activity: no, has been dealing with foot pain - Diabetes: no   Cancer: Colorectal >> Colonoscopy: yes, up-to-date, performed in 2018, note reviewed notes repeat in 10 years  Lung >> Tobacco Use: no Breast >> Mammogram: yes, up-to-date, performed on 09/2018, results reviewed, repeat in 1 year per note Cervical/Endometrial >>  - Pap Smear: no, last appears to be in 2013  - Previous Abnormal Pap: no  Skin >> Suspicious lesions: no   Social: Alcohol Use: no  Tobacco Use: no  Other Drugs: no  Risky Sexual Behavior: no Depression: no   - PHQ2 score: 2 Support and Life at Home: yes   Other: Zoster Vaccine: no, discussed with patient advised to obtain  Flu Vaccine: yes, received on 11/07/2018 Pneumonia  Vaccine: no, does not meet criteria for need    Patient reports no  vision/ hearing changes,anorexia, weight change, fever ,adenopathy, persistant / recurrent hoarseness, swallowing issues, chest pain, edema,persistant / recurrent cough, hemoptysis, dyspnea(rest, exertional, paroxysmal nocturnal), gastrointestinal  bleeding (melena, rectal bleeding), abdominal pain, excessive heart burn, GU symptoms(dysuria, hematuria, pyuria, voiding/incontinence  Issues) syncope, focal weakness, severe memory loss, concerning skin lesions, depression, anxiety, abnormal bruising/bleeding, major joint swelling, breast masses or abnormal vaginal bleeding.    Past Medical History Patient Active Problem List   Diagnosis Date Noted  . Osteoarthritis of left knee 05/01/2018  . Baker's cyst of knee, left 05/01/2018  . Left leg pain 04/30/2018  . Vitamin D deficiency 08/30/2017  . HLD (hyperlipidemia) 06/26/2017  . Pre-diabetes 04/23/2017  . Other allergic rhinitis 12/12/2016  . 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 Toxic effect of venom of ants, unintentional 12/12/2016  . Exercise-induced bronchospasm 12/12/2016  . Acid reflux 04/18/2015  . Annual physical exam 03/18/2015  . 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 Anxiety 11/24/2010  . NEOPLASM, MALIGNANT, OVARY, HX OF 12/05/2009  . CONSTIPATION, CHRONIC 09/06/2009  . POSTMENOPAUSAL ATROPHIC VAGINITIS 05/24/2009  . EXTERNAL HEMORRHOIDS 10/08/2007  . DIVERTICULOSIS, COLON 10/08/2007  . Morbid obesity (  [place]) 05/02/2006  . HYPERTENSION, BENIGN SYSTEMIC 05/02/2006    Medications- reviewed and updated Current Outpatient Medications  Medication Sig Dispense Refill  . hydrochlorothiazide (HYDRODIURIL) 25 MG tablet take 25mg  by mouth once daily 90 tablet 3  . tretinoin (RETIN-A) 0.025 % cream APP A THIN LAYER TO FACE NIGHTLY     No current  facility-administered medications for this visit.     Objective: BP (!) 144/80   Pulse (!) 104   SpO2 97%  Gen: NAD, alert, cooperative with exam Neck: supple, no thyromegaly palpated HEENT: NCAT, EOMI, PERRL CV: RRR, HR 80s on my exam, good S1/S2, no murmur Resp: CTABL, no wheezes, non-labored Abd: Soft, Non Tender, Non Distended, BS present, no guarding or organomegaly Genital Exam: not done Ext: None-Trace BLE edema, warm Neuro: Alert and oriented, No gross deficits   Assessment/Plan:  Annual physical exam Medications and problem list reviewed and updated as needed.  Perform BMP and lipid panel today.  Advised patient to obtain shingles vaccination.  Other health maintenance reviewed and up-to-date.  Will address Pap smear at next visit.  Morbid obesity ([place]) Encourage patient to return to [facility] and to return to dietitian.  Advised exercise, including swimming.  Follow-up in 6 months.  We will check lipids and A1c today.  HYPERTENSION, BENIGN SYSTEMIC BP mildly elevated today, patient notes that this happens sometimes when she is at the doctor's office.  Will recheck at next visit.  Will check BMP given hydrochlorothiazide use.  Continue hydrochlorothiazide 25 mg daily.   Orders Placed This Encounter  Procedures  . Lipid panel    Order Specific Question:   Has the patient fasted?    Answer:   No  . Basic Metabolic Panel    Order Specific Question:   Has the patient fasted?    Answer:   No  . HgB A1c    No orders of the defined types were placed in this encounter.    [place] Constable, DO, PGY-2 12/23/2018 5:22 PM

## 2018-12-23 NOTE — Patient Instructions (Signed)
Thank you for coming to see me today. It was a pleasure. Today we talked about:   Go to the pharmacy to get a shingles vaccination.  We will get your bloodwork today.  If it's abnormal and we need to change something I will call, otherwise I will release it on mychart.  Please follow-up with me in 6 months or sooner as needed.  If you have any questions or concerns, please do not hesitate to call the office at [phone].  Best,   Arizona Constable, DO

## 2018-12-24 LAB — LIPID PANEL
Chol/HDL Ratio: 3.9 ratio (ref 0.0–4.4)
Cholesterol, Total: 229 mg/dL — ABNORMAL HIGH (ref 100–199)
HDL: 59 mg/dL (ref >39)
LDL Chol Calc (NIH): 153 mg/dL — ABNORMAL HIGH (ref 0–99)
Triglycerides: 97 mg/dL (ref 0–149)
VLDL Cholesterol Cal: 17 mg/dL (ref 5–40)

## 2018-12-24 LAB — BASIC METABOLIC PANEL
BUN/Creatinine Ratio: 29 — ABNORMAL HIGH (ref 9–23)
BUN: 19 mg/dL (ref 6–24)
CO2: 27 mmol/L (ref 20–29)
Calcium: 9.7 mg/dL (ref 8.7–10.2)
Chloride: 104 mmol/L (ref 96–106)
Creatinine, Ser: 0.66 mg/dL (ref 0.57–1.00)
GFR calc Af Amer: 116 mL/min/{1.73_m2} (ref >59)
GFR calc non Af Amer: 100 mL/min/{1.73_m2} (ref >59)
Glucose: 98 mg/dL (ref 65–99)
Potassium: 4.2 mmol/L (ref 3.5–5.2)
Sodium: 143 mmol/L (ref 134–144)

## 2019-03-19 ENCOUNTER — Encounter: Payer: Self-pay | Admitting: Sports Medicine

## 2019-05-02 ENCOUNTER — Ambulatory Visit: Payer: BC Managed Care – PPO | Attending: Internal Medicine

## 2019-05-02 DIAGNOSIS — Z23 Encounter for immunization: Secondary | ICD-10-CM

## 2019-05-02 NOTE — Progress Notes (Signed)
   Covid-19 Vaccination Clinic  Name:  [name]    MRN: [id number] DOB: 1965/06/22  05/02/2019  [name] was observed post Covid-19 immunization for 15 minutes without incidence. She was provided with Vaccine Information Sheet and instruction to access the V-Safe system.   [name] was instructed to call 911 with any severe reactions post vaccine: Marland Kitchen Difficulty breathing  . Swelling of your face and throat  . A fast heartbeat  . A bad rash all over your body  . Dizziness and weakness    Immunizations Administered    Name Date Dose VIS Date Route   Pfizer COVID-19 Vaccine 05/02/2019  1:57 PM 0.3 mL 02/13/2019 Intramuscular   Manufacturer: [place]   Lot: [id number]   [place]: [id number]

## 2019-05-23 ENCOUNTER — Ambulatory Visit: Payer: BC Managed Care – PPO | Attending: Internal Medicine

## 2019-05-23 DIAGNOSIS — Z23 Encounter for immunization: Secondary | ICD-10-CM

## 2019-05-23 NOTE — Progress Notes (Signed)
   Covid-19 Vaccination Clinic  Name:  [name]    MRN: [id number] DOB: [birth year]  05/23/2019  [name] was observed post Covid-19 immunization for 15 minutes without incident. She was provided with Vaccine Information Sheet and instruction to access the V-Safe system.   [name] was instructed to call 911 with any severe reactions post vaccine: Marland Kitchen Difficulty breathing  . Swelling of face and throat  . A fast heartbeat  . A bad rash all over body  . Dizziness and weakness   Immunizations Administered    Name Date Dose VIS Date Route   Pfizer COVID-19 Vaccine 05/23/2019  1:59 PM 0.3 mL 02/13/2019 Intramuscular   Manufacturer: [place]   Lot: [id number]   [place]: [id number]

## 2019-08-04 ENCOUNTER — Encounter: Payer: Self-pay | Admitting: Sports Medicine

## 2019-08-31 ENCOUNTER — Encounter: Payer: Self-pay | Admitting: Sports Medicine

## 2019-09-02 ENCOUNTER — Telehealth: Payer: Self-pay | Admitting: Sports Medicine

## 2019-09-02 ENCOUNTER — Other Ambulatory Visit: Payer: Self-pay

## 2019-09-02 MED ORDER — HYDROCHLOROTHIAZIDE 25 MG PO TABS: ORAL_TABLET | ORAL | 0 refills | Status: DC

## 2019-09-02 NOTE — Telephone Encounter (Signed)
Needs appointment

## 2019-09-02 NOTE — Telephone Encounter (Signed)
lvm to sched 09/02/19

## 2019-09-09 NOTE — Telephone Encounter (Signed)
Done. Appointment made for 8/2 at 3:50pm. Ottis Stain, [place]

## 2019-10-05 ENCOUNTER — Other Ambulatory Visit: Payer: Self-pay

## 2019-10-05 ENCOUNTER — Ambulatory Visit: Payer: BC Managed Care – PPO | Admitting: Family Medicine

## 2019-10-05 VITALS — BP 140/80 | HR 110 | Ht 65.0 in | Wt 332.0 lb

## 2019-10-05 DIAGNOSIS — Z86018 Personal history of other benign neoplasm: Secondary | ICD-10-CM

## 2019-10-05 DIAGNOSIS — Z9889 Other specified postprocedural states: Secondary | ICD-10-CM

## 2019-10-05 DIAGNOSIS — R000 Tachycardia, unspecified: Secondary | ICD-10-CM

## 2019-10-05 DIAGNOSIS — I10 Essential (primary) hypertension: Secondary | ICD-10-CM | POA: Diagnosis not present

## 2019-10-05 MED ORDER — HYDROCHLOROTHIAZIDE 25 MG PO TABS: ORAL_TABLET | ORAL | 2 refills | Status: DC

## 2019-10-05 NOTE — Assessment & Plan Note (Signed)
Resolved on examination and patient also wears a heart rate monitor that shows that her heart rate is usually in the 80-90 range at rest.  Advised patient that if she sees consistently elevated heart rates in the 100s while she is resting to come back.  She is reassured that this was likely secondary to being anxious at the doctor and also having her heart rate checked right after walking.

## 2019-10-05 NOTE — Progress Notes (Signed)
    SUBJECTIVE:   CHIEF COMPLAINT / HPI:   Hypertension Last visit on 12/23/2018 Last BMP at that time Current regimen: HCTZ 25 mg daily Denies shortness of breath, chest pain, vision changes, lower extremity edema, symptoms of hypotension Usually 120/80 at home, states that when she comes in, it is up Has been very stressed with work  Tachycardia HR 110 on presentation Patient is very concerned that her heart rate is still elevated and wants to know like to be this way Denies chest pain, shortness of breath Patient wears a daily heart rate monitor with a Fitbit On HR monitor on her wrist, she is usually 80-90s  History of struma ovarii with carcinoid focus Has an appointment coming up with Dr. Gala Romney   Wants blood drawn in October and November  Colonscopy in 2018, f/u 10 years  PERTINENT  PMH / PSH: Hypertension, OSA, chronic constipation, history of gastric banding, hyperlipidemia, morbid obesity, vitamin D deficiency  OBJECTIVE:   BP 140/80   Pulse (!) 110   Ht 5\' 5"  (1.651 m)   Wt (!) 332 lb (150.6 kg)   SpO2 98%   BMI 55.25 kg/m    Physical Exam:  General: 54 y.o. female in NAD Cardio: RRR no m/r/g, heart rate 80 on exam Lungs: CTAB, no wheezing, no rhonchi, no crackles, no IWOB on RA Skin: warm and dry Extremities: No edema   ASSESSMENT/PLAN:   HYPERTENSION, BENIGN SYSTEMIC BP elevated today, reports that BP is within normal limits at home.  Given that this continues to occur, there is likely some component of whitecoat hypertension.  Continue with current regimen, HCTZ 25 mg daily.  Last BMP was within normal limits in October 2020.  Patient would like to wait to have all of her labs performed in October when she will also have a lipid panel.  Tachycardia Resolved on examination and patient also wears a heart rate monitor that shows that her heart rate is usually in the 80-90 range at rest.  Advised patient that if she sees consistently elevated heart  rates in the 100s while she is resting to come back.  She is reassured that this was likely secondary to being anxious at the doctor and also having her heart rate checked right after walking.  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 Gala Romney for monitoring.     Cleophas Dunker, [place]

## 2019-10-05 NOTE — Assessment & Plan Note (Signed)
She will follow-up with Dr. Gala Romney for monitoring.

## 2019-10-05 NOTE — Assessment & Plan Note (Signed)
BP elevated today, reports that BP is within normal limits at home.  Given that this continues to occur, there is likely some component of whitecoat hypertension.  Continue with current regimen, HCTZ 25 mg daily.  Last BMP was within normal limits in October 2020.  Patient would like to wait to have all of her labs performed in October when she will also have a lipid panel.

## 2019-10-05 NOTE — Patient Instructions (Signed)
Thank you for coming to see me today. It was a pleasure. Today we talked about:   I sent refills for your blood pressure medication.  Check your heart rate on your watch and let me know if it is consistently high (<100).  Please follow-up with me in October or November.  If you have any questions or concerns, please do not hesitate to call the office at [phone].  Best,   Arizona Constable, DO

## 2019-10-07 ENCOUNTER — Encounter: Payer: Self-pay | Admitting: Obstetrics & Gynecology

## 2019-10-07 ENCOUNTER — Ambulatory Visit (INDEPENDENT_AMBULATORY_CARE_PROVIDER_SITE_OTHER): Payer: BC Managed Care – PPO | Admitting: Obstetrics & Gynecology

## 2019-10-07 ENCOUNTER — Other Ambulatory Visit: Payer: Self-pay

## 2019-10-07 VITALS — BP 133/83 | HR 97 | Resp 16 | Ht 65.0 in | Wt 331.0 lb

## 2019-10-07 DIAGNOSIS — E559 Vitamin D deficiency, unspecified: Secondary | ICD-10-CM

## 2019-10-07 DIAGNOSIS — Z01411 Encounter for gynecological examination (general) (routine) with abnormal findings: Secondary | ICD-10-CM | POA: Diagnosis not present

## 2019-10-07 DIAGNOSIS — Z8543 Personal history of malignant neoplasm of ovary: Secondary | ICD-10-CM

## 2019-10-07 DIAGNOSIS — N907 Vulvar cyst: Secondary | ICD-10-CM

## 2019-10-07 DIAGNOSIS — L723 Sebaceous cyst: Secondary | ICD-10-CM

## 2019-10-07 DIAGNOSIS — Z9889 Other specified postprocedural states: Secondary | ICD-10-CM

## 2019-10-07 DIAGNOSIS — Z86018 Personal history of other benign neoplasm: Secondary | ICD-10-CM

## 2019-10-08 ENCOUNTER — Encounter: Payer: Self-pay | Admitting: Family Medicine

## 2019-10-08 LAB — COMPREHENSIVE METABOLIC PANEL
AG Ratio: 1.5 (calc) (ref 1.0–2.5)
ALT: 21 U/L (ref 6–29)
AST: 22 U/L (ref 10–35)
Albumin: 4.3 g/dL (ref 3.6–5.1)
Alkaline phosphatase (APISO): 81 U/L (ref 37–153)
BUN: 17 mg/dL (ref 7–25)
CO2: 33 mmol/L — ABNORMAL HIGH (ref 20–32)
Calcium: 9.5 mg/dL (ref 8.6–10.4)
Chloride: 101 mmol/L (ref 98–110)
Creat: 0.72 mg/dL (ref 0.50–1.05)
Globulin: 2.9 g/dL (calc) (ref 1.9–3.7)
Glucose, Bld: 112 mg/dL (ref 65–139)
Potassium: 3.7 mmol/L (ref 3.5–5.3)
Sodium: 139 mmol/L (ref 135–146)
Total Bilirubin: 0.6 mg/dL (ref 0.2–1.2)
Total Protein: 7.2 g/dL (ref 6.1–8.1)

## 2019-10-08 LAB — CBC
HCT: 40.1 % (ref 35.0–45.0)
Hemoglobin: 13.3 g/dL (ref 11.7–15.5)
MCH: 28.1 pg (ref 27.0–33.0)
MCHC: 33.2 g/dL (ref 32.0–36.0)
MCV: 84.6 fL (ref 80.0–100.0)
MPV: 9.2 fL (ref 7.5–12.5)
Platelets: 346 10*3/uL (ref 140–400)
RBC: 4.74 10*6/uL (ref 3.80–5.10)
RDW: 13.5 % (ref 11.0–15.0)
WBC: 8 10*3/uL (ref 3.8–10.8)

## 2019-10-08 NOTE — Progress Notes (Signed)
Subjective:     [name] is a 54 y.o. female here for a routine exam.  Current complaints: right vulvar sebaceous cyst. Works as Insurance account manager.  [place] for primary care   Gynecologic History No LMP recorded. Patient has had a hysterectomy. Contraception: status post hysterectomy Last Pap: 2013--normal and negative for dysplasia on hysterectomy specimen.  Last mammogram: July 2020. Results were: normal  Obstetric History OB History  Gravida Para Term Preterm AB Living  2 2 2     2   SAB TAB Ectopic Multiple Live Births               # Outcome Date GA Lbr Len/2nd Weight Sex Delivery Anes PTL Lv  2 Term      CS-Unspec     1 Term      CS-Unspec        The following portions of the patient's history were reviewed and updated as appropriate: allergies, current medications, past family history, past medical history, past social history, past surgical history and problem list.  Review of Systems Pertinent items noted in HPI and remainder of comprehensive ROS otherwise negative.    Objective:      Vitals:   10/07/19 1356  BP: 133/83  Pulse: 97  Resp: 16  Weight: (!) 331 lb (150.1 kg)  Height: 5\' 5"  (1.651 m)   Vitals:  WNL General appearance: alert, cooperative and no distress  HEENT: Normocephalic, without obvious abnormality, atraumatic Eyes: negative Throat: lips, mucosa, and tongue normal; teeth and gums normal  Respiratory: Clear to auscultation bilaterally  CV: Regular rate and rhythm  Breasts:  Normal appearance, no masses or tenderness, no nipple retraction or dimpling  GI: Soft, non-tender; bowel sounds normal; no masses,  no organomegaly  GU: External Genitalia:  Tanner V 0.75 cm sebaceous cyst on the right labia.   Urethra:  No prolapse   Vagina: Pink, normal rugae, no blood or discharge  Cervix: surgically absent  Uterus:  surgically absent  Adnexa: surgically absent  Musculoskeletal: No edema, redness or tenderness in the calves  or thighs  Skin: No lesions or rash  Lymphatic: Axillary adenopathy: none     Psychiatric: Normal mood and behavior        Assessment:    Healthy female exam.   Right sebaceous cyst Vit D deficiency Hypertension    Plan:    No pap required (s/p hyst) Yearly Mammogram History of dermoid with carcinoid focus.  Will get CT to evaluate for recurrence. Removal of sebaceous cyst  REMOVAL OF SEBACEOUS CYST  Pt requesting removal of sebaceous cyst.   Risks of the biopsy including pain, bleeding, infection, inadequate specimen, recurrence, scarring and need for additional procedures  were discussed. The patient stated understanding and agreed to undergo procedure today. Consent was signed,  time out performed.  The patient's vulva was prepped with Betadine. 1% lidocaine was injected into right labia majora.  Scalpel was used to incise the skin over the sebaceous cyst.  Pick ups were used to remove the cyst wall of the sebaceous cyst.  Minimal bleeding controlled with silver nitrate.  Post-procedure instructions  (pelvic rest for one week) were given to the patient. The patient is to call with heavy bleeding, fever greater than 100.4, foul smelling vaginal discharge or other concerns.

## 2019-10-12 ENCOUNTER — Encounter: Payer: Self-pay | Admitting: Family Medicine

## 2019-10-12 ENCOUNTER — Other Ambulatory Visit: Payer: Self-pay | Admitting: Family Medicine

## 2019-10-12 DIAGNOSIS — E559 Vitamin D deficiency, unspecified: Secondary | ICD-10-CM

## 2019-10-12 DIAGNOSIS — Z1231 Encounter for screening mammogram for malignant neoplasm of breast: Secondary | ICD-10-CM

## 2019-10-12 DIAGNOSIS — R000 Tachycardia, unspecified: Secondary | ICD-10-CM

## 2019-10-15 ENCOUNTER — Ambulatory Visit: Payer: BC Managed Care – PPO | Admitting: Student in an Organized Health Care Education/Training Program

## 2019-10-15 ENCOUNTER — Other Ambulatory Visit: Payer: BC Managed Care – PPO

## 2019-10-15 ENCOUNTER — Other Ambulatory Visit: Payer: Self-pay

## 2019-10-15 VITALS — BP 154/72 | HR 97 | Wt 330.0 lb

## 2019-10-15 DIAGNOSIS — M545 Low back pain, unspecified: Secondary | ICD-10-CM

## 2019-10-15 DIAGNOSIS — Z711 Person with feared health complaint in whom no diagnosis is made: Secondary | ICD-10-CM

## 2019-10-15 DIAGNOSIS — M549 Dorsalgia, unspecified: Secondary | ICD-10-CM | POA: Diagnosis not present

## 2019-10-15 DIAGNOSIS — E559 Vitamin D deficiency, unspecified: Secondary | ICD-10-CM

## 2019-10-15 DIAGNOSIS — R000 Tachycardia, unspecified: Secondary | ICD-10-CM

## 2019-10-15 LAB — POCT URINALYSIS DIP (MANUAL ENTRY)
Bilirubin, UA: NEGATIVE
Glucose, UA: NEGATIVE mg/dL
Ketones, POC UA: NEGATIVE mg/dL
Leukocytes, UA: NEGATIVE
Nitrite, UA: NEGATIVE
Protein Ur, POC: NEGATIVE mg/dL
Spec Grav, UA: >=1.03 — AB (ref 1.010–1.025)
Urobilinogen, UA: 0.2 E.U./dL
pH, UA: 6 (ref 5.0–8.0)

## 2019-10-15 LAB — POCT UA - MICROSCOPIC ONLY

## 2019-10-15 NOTE — Patient Instructions (Signed)
It was a pleasure to see you today!  To summarize our discussion for this visit:  Your urine was showing signs of dehydration but looked like a normal amount of blood. Please hydrate really well and follow up with your PCP if you'd like to recheck your urine.  Your back pain is likely unrelated. I would recommend you stretch and strengthen your back muscles to help prevent further pain or injuries  Some additional health maintenance measures we should update are: Health Maintenance Due  Topic Date Due   MAMMOGRAM  10/03/2019   INFLUENZA VACCINE  10/04/2019      Call the clinic at [phone] if your symptoms worsen or you have any concerns.   Thank you for allowing me to take part in your care,  Dr. Doristine Mango   Low Back Sprain or Strain Rehab Ask your health care provider which exercises are safe for you. Do exercises exactly as told by your health care provider and adjust them as directed. It is normal to feel mild stretching, pulling, tightness, or discomfort as you do these exercises. Stop right away if you feel sudden pain or your pain gets worse. Do not begin these exercises until told by your health care provider. Stretching and range-of-motion exercises These exercises warm up your muscles and joints and improve the movement and flexibility of your back. These exercises also help to relieve pain, numbness, and tingling. Lumbar rotation  1. Lie on your back on a firm surface and bend your knees. 2. Straighten your arms out to your sides so each arm forms a 90-degree angle (right angle) with a side of your body. 3. Slowly move (rotate) both of your knees to one side of your body until you feel a stretch in your lower back (lumbar). Try not to let your shoulders lift off the floor. 4. Hold this position for __________ seconds. 5. Tense your abdominal muscles and slowly move your knees back to the starting position. 6. Repeat this exercise on the other side of your  body. Repeat __________ times. Complete this exercise __________ times a day. Single knee to chest  1. Lie on your back on a firm surface with both legs straight. 2. Bend one of your knees. Use your hands to move your knee up toward your chest until you feel a gentle stretch in your lower back and buttock. ? Hold your leg in this position by holding on to the front of your knee. ? Keep your other leg as straight as possible. 3. Hold this position for __________ seconds. 4. Slowly return to the starting position. 5. Repeat with your other leg. Repeat __________ times. Complete this exercise __________ times a day. Prone extension on elbows  1. Lie on your abdomen on a firm surface (prone position). 2. Prop yourself up on your elbows. 3. Use your arms to help lift your chest up until you feel a gentle stretch in your abdomen and your lower back. ? This will place some of your body weight on your elbows. If this is uncomfortable, try stacking pillows under your chest. ? Your hips should stay down, against the surface that you are lying on. Keep your hip and back muscles relaxed. 4. Hold this position for __________ seconds. 5. Slowly relax your upper body and return to the starting position. Repeat __________ times. Complete this exercise __________ times a day. Strengthening exercises These exercises build strength and endurance in your back. Endurance is the ability to use your muscles for  a long time, even after they get tired. Pelvic tilt This exercise strengthens the muscles that lie deep in the abdomen. 1. Lie on your back on a firm surface. Bend your knees and keep your feet flat on the floor. 2. Tense your abdominal muscles. Tip your pelvis up toward the ceiling and flatten your lower back into the floor. ? To help with this exercise, you may place a small towel under your lower back and try to push your back into the towel. 3. Hold this position for __________ seconds. 4. Let your  muscles relax completely before you repeat this exercise. Repeat __________ times. Complete this exercise __________ times a day. Alternating arm and leg raises  1. Get on your hands and knees on a firm surface. If you are on a hard floor, you may want to use padding, such as an exercise mat, to cushion your knees. 2. Line up your arms and legs. Your hands should be directly below your shoulders, and your knees should be directly below your hips. 3. Lift your left leg behind you. At the same time, raise your right arm and straighten it in front of you. ? Do not lift your leg higher than your hip. ? Do not lift your arm higher than your shoulder. ? Keep your abdominal and back muscles tight. ? Keep your hips facing the ground. ? Do not arch your back. ? Keep your balance carefully, and do not hold your breath. 4. Hold this position for __________ seconds. 5. Slowly return to the starting position. 6. Repeat with your right leg and your left arm. Repeat __________ times. Complete this exercise __________ times a day. Abdominal set with straight leg raise  1. Lie on your back on a firm surface. 2. Bend one of your knees and keep your other leg straight. 3. Tense your abdominal muscles and lift your straight leg up, 4-6 inches (10-15 cm) off the ground. 4. Keep your abdominal muscles tight and hold this position for __________ seconds. ? Do not hold your breath. ? Do not arch your back. Keep it flat against the ground. 5. Keep your abdominal muscles tense as you slowly lower your leg back to the starting position. 6. Repeat with your other leg. Repeat __________ times. Complete this exercise __________ times a day. Single leg lower with bent knees 1. Lie on your back on a firm surface. 2. Tense your abdominal muscles and lift your feet off the floor, one foot at a time, so your knees and hips are bent in 90-degree angles (right angles). ? Your knees should be over your hips and your lower  legs should be parallel to the floor. 3. Keeping your abdominal muscles tense and your knee bent, slowly lower one of your legs so your toe touches the ground. 4. Lift your leg back up to return to the starting position. ? Do not hold your breath. ? Do not let your back arch. Keep your back flat against the ground. 5. Repeat with your other leg. Repeat __________ times. Complete this exercise __________ times a day. Posture and body mechanics Good posture and healthy body mechanics can help to relieve stress in your body's tissues and joints. Body mechanics refers to the movements and positions of your body while you do your daily activities. Posture is part of body mechanics. Good posture means:  Your spine is in its natural S-curve position (neutral).  Your shoulders are pulled back slightly.  Your head is not tipped forward.  Follow these guidelines to improve your posture and body mechanics in your everyday activities. Standing   When standing, keep your spine neutral and your feet about hip width apart. Keep a slight bend in your knees. Your ears, shoulders, and hips should line up.  When you do a task in which you stand in one place for a long time, place one foot up on a stable object that is 2-4 inches (5-10 cm) high, such as a footstool. This helps keep your spine neutral. Sitting   When sitting, keep your spine neutral and keep your feet flat on the floor. Use a footrest, if necessary, and keep your thighs parallel to the floor. Avoid rounding your shoulders, and avoid tilting your head forward.  When working at a desk or a computer, keep your desk at a height where your hands are slightly lower than your elbows. Slide your chair under your desk so you are close enough to maintain good posture.  When working at a computer, place your monitor at a height where you are looking straight ahead and you do not have to tilt your head forward or downward to look at the  screen. Resting  When lying down and resting, avoid positions that are most painful for you.  If you have pain with activities such as sitting, bending, stooping, or squatting, lie in a position in which your body does not bend very much. For example, avoid curling up on your side with your arms and knees near your chest (fetal position).  If you have pain with activities such as standing for a long time or reaching with your arms, lie with your spine in a neutral position and bend your knees slightly. Try the following positions: ? Lying on your side with a pillow between your knees. ? Lying on your back with a pillow under your knees. Lifting   When lifting objects, keep your feet at least shoulder width apart and tighten your abdominal muscles.  Bend your knees and hips and keep your spine neutral. It is important to lift using the strength of your legs, not your back. Do not lock your knees straight out.  Always ask for help to lift heavy or awkward objects. This information is not intended to replace advice given to you by your health care provider. Make sure you discuss any questions you have with your health care provider. Document Revised: 06/13/2018 Document Reviewed: 03/13/2018 Elsevier Patient Education  [place].

## 2019-10-15 NOTE — Progress Notes (Signed)
   SUBJECTIVE:   CHIEF COMPLAINT / HPI: UTI symptoms   Bleeding in urine since removal of cyst on 8/4. No dysuria. The bleeding has been improving and she cannot see any blood any more.  Having bilateral lumbar back pain starting on Friday There is only blood when she pees.  She had kidney stones in the remote past and thinks this feels similar.  LMP 2007- had hysterectomy.   OBJECTIVE:   BP (!) 154/72   Pulse 97   Wt (!) 330 lb (149.7 kg)   SpO2 97%   BMI 54.91 kg/m   General: NAD, able to participate in exam Abdomen: soft, nontender, nondistended, no hepatic or splenomegaly, +BS Back: tender to palpation of paraspinal lumbar musculature Extremities: no edema or cyanosis. WWP. Skin: warm and dry, no rashes noted Neuro: alert and oriented x4, no focal deficits Psych: Normal affect and mood  ASSESSMENT/PLAN:   Concern about urinary tract disease without diagnosis Patient concerned that she has hematuria.  Urinalysis was normal. Discoloration of urine that patient had seen likely from her recent procedure or food or another source. precepted with Dr. Dannial Monarch Attempted to reassure patient with her results. She will likely want to recheck for verification  Lower back pain Unlikely to be related to urine concerns. - no blood in urine or dysuria Patient described sedentary lifestyle.  Likely mild muscle strain Recommended stretching, exercise as tolerated. Provided with hand out     [place]

## 2019-10-16 LAB — TSH: TSH: 2.23 u[IU]/mL (ref 0.450–4.500)

## 2019-10-16 LAB — VITAMIN D 25 HYDROXY (VIT D DEFICIENCY, FRACTURES): Vit D, 25-Hydroxy: 13.5 ng/mL — ABNORMAL LOW (ref 30.0–100.0)

## 2019-10-16 LAB — HEMOGLOBIN A1C
Est. average glucose Bld gHb Est-mCnc: 111 mg/dL
Hgb A1c MFr Bld: 5.5 % (ref 4.8–5.6)

## 2019-10-17 ENCOUNTER — Other Ambulatory Visit: Payer: Self-pay

## 2019-10-17 ENCOUNTER — Ambulatory Visit
Admission: RE | Admit: 2019-10-17 | Discharge: 2019-10-17 | Disposition: A | Discharge status: Home / Self Care | Payer: BC Managed Care – PPO | Source: Ambulatory Visit | Attending: Obstetrics & Gynecology | Admitting: Obstetrics & Gynecology

## 2019-10-17 DIAGNOSIS — Z8543 Personal history of malignant neoplasm of ovary: Secondary | ICD-10-CM

## 2019-10-17 MED ORDER — IOPAMIDOL (ISOVUE-300) INJECTION 61%
125.0000 mL | Freq: Once | INTRAVENOUS | Status: AC | PRN
  Administered 2019-10-17: 125 mL via INTRAVENOUS

## 2019-10-19 ENCOUNTER — Encounter: Payer: BC Managed Care – PPO | Admitting: Obstetrics and Gynecology

## 2019-10-20 ENCOUNTER — Other Ambulatory Visit: Payer: Self-pay | Admitting: Family Medicine

## 2019-10-20 ENCOUNTER — Encounter: Payer: Self-pay | Admitting: Family Medicine

## 2019-10-20 DIAGNOSIS — E559 Vitamin D deficiency, unspecified: Secondary | ICD-10-CM

## 2019-10-20 DIAGNOSIS — N2889 Other specified disorders of kidney and ureter: Secondary | ICD-10-CM

## 2019-10-20 MED ORDER — VITAMIN D (ERGOCALCIFEROL) 1.25 MG (50000 UNIT) PO CAPS: 50000.0000 [IU] | ORAL_CAPSULE | ORAL | 0 refills | Status: DC

## 2019-10-21 ENCOUNTER — Ambulatory Visit
Admission: RE | Admit: 2019-10-21 | Discharge: 2019-10-21 | Disposition: A | Discharge status: Home / Self Care | Payer: BC Managed Care – PPO | Source: Ambulatory Visit | Attending: *Deleted | Admitting: *Deleted

## 2019-10-21 ENCOUNTER — Other Ambulatory Visit: Payer: Self-pay

## 2019-10-21 DIAGNOSIS — Z711 Person with feared health complaint in whom no diagnosis is made: Secondary | ICD-10-CM | POA: Insufficient documentation

## 2019-10-21 DIAGNOSIS — Z1231 Encounter for screening mammogram for malignant neoplasm of breast: Secondary | ICD-10-CM

## 2019-10-21 NOTE — Assessment & Plan Note (Signed)
Unlikely to be related to urine concerns. - no blood in urine or dysuria Patient described sedentary lifestyle.  Likely mild muscle strain Recommended stretching, exercise as tolerated. Provided with hand out

## 2019-10-21 NOTE — Assessment & Plan Note (Signed)
Patient concerned that she has hematuria.  Urinalysis was normal. Discoloration of urine that patient had seen likely from her recent procedure or food or another source. precepted with Dr. Dannial Monarch Attempted to reassure patient with her results. She will likely want to recheck for verification

## 2019-10-28 ENCOUNTER — Ambulatory Visit: Payer: BC Managed Care – PPO | Admitting: Family Medicine

## 2019-10-28 ENCOUNTER — Encounter: Payer: Self-pay | Admitting: Family Medicine

## 2019-10-28 ENCOUNTER — Other Ambulatory Visit: Payer: Self-pay

## 2019-10-28 VITALS — BP 156/80 | HR 81 | Wt 337.0 lb

## 2019-10-28 DIAGNOSIS — K5909 Other constipation: Secondary | ICD-10-CM | POA: Diagnosis not present

## 2019-10-28 DIAGNOSIS — M545 Low back pain, unspecified: Secondary | ICD-10-CM

## 2019-10-28 LAB — POCT URINALYSIS DIP (MANUAL ENTRY)
Bilirubin, UA: NEGATIVE
Blood, UA: NEGATIVE
Glucose, UA: NEGATIVE mg/dL
Ketones, POC UA: NEGATIVE mg/dL
Leukocytes, UA: NEGATIVE
Nitrite, UA: NEGATIVE
Protein Ur, POC: NEGATIVE mg/dL
Spec Grav, UA: 1.025 (ref 1.010–1.025)
Urobilinogen, UA: 1 E.U./dL
pH, UA: 6.5 (ref 5.0–8.0)

## 2019-10-28 NOTE — Patient Instructions (Addendum)
Thank you for coming to see me today. It was a pleasure. Today we talked about:   Call [facility] at [phone] to make sure you get an appointment scheduled.  Try miralax twice a day.  Continue Senna.  You can titrate miralax as needed.    Please follow-up with me in October.  If you have any questions or concerns, please do not hesitate to call the office at [phone].  Best,   [place] Constable, DO

## 2019-10-28 NOTE — Progress Notes (Signed)
    SUBJECTIVE:   CHIEF COMPLAINT / HPI:   F/U Hematuria and Renal Mass Patient seen on 8/12 for hematuria and low back pain CT Abd/Pelvis also ordered by GYN Dr She was referred to urology for renal mass noted on CT States that she is doing better  UA negative today  Constipation Miralax Daily Senna Daily Last BM this AM Had to do enema to help her Today was runny UTD on colonscopy No rectal bleeding  PERTINENT  PMH / PSH: HTN, OSA, chronic constipation, Hx gastric banding, HLD, morbid obesity, Vit D deficiency  OBJECTIVE:   BP (!) 156/80   Pulse 81   Wt (!) 337 lb (152.9 kg)   SpO2 99%   BMI 56.08 kg/m    Physical Exam:  General: 54 y.o. female in NAD Lungs: Breathing comfortably on RA Skin: warm and dry Extremities: No edema, ambulating without difficulty   ASSESSMENT/PLAN:   Lower back pain Now resolved.  UA negative and no blood.  Has referral in for [facility], given number to call to ensure f/u is scheduled for renal mass.  Chronic constipation Colonoscopy in 2018, no polyps, advised 10 yr f/u.  Will increase miralax to BID and see if this helps.  Advised on how to titrate miralax for soft BMs.  Continue Senna.     Return in October for [facility] and [place], [place]

## 2019-10-29 NOTE — Assessment & Plan Note (Signed)
Colonoscopy in 2018, no polyps, advised 10 yr f/u.  Will increase miralax to BID and see if this helps.  Advised on how to titrate miralax for soft BMs.  Continue Senna.

## 2019-10-29 NOTE — Assessment & Plan Note (Signed)
Now resolved.  UA negative and no blood.  Has referral in for [facility], given number to call to ensure f/u is scheduled for renal mass.

## 2019-11-10 ENCOUNTER — Encounter: Payer: Self-pay | Admitting: *Deleted

## 2019-12-10 ENCOUNTER — Ambulatory Visit (INDEPENDENT_AMBULATORY_CARE_PROVIDER_SITE_OTHER): Payer: BC Managed Care – PPO

## 2019-12-10 ENCOUNTER — Other Ambulatory Visit: Payer: Self-pay

## 2019-12-10 DIAGNOSIS — Z23 Encounter for immunization: Secondary | ICD-10-CM | POA: Diagnosis not present

## 2019-12-10 NOTE — Progress Notes (Signed)
Patient presents in nurse clinic for Flu Vaccine.  Vaccine administered LD without complication.   See admin for details.

## 2019-12-19 ENCOUNTER — Ambulatory Visit: Payer: BC Managed Care – PPO | Attending: Internal Medicine

## 2019-12-19 DIAGNOSIS — Z23 Encounter for immunization: Secondary | ICD-10-CM

## 2019-12-19 NOTE — Progress Notes (Signed)
   Covid-19 Vaccination Clinic  Name:  [name]    MRN: [id number] DOB: 1965/05/15  12/19/2019  [name] was observed post Covid-19 immunization for 15 minutes without incident. She was provided with Vaccine Information Sheet and instruction to access the V-Safe system.   [name] was instructed to call 911 with any severe reactions post vaccine: Marland Kitchen Difficulty breathing  . Swelling of face and throat  . A fast heartbeat  . A bad rash all over body  . Dizziness and weakness

## 2019-12-21 ENCOUNTER — Ambulatory Visit
Admission: EM | Admit: 2019-12-21 | Discharge: 2019-12-21 | Discharge status: Against Medical Advice | Payer: BC Managed Care – PPO

## 2019-12-21 ENCOUNTER — Other Ambulatory Visit: Payer: Self-pay

## 2019-12-22 ENCOUNTER — Encounter (HOSPITAL_COMMUNITY): Payer: Self-pay

## 2019-12-22 ENCOUNTER — Ambulatory Visit (HOSPITAL_COMMUNITY)
Admission: EM | Admit: 2019-12-22 | Discharge: 2019-12-22 | Disposition: A | Discharge status: Home / Self Care | Payer: BC Managed Care – PPO | Attending: Family Medicine | Admitting: Family Medicine

## 2019-12-22 ENCOUNTER — Other Ambulatory Visit: Payer: Self-pay

## 2019-12-22 DIAGNOSIS — T50Z95A Adverse effect of other vaccines and biological substances, initial encounter: Secondary | ICD-10-CM

## 2019-12-22 MED ORDER — DEXAMETHASONE SODIUM PHOSPHATE 10 MG/ML IJ SOLN
INTRAMUSCULAR | Status: AC
  Filled 2019-12-22: qty 1

## 2019-12-22 MED ORDER — DEXAMETHASONE SODIUM PHOSPHATE 10 MG/ML IJ SOLN
10.0000 mg | Freq: Once | INTRAMUSCULAR | Status: AC
  Administered 2019-12-22: 10 mg via INTRAMUSCULAR

## 2019-12-22 NOTE — Discharge Instructions (Addendum)
Believe this is reaction to the vaccine.  Apply some ice to the arm for inflammation and swelling.  Steroid injection given here. Ibuprofen as needed.  Follow up as needed for continued or worsening symptoms

## 2019-12-22 NOTE — ED Triage Notes (Signed)
Pt presents with redness and swelling in the left arm and swelling in the left axilla x 3 days after [place] booster. States 1 night after the booster she felt she was dying as she was not able to "breath at all" and felt an air bubble in the throat and started breathing again. Denies any trouble breathing at this time.

## 2019-12-23 NOTE — ED Provider Notes (Signed)
Lynn    CSN: [id number] Arrival date & time: 12/22/19  1226      History   Chief Complaint Chief Complaint  Patient presents with   Arm Pain   Nasal Congestion    HPI [name] is a 54 y.o. female.   Pt is a 54 year old female that presents with possible reaction to left upper arm and lymph node swelling.  This started 3 days after getting the fibroid Covid booster.  This has somewhat improved.  Reporting that she had a choking episode 1 night ago.  Felt like she had a air bubble in her throat and something was blocking her airway.  This lasted for about 15 seconds or so.  No coughing, fevers.  Has been breathing normally since.  Mild sore throat but no trouble swallowing or breathing at this time.     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   Complication of anesthesia    UPSETS STOMACH   Hyperlipidemia    Hypertension    IBS (irritable bowel syndrome)    Morbid obesity ([facility])    PONV (postoperative nausea and vomiting)    Sinus complaint    Urticaria    Wears glasses     Patient Active Problem List   Diagnosis Date Noted   Concern about urinary tract disease without diagnosis 10/21/2019   Tachycardia 10/05/2019   Osteoarthritis of left knee 05/01/2018   Baker's cyst of knee, left 05/01/2018   Left leg pain 04/30/2018   Vitamin D deficiency 08/30/2017   HLD (hyperlipidemia) 06/26/2017   Lower back pain 06/01/2017   Pre-diabetes 04/23/2017   Other allergic rhinitis 12/12/2016   Allergy to insect bites and stings 12/12/2016   Toxic effect of venom of ants, unintentional 12/12/2016   Exercise-induced bronchospasm 12/12/2016   Acid reflux 04/18/2015   History of dermoid cyst excision--STRUMA OVARII with CARCINOID FOCUS 12/14/2014   H/O laparoscopic adjustable gastric banding 12/05/2012   Anxiety 11/24/2010   Chronic constipation 09/06/2009   POSTMENOPAUSAL  ATROPHIC VAGINITIS 05/24/2009   EXTERNAL HEMORRHOIDS 10/08/2007   DIVERTICULOSIS, COLON 10/08/2007   Morbid obesity ([place]) 05/02/2006   HYPERTENSION, BENIGN SYSTEMIC 05/02/2006    Past Surgical History:  Procedure Laterality Date   ABDOMINAL HYSTERECTOMY     COMPLETE   CHOLECYSTECTOMY  1990   COLONOSCOPY WITH PROPOFOL N/A 08/22/2016   Procedure: COLONOSCOPY WITH PROPOFOL;  Surgeon: Arta Silence, MD;  Location: [facility];  Service: Endoscopy;  Laterality: N/A;   ESOPHAGEAL MANOMETRY N/A 07/06/2013   Procedure: ESOPHAGEAL MANOMETRY (EM);  Surgeon: Arta Silence, MD;  Location: [facility];  Service: Endoscopy;  Laterality: N/A;   GASTRIC RESTRICTION SURGERY  2010   lap band   HEMORROIDECTOMY      OB History    Gravida  2   Para  2   Term  2   Preterm      AB      Living  2     SAB      TAB      Ectopic      Multiple      Live Births               Home Medications    Prior to Admission medications   Medication Sig Start Date End Date Taking? Authorizing Provider  hydrochlorothiazide (HYDRODIURIL) 25 MG tablet take 25mg  by mouth once daily 10/05/19   Meccariello, Bernita Raisin, DO  Vitamin D, Ergocalciferol, (DRISDOL) 1.25 MG (50000 UNIT) CAPS capsule Take 1 capsule (50,000 Units total) by mouth every 7 (seven) days. 10/20/19   Meccariello, Bernita Raisin, DO    Family History Family History  Problem Relation Age of Onset   Thyroid cancer Mother    Hypertension Mother    Hyperlipidemia Mother    Breast cancer Other        aunts   Breast cancer Maternal Aunt    Breast cancer Maternal Aunt    Asthma Sister    Allergic rhinitis Neg Hx    Angioedema Neg Hx    Eczema Neg Hx    Immunodeficiency Neg Hx    Urticaria Neg Hx     Social History Social History   Tobacco Use   Smoking status: Never Smoker   Smokeless tobacco: Never Used  Vaping Use   Vaping Use: Never used  Substance Use Topics   Alcohol use: No   Drug use: No      Allergies   Other and Penicillins   Review of Systems Review of Systems   Physical Exam Triage Vital Signs ED Triage Vitals  Enc Vitals Group     BP 12/22/19 1333 (!) 133/92     Pulse Rate 12/22/19 1333 76     Resp 12/22/19 1333 20     Temp 12/22/19 1333 97.6 F (36.4 C)     Temp Source 12/22/19 1333 Oral     SpO2 12/22/19 1333 96 %     Weight --      Height --      Head Circumference --      Peak Flow --      Pain Score 12/22/19 1331 3     Pain Loc --      Pain Edu? --      Excl. in [place]? --    No data found.  Updated Vital Signs BP (!) 133/92 (BP Location: Right Wrist)    Pulse 76    Temp 97.6 F (36.4 C) (Oral)    Resp 20    SpO2 96%   Visual Acuity Right Eye Distance:   Left Eye Distance:   Bilateral Distance:    Right Eye Near:   Left Eye Near:    Bilateral Near:     Physical Exam Vitals and nursing note reviewed.  Constitutional:      General: She is not in acute distress.    Appearance: Normal appearance. She is not ill-appearing, toxic-appearing or diaphoretic.  HENT:     Head: Normocephalic.     Nose: Nose normal.     Mouth/Throat:     Pharynx: Oropharynx is clear.     Comments: Prominent epiglottis without erythema Eyes:     Conjunctiva/sclera: Conjunctivae normal.  Pulmonary:     Effort: Pulmonary effort is normal.     Breath sounds: Normal breath sounds.  Musculoskeletal:        General: Normal range of motion.     Cervical back: Normal range of motion.  Skin:    General: Skin is warm and dry.     Findings: No rash.     Comments: Red raised area to the left upper arm. Mildly tender.   Neurological:     Mental Status: She is alert.  Psychiatric:        Mood and Affect: Mood normal.      UC Treatments / Results  Labs (all labs ordered are listed, but only abnormal results are displayed) Labs  Reviewed - No data to display  EKG   Radiology No results found.  Procedures Procedures (including critical care  time)  Medications Ordered in UC Medications  dexamethasone (DECADRON) injection 10 mg (10 mg Intramuscular Given 12/22/19 1440)    Initial Impression / Assessment and Plan / UC Course  I have reviewed the triage vital signs and the nursing notes.  Pertinent labs & imaging results that were available during my care of the patient were reviewed by me and considered in my medical decision making (see chart for details).     Adverse effect of vaccine.  Symptoms are improving.  She has mild erythema and swelling to the left upper arm that has decreased in her opinion.  The lymphadenopathy has improved. Reassured this is most likely a reaction to the vaccine.  Nothing concerning at this time. Patient has very prominent epiglottis on exam.  Did not believe this is epiglottitis at this time. Not really sure what caused this "choking" episode Recommended follow-up with her primary care as scheduled. Apply ice to the area, Decadron given here today. Ibuprofen as needed.  Final Clinical Impressions(s) / UC Diagnoses   Final diagnoses:  Adverse effect of vaccine, initial encounter     Discharge Instructions      Believe this is reaction to the vaccine.  Apply some ice to the arm for inflammation and swelling.  Steroid injection given here. Ibuprofen as needed.  Follow up as needed for continued or worsening symptoms      ED Prescriptions    None     PDMP not reviewed this encounter.   Orvan July, NP 12/23/19 1044

## 2019-12-30 ENCOUNTER — Encounter: Payer: Self-pay | Admitting: Family Medicine

## 2019-12-30 ENCOUNTER — Other Ambulatory Visit: Payer: Self-pay

## 2019-12-30 ENCOUNTER — Ambulatory Visit (INDEPENDENT_AMBULATORY_CARE_PROVIDER_SITE_OTHER): Payer: BC Managed Care – PPO | Admitting: Family Medicine

## 2019-12-30 VITALS — BP 126/72 | HR 100 | Ht 65.0 in | Wt 338.0 lb

## 2019-12-30 DIAGNOSIS — T17308D Unspecified foreign body in larynx causing other injury, subsequent encounter: Secondary | ICD-10-CM

## 2019-12-30 DIAGNOSIS — E559 Vitamin D deficiency, unspecified: Secondary | ICD-10-CM | POA: Diagnosis not present

## 2019-12-30 DIAGNOSIS — N2889 Other specified disorders of kidney and ureter: Secondary | ICD-10-CM | POA: Diagnosis not present

## 2019-12-30 DIAGNOSIS — T17308A Unspecified foreign body in larynx causing other injury, initial encounter: Secondary | ICD-10-CM | POA: Insufficient documentation

## 2019-12-30 DIAGNOSIS — I10 Essential (primary) hypertension: Secondary | ICD-10-CM

## 2019-12-30 DIAGNOSIS — E785 Hyperlipidemia, unspecified: Secondary | ICD-10-CM | POA: Diagnosis not present

## 2019-12-30 NOTE — Progress Notes (Addendum)
    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 Lovena Neighbours Plan to watch it and will come back in a year  PERTINENT  PMH / PSH: HTN, OSA, chronic constipation, history of gastric banding, HLD, obesity, vitamin D deficiency  OBJECTIVE:   BP 126/72   Pulse 100   Ht 5\' 5"  (1.651 m)   Wt (!) 338 lb (153.3 kg)   SpO2 98%   BMI 56.25 kg/m    Physical Exam:  General: 54 y.o. female in NAD HEENT: Throat clear, no obvious swelling of pharynx Neck: No obvious swelling externally, no tenderness to palpation, no thyromegaly or cervical adenopathy palpated Cardio: RRR no m/r/g Lungs: CTAB, no wheezing, no rhonchi, no crackles, no IWOB on RA Skin: warm and dry Extremities: No edema   ASSESSMENT/PLAN:   Choking It is unclear what caused patient's choking episode, but this was a single event.  It is less likely that this was a vaccine reaction.  She reports a history of anaphylaxis but did not need epinephrine for  resolution, therefore this is much less likely.  She was given Decadron at urgent care.  Is most likely that she had a simple choking episode possibly from some mucus.  Advised patient to return to care immediately if this occurs again.  Will send Rx for epi pen to have for emergencies given history.  Patient can decide if she would like to see allergist.  HYPERTENSION, BENIGN SYSTEMIC Blood pressure is well controlled today.  We will keep her on hydrochlorothiazide 25 mg daily.  We will obtain a BMP today as well.  HLD (hyperlipidemia) Last lipid panel in October 2020, will obtain a lipid panel today.  She is not currently on a statin.  LDL was 153 at that time.  Will perform ASCVD risk when lipid panel returns.  Vitamin D deficiency She has completed 6 weeks therapy of 50,000 units weekly, now taking daily over-the-counter supplementation.  We will recheck vitamin D today.  Renal mass Following with Dr. Lovena Neighbours, urologist.  Patient will plan to follow-up with him in 1 year to continue to monitor.     Cleophas Dunker, [place]

## 2019-12-30 NOTE — Assessment & Plan Note (Signed)
Blood pressure is well controlled today.  We will keep her on hydrochlorothiazide 25 mg daily.  We will obtain a BMP today as well.

## 2019-12-30 NOTE — Assessment & Plan Note (Addendum)
It is unclear what caused patient's choking episode, but this was a single event.  It is less likely that this was a vaccine reaction.  She reports a history of anaphylaxis but did not need epinephrine for resolution, therefore this is much less likely.  She was given Decadron at urgent care.  Is most likely that she had a simple choking episode possibly from some mucus.  Advised patient to return to care immediately if this occurs again.  Will send Rx for epi pen to have for emergencies given history.  Patient can decide if she would like to see allergist.

## 2019-12-30 NOTE — Assessment & Plan Note (Signed)
Last lipid panel in October 2020, will obtain a lipid panel today.  She is not currently on a statin.  LDL was 153 at that time.  Will perform ASCVD risk when lipid panel returns.

## 2019-12-30 NOTE — Assessment & Plan Note (Signed)
Following with Dr. Lovena Neighbours, urologist.  Patient will plan to follow-up with him in 1 year to continue to monitor.

## 2019-12-30 NOTE — Patient Instructions (Signed)
Thank you for coming to see me today. It was a pleasure. Today we talked about:   We will get some labs today.  If they are abnormal or we need to do something about them, I will call you.  If they are normal, I will send you a message on MyChart (if it is active) or a letter in the mail.  If you don't hear from [place] in 2 weeks, please call the office at the number below.  If you have choking again, come to be seen right away.  Please follow-up with me in 3 months.  If you have any questions or concerns, please do not hesitate to call the office at [phone].  Best,   Arizona Constable, DO

## 2019-12-30 NOTE — Assessment & Plan Note (Signed)
She has completed 6 weeks therapy of 50,000 units weekly, now taking daily over-the-counter supplementation.  We will recheck vitamin D today.

## 2019-12-31 LAB — BASIC METABOLIC PANEL
BUN/Creatinine Ratio: 17 (ref 9–23)
BUN: 11 mg/dL (ref 6–24)
CO2: 24 mmol/L (ref 20–29)
Calcium: 9.8 mg/dL (ref 8.7–10.2)
Chloride: 101 mmol/L (ref 96–106)
Creatinine, Ser: 0.63 mg/dL (ref 0.57–1.00)
GFR calc Af Amer: 117 mL/min/{1.73_m2} (ref >59)
GFR calc non Af Amer: 101 mL/min/{1.73_m2} (ref >59)
Glucose: 100 mg/dL — ABNORMAL HIGH (ref 65–99)
Potassium: 4.1 mmol/L (ref 3.5–5.2)
Sodium: 139 mmol/L (ref 134–144)

## 2019-12-31 LAB — LIPID PANEL
Chol/HDL Ratio: 4.2 ratio (ref 0.0–4.4)
Cholesterol, Total: 272 mg/dL — ABNORMAL HIGH (ref 100–199)
HDL: 65 mg/dL (ref >39)
LDL Chol Calc (NIH): 161 mg/dL — ABNORMAL HIGH (ref 0–99)
Triglycerides: 252 mg/dL — ABNORMAL HIGH (ref 0–149)
VLDL Cholesterol Cal: 46 mg/dL — ABNORMAL HIGH (ref 5–40)

## 2019-12-31 LAB — VITAMIN D 25 HYDROXY (VIT D DEFICIENCY, FRACTURES): Vit D, 25-Hydroxy: 14.8 ng/mL — ABNORMAL LOW (ref 30.0–100.0)

## 2019-12-31 MED ORDER — EPINEPHRINE 0.3 MG/0.3ML IJ SOAJ: 0.3000 mg | INTRAMUSCULAR | 1 refills | Status: DC | PRN

## 2019-12-31 NOTE — Addendum Note (Signed)
Addended by: Cleophas Dunker on: 12/31/2019 08:41 AM   Modules accepted: Orders

## 2020-01-04 ENCOUNTER — Other Ambulatory Visit: Payer: Self-pay | Admitting: Family Medicine

## 2020-01-04 DIAGNOSIS — T17308D Unspecified foreign body in larynx causing other injury, subsequent encounter: Secondary | ICD-10-CM

## 2020-01-04 DIAGNOSIS — Z87892 Personal history of anaphylaxis: Secondary | ICD-10-CM

## 2020-01-11 ENCOUNTER — Telehealth: Payer: Self-pay | Admitting: Allergy

## 2020-01-11 NOTE — Telephone Encounter (Signed)
Called and left a voicemail for patient to give us a call back

## 2020-01-11 NOTE — Telephone Encounter (Signed)
Referral sent over after adverse effect of vaccine, per notes, "possible reaction to left upper arm and lymph node swelling.  This started 3 days after getting the fibroid Covid booster.". Please review chart to advise if component testing is needed.

## 2020-01-11 NOTE — Telephone Encounter (Signed)
From the information that we have so far it sounds like a local reaction, not a systemic reaction. Please gather more information. Please let a physician review once further information is received, because if appointment is necessary she would be coming in as a new patient since her last appointment was on 12/11/16.  Thank you, Altha Harm

## 2020-01-12 NOTE — Telephone Encounter (Signed)
Getting ready to get back in bed and had a coughing spell which led SOB (not be able to catch her breath as patient stated) on 12/20/2019. She received her COVID booster at on 12/19/2019. Which she believes it is not related to her COVID vaccine and may have caused her to have made her symptoms worse. She does complain of sinus issue that is being related to her symptoms. Patients is doing well, and is using loratadine for her allergies not using any rescue inhales.  Patient states she had seen Dr. Shaune Leeks in the past which was not related to her issues now and needs to be seen soon. She is recommending to be seen in the [place] office and prefers that we call on a Thursday or Friday afternoon.

## 2020-01-12 NOTE — Telephone Encounter (Signed)
Left a message for patient to call back. 

## 2020-01-12 NOTE — Telephone Encounter (Signed)
Please let a physician review since she would be coming in as a new patient. Thank you.  Althea Charon

## 2020-01-13 NOTE — Telephone Encounter (Signed)
She has seen Dr. Shaune Leeks in the past but has been over 3 years.  See note below.  Would like to come to [facility] office.  She will be a new pt.   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  Based on her symptoms following her 3rd Pfizer vaccine does not sound like component testing is necessary here.  She has had her 3rd booster dose thus she should not need any further covid vaccines any time soon. Whoever sees her can discuss her symptoms following the vaccine and determine if she needs any further testing at some point in the future but do not think she will need to have component testing at her new patient appointment.

## 2020-01-13 NOTE — Telephone Encounter (Signed)
Please review and thank you.

## 2020-01-27 ENCOUNTER — Encounter: Payer: Self-pay | Admitting: Dietician

## 2020-01-27 ENCOUNTER — Encounter: Payer: BC Managed Care – PPO | Attending: Surgery | Admitting: Dietician

## 2020-01-27 ENCOUNTER — Other Ambulatory Visit: Payer: Self-pay

## 2020-01-27 DIAGNOSIS — E669 Obesity, unspecified: Secondary | ICD-10-CM | POA: Diagnosis not present

## 2020-01-27 NOTE — Progress Notes (Signed)
Medical Nutrition Therapy   Primary concerns today: none reported   Referral diagnosis: E66.01- morbid obesity  Preferred learning style: no preference indicated Learning readiness: not ready   NUTRITION ASSESSMENT   Clinical Medical Hx: obesity, HTN, diverticulosis, acid reflux, chronic constipation, hyperlipidemia, anxiety, prediabetes, vitamin D deficiency, tachycardia, hx of lapband  Medications: see chart Notable Signs/Symptoms: none reported  Lifestyle & Dietary Hx Patient presented for today's appointment, stating "I was told by my doctor to come here." When asked, patient stated she has no questions or anything she would like to discuss today. H/o lapband, and patient states she has had more fluid removed recently, stating "it doesn't work."  When asked about what she eats, patient states "anything I want to." Typical meal pattern is 3 meals per day plus snacks. Patient states she doesn't really cook much, and states that prices of healthy food in grocery stores is too high. May have a pork chop biscuit from [employer], beanie weenies, ramen noodles, mashed potatoes, or hot dogs. May snack on popcorn, chips, donuts, yogurt, or cheese and crackers. Does like [employer] shakes, but doesn't drink them. States she gets burned out on foods easily, so she doesn't bother trying to eat healthier foods. Patient asked how many calories she should be eating per day, because she pays for an app to track her calorie intake.  States she does not exercise due to foot pain. States she had surgery on her foot 1.5 years ago and it still has not healed, so she can't exercise on it. However, states she may be willing to try out her treadmill 1-2 days per week.   Supplements: vitamin D Current average weekly physical activity: none (states her foot still hurts since having surgery 1.5 years ago)   24-Hr Dietary Recall First Meal: iced caramel coffee + sausage egg & cheese McGriddle  Snack: popcorn   Second Meal: ramen noodles  Snack: donut  Third Meal: ramen noodles  Snack: - Beverages: water, soda, [place] diet tea, diet cranberry juice   NUTRITION DIAGNOSIS  Excessive energy intake (NI-1.3) related to frequent use of high calorie snacks and restaurant foods as evidenced by patient reported dietary intake.    NUTRITION INTERVENTION  Nutrition education (E-1) on the following topics:  . General balanced diet to focus on portion control, healthy snack options, and balanced meals to include vegetables, complex carbs, and protein.   Handouts Provided Include   Bariatric MyPlate  Bariatric Snack Ideas   Learning Style & Readiness for Change Teaching method utilized: Visual & Auditory  Demonstrated degree of understanding via: Teach Back  Barriers to learning/adherence to lifestyle change: Obstinate to Change   Goals  Aim to use the treadmill 1-2 times per week.   Include balanced snacks throughout the day that provide fiber and/or protein.    MONITORING & EVALUATION Dietary intake, weekly physical activity, and goals in 2 months.  Next Steps  Patient is to return to [facility] for followup visit in about 2 months.

## 2020-01-27 NOTE — Patient Instructions (Signed)
   Aim to use the treadmill 1-2 times per week.   Include balanced snacks throughout the day that provide fiber and/or protein.

## 2020-02-04 ENCOUNTER — Other Ambulatory Visit: Payer: Self-pay | Admitting: Sports Medicine

## 2020-02-04 ENCOUNTER — Ambulatory Visit (INDEPENDENT_AMBULATORY_CARE_PROVIDER_SITE_OTHER): Payer: BC Managed Care – PPO

## 2020-02-04 ENCOUNTER — Other Ambulatory Visit: Payer: Self-pay

## 2020-02-04 ENCOUNTER — Ambulatory Visit: Payer: BC Managed Care – PPO | Admitting: Allergy and Immunology

## 2020-02-04 ENCOUNTER — Ambulatory Visit: Payer: BC Managed Care – PPO | Admitting: Sports Medicine

## 2020-02-04 ENCOUNTER — Encounter: Payer: Self-pay | Admitting: Sports Medicine

## 2020-02-04 DIAGNOSIS — G5792 Unspecified mononeuropathy of left lower limb: Secondary | ICD-10-CM

## 2020-02-04 DIAGNOSIS — M779 Enthesopathy, unspecified: Secondary | ICD-10-CM | POA: Diagnosis not present

## 2020-02-04 DIAGNOSIS — M7989 Other specified soft tissue disorders: Secondary | ICD-10-CM | POA: Diagnosis not present

## 2020-02-04 DIAGNOSIS — M79672 Pain in left foot: Secondary | ICD-10-CM

## 2020-02-04 DIAGNOSIS — M792 Neuralgia and neuritis, unspecified: Secondary | ICD-10-CM

## 2020-02-04 DIAGNOSIS — G8929 Other chronic pain: Secondary | ICD-10-CM

## 2020-02-04 MED ORDER — GABAPENTIN 300 MG PO CAPS: 300.0000 mg | ORAL_CAPSULE | Freq: Every day | ORAL | 3 refills | Status: DC

## 2020-02-04 NOTE — Progress Notes (Signed)
Subjective: [name] is a 54 y.o. female patient seen today in office for POV #5 (DOS 08-18-18), S/P Left 5th hammertoe repair and EPF w/ Heel spur reduction. Patient reports that she still has pain and swelling to left foot can't wear a shoe and numbness to 5th toe and pain to lateral ankle. Reports that she went back to wearing her surgical shoe that helps. Denies injury, denies calf pain, denies headache, chest pain, shortness of breath, nausea, vomiting, fever, or chills. No other issues noted.   Patient Active Problem List   Diagnosis Date Noted  . Choking 12/30/2019  . 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 Concern about urinary tract disease without diagnosis 10/21/2019  . Tachycardia 10/05/2019  . Osteoarthritis of left knee 05/01/2018  . Baker's cyst of knee, left 05/01/2018  . Left leg pain 04/30/2018  . Vitamin D deficiency 08/30/2017  . HLD (hyperlipidemia) 06/26/2017  . Lower back pain 06/01/2017  . Pre-diabetes 04/23/2017  . Other allergic rhinitis 12/12/2016  . 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 Toxic effect of venom of ants, unintentional 12/12/2016  . Exercise-induced bronchospasm 12/12/2016  . Acid reflux 04/18/2015  . 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 Anxiety 11/24/2010  . Chronic constipation 09/06/2009  . POSTMENOPAUSAL ATROPHIC VAGINITIS 05/24/2009  . EXTERNAL HEMORRHOIDS 10/08/2007  . DIVERTICULOSIS, COLON 10/08/2007  . Obesity 05/02/2006  . HYPERTENSION, BENIGN SYSTEMIC 05/02/2006    Current Outpatient Medications on File Prior to Visit  Medication Sig Dispense Refill  . EPINEPHrine 0.3 mg/0.3 mL IJ SOAJ injection Inject 0.3 mg into the muscle as needed for anaphylaxis. 1 each 1  . hydrochlorothiazide (HYDRODIURIL) 25 MG tablet take 25mg  by mouth once daily 90 tablet 2  . Vitamin D, Ergocalciferol, (DRISDOL) 1.25 MG (50000 UNIT) CAPS capsule  Take 1 capsule (50,000 Units total) by mouth every 7 (seven) days. 6 capsule 0   No current facility-administered medications on file prior to visit.    Allergies  Allergen Reactions  . Other     general anesthesia - upsets stomach   . Penicillins Hives and Itching    Has patient had a PCN reaction causing immediate rash, facial/tongue/throat swelling, SOB or lightheadedness with hypotension: Yes Has patient had a PCN reaction causing severe rash involving mucus membranes or skin necrosis: Yes Has patient had a PCN reaction that required hospitalization: No Has patient had a PCN reaction occurring within the last 10 years: No If all of the above answers are "NO", then may proceed with Cephalosporin use.     Objective: There were no vitals filed for this visit.  General: No acute distress, AAOx3  Left foot: Incisions healing well at surgical sites, mild swelling to left, no erythema, no warmth, no drainage, no signs of infection noted, Capillary fill time <3 seconds in all digits, gross sensation present via light touch to left foot. No pain or crepitation with range of motion left foot. Pain to 5th toe and peroneal tendon course that extends to the toe. No pain with calf compression.   Xrays No acute findings, consistent with post op status no acute findings unchanged heel spurs.   Assessment and Plan:  Problem List Items Addressed This Visit    None    Visit Diagnoses    Pain in left foot    -  Primary   Relevant Orders   MR FOOT LEFT WO CONTRAST   Chronic  pain in left foot       Relevant Medications   gabapentin (NEURONTIN) 300 MG capsule   Other Relevant Orders   MR FOOT LEFT WO CONTRAST   Neuritis       Relevant Orders   MR FOOT LEFT WO CONTRAST   Swelling of left foot       Relevant Orders   MR FOOT LEFT WO CONTRAST   Tendonitis          -Patient seen and evaluated -Xrays reviewed -Discuss with patient chronic pain, swelling and neuritis with possible  tendonitis vs tear -Rx MRI for further eval of chronic foot pain -advised patient to continue with Compression stockings -Advised patient to continue with surgical shoe or good supportive shoes as tolerated -Rx Gabapentin for neuritis  -Return after MRI.  In the meantime, patient to call office if any issues or problems arise.   Landis Martins, DPM

## 2020-02-18 ENCOUNTER — Encounter: Payer: Self-pay | Admitting: Sports Medicine

## 2020-03-02 ENCOUNTER — Other Ambulatory Visit: Payer: Self-pay

## 2020-03-02 ENCOUNTER — Encounter: Payer: Self-pay | Admitting: Allergy and Immunology

## 2020-03-02 ENCOUNTER — Ambulatory Visit: Payer: BC Managed Care – PPO | Admitting: Allergy and Immunology

## 2020-03-02 VITALS — BP 130/74 | HR 107 | Temp 98.1°F | Resp 12 | Ht 64.5 in | Wt 346.8 lb

## 2020-03-02 DIAGNOSIS — R059 Cough, unspecified: Secondary | ICD-10-CM | POA: Diagnosis not present

## 2020-03-02 DIAGNOSIS — J3089 Other allergic rhinitis: Secondary | ICD-10-CM | POA: Diagnosis not present

## 2020-03-02 MED ORDER — AZELASTINE HCL 0.1 % NA SOLN: NASAL | 5 refills | Status: DC

## 2020-03-02 NOTE — Progress Notes (Signed)
Follow-up office note  RE: Lynn LangSusan Marie Borgmeyer MRN: [id number] DOB: 12/31/1965 Date of Office Visit: 03/02/2020   History of present illness: [name] is a 54 y.o. female presents today for follow-up.  She was last seen in this clinic by Dr. Beaulah DinningBardelas in late 2018. She reports that over the past few/several months she has been experiencing thick postnasal drainage.  The thick postnasal drainage causes a "tickling" sensation in the base of her throat which leads to coughing fits.  At times, she coughs so hard that it is difficult for her to catch her breath.  In fact, on one occasion October 2021 during a coughing fit she began to choke on thick mucus and her relatives almost called 911, however the mucus dislodged before they could do so.  She does not experience dyspnea in the absence of a coughing fit.  She denies symptoms consistent with GERD.  She underwent allergy skin testing in late 2018 revealing mild/borderline reactivity to grass pollen, mold, and cockroach antigen.  She does not experience significant nasal congestion, sneezing, rhinorrhea, nasal pruritus, and ocular pruritus.  She does experience copious postnasal drainage and occasional sinus pressure over the forehead.  Assessment and plan: Coughing The patient's history and physical examination suggest upper airway cough syndrome.  Spirometry today reveals normal ventilatory function. We will aggressively treat postnasal drainage and evaluate results.  Treatment plan as outlined below.  If the coughing persists or progresses despite this plan, we will evaluate further.  Allergic rhinitis with nonallergic component Skin testing 2018 revealed mild/borderline reactivity to grass, mold, and cockroach antigen.  A prescription has been provided for azelastine nasal spray, 1-2 sprays per nostril 2 times daily as needed. Proper nasal spray technique has been discussed and demonstrated.   Nasal saline lavage (NeilMed) has  been recommended as needed and prior to medicated nasal sprays along with instructions for proper administration.  For thick post nasal drainage, add guaifenesin 1200 mg (Mucinex Maximum Strength)  twice daily as needed with adequate hydration as discussed.   Meds ordered this encounter  Medications  . azelastine (ASTELIN) 0.1 % nasal spray    Sig: 1-2 sprays per nostril 2 times a daily as needed    Dispense:  30 mL    Refill:  5    Diagnostics: Spirometry: FVC was 2.49 L (87% predicted) and FEV1 was 1.89 L (83% predicted) with an FEV1 ratio of 95%.  There was no postbronchodilator improvement.  Mild restriction without reversibility.  The restrictive pattern may be due to the patient's body habitus.  Please see scanned spirometry results for details.    Physical examination: Blood pressure 130/74, pulse (!) 107, temperature 98.1 F (36.7 C), temperature source Tympanic, resp. rate 12, height 5' 4.5" (1.638 m), weight (!) 346 lb 12.5 oz (157.3 kg), SpO2 99 %.  General: Alert, interactive, in no acute distress. HEENT: TMs pearly gray, turbinates mildly edematous without discharge, post-pharynx moderately erythematous. Neck: Supple without lymphadenopathy. Lungs: Clear to auscultation without wheezing, rhonchi or rales. CV: Normal S1, S2 without murmurs. Abdomen: Nondistended, nontender. Skin: Warm and dry, without lesions or rashes. Extremities:  No clubbing, cyanosis or edema. Neuro:   Grossly intact.  Review of systems:  Review of systems negative except as noted in HPI / PMHx or noted below: Review of Systems  Constitutional: Negative.   HENT: Negative.   Eyes: Negative.   Respiratory: Negative.   Cardiovascular: Negative.   Gastrointestinal: Negative.   Genitourinary: Negative.   Musculoskeletal:  Negative.   Skin: Negative.   Neurological: Negative.   Endo/Heme/Allergies: Negative.   Psychiatric/Behavioral: Negative.     Past medical history:  Past Medical History:   Diagnosis Date  . 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 Complication of anesthesia    UPSETS STOMACH  . Hyperlipidemia   . Hypertension   . IBS (irritable bowel syndrome)   . Morbid obesity (HCC)   . PONV (postoperative nausea and vomiting)   . Sinus complaint   . Urticaria   . Wears glasses     Past surgical history:  Past Surgical History:  Procedure Laterality Date  . ABDOMINAL HYSTERECTOMY     COMPLETE  . CHOLECYSTECTOMY  1990  . COLONOSCOPY WITH PROPOFOL N/A 08/22/2016   Procedure: COLONOSCOPY WITH PROPOFOL;  Surgeon: Willis Modena, MD;  Location: [facility];  Service: Endoscopy;  Laterality: N/A;  . ESOPHAGEAL MANOMETRY N/A 07/06/2013   Procedure: ESOPHAGEAL MANOMETRY (EM);  Surgeon: Willis Modena, MD;  Location: [facility];  Service: Endoscopy;  Laterality: N/A;  . GASTRIC RESTRICTION SURGERY  2010   lap band  . HEMORROIDECTOMY      Family history: Family History  Problem Relation Age of Onset  . Thyroid cancer Mother   . Hypertension Mother   . Hyperlipidemia Mother   . Allergic rhinitis Brother   . Angioedema Brother   . Breast cancer Other        aunts  . Breast cancer Maternal Aunt   . Breast cancer Maternal Aunt   . Asthma Sister   . Eczema Grandson   . Immunodeficiency Neg Hx   . Urticaria Neg Hx     Social history: Social History   Socioeconomic History  . Marital status: Single    Spouse name: Not on file  . Number of children: Not on file  . Years of education: Not on file  . Highest education level: Not on file  Occupational History  . Not on file  Tobacco Use  . Smoking status: Never Smoker  . Smokeless tobacco: Never Used  Vaping Use  . Vaping Use: Never used  Substance and Sexual Activity  . Alcohol use: No  . Drug use: No  . Sexual activity: Yes    Birth control/protection: Surgical  Other Topics Concern  . Not on file  Social History Narrative  . Not on file   Social Determinants of Health   Financial  Resource Strain: Not on file  Food Insecurity: No Food Insecurity  . Worried About Programme researcher, broadcasting/film/video in the Last Year: Never true  . Ran Out of Food in the Last Year: Never true  Transportation Needs: Not on file  Physical Activity: Not on file  Stress: Not on file  Social Connections: Not on file  Intimate Partner Violence: Not on file    Environmental History: The patient lives in a house [place] with hardwood floors throughout, gas heat, and central air.  There is no known mold/water damage in the home.  There are no pets in the home.  She is a non-smoker.  Current Outpatient Medications  Medication Sig Dispense Refill  . azelastine (ASTELIN) 0.1 % nasal spray 1-2 sprays per nostril 2 times a daily as needed 30 mL 5  . Cholecalciferol (VITAMIN D3) 125 MCG (5000 UT) CAPS Take by mouth daily.    Marland Kitchen EPINEPHrine 0.3 mg/0.3 mL IJ SOAJ injection Inject 0.3 mg into the muscle as needed for anaphylaxis. 1 each 1  .  gabapentin (NEURONTIN) 300 MG capsule Take 1 capsule (300 mg total) by mouth at bedtime. 90 capsule 3  . hydrochlorothiazide (HYDRODIURIL) 25 MG tablet take 25mg  by mouth once daily 90 tablet 2  . loratadine (CLARITIN) 10 MG tablet Take 10 mg by mouth daily as needed for allergies.     No current facility-administered medications for this visit.    Known medication allergies: Allergies  Allergen Reactions  . Other     general anesthesia - upsets stomach   . Penicillins Hives and Itching    Has patient had a PCN reaction causing immediate rash, facial/tongue/throat swelling, SOB or lightheadedness with hypotension: Yes Has patient had a PCN reaction causing severe rash involving mucus membranes or skin necrosis: Yes Has patient had a PCN reaction that required hospitalization: No Has patient had a PCN reaction occurring within the last 10 years: No If all of the above answers are "NO", then may proceed with Cephalosporin use.     I appreciate the opportunity to take  part in [place]'s care. Please do not hesitate to contact me with questions.  Sincerely,   R. Edgar Frisk, MD

## 2020-03-02 NOTE — Assessment & Plan Note (Addendum)
Skin testing 2018 revealed mild/borderline reactivity to grass, mold, and cockroach antigen.  A prescription has been provided for azelastine nasal spray, 1-2 sprays per nostril 2 times daily as needed. Proper nasal spray technique has been discussed and demonstrated.   Nasal saline lavage (NeilMed) has been recommended as needed and prior to medicated nasal sprays along with instructions for proper administration.  For thick post nasal drainage, add guaifenesin 1200 mg (Mucinex Maximum Strength)  twice daily as needed with adequate hydration as discussed.

## 2020-03-02 NOTE — Assessment & Plan Note (Signed)
The patient's history and physical examination suggest upper airway cough syndrome.  Spirometry today reveals normal ventilatory function. We will aggressively treat postnasal drainage and evaluate results.  Treatment plan as outlined below.  If the coughing persists or progresses despite this plan, we will evaluate further. 

## 2020-03-02 NOTE — Patient Instructions (Addendum)
Coughing The patient's history and physical examination suggest upper airway cough syndrome.  Spirometry today reveals normal ventilatory function. We will aggressively treat postnasal drainage and evaluate results.  Treatment plan as outlined below.  If the coughing persists or progresses despite this plan, we will evaluate further.  Allergic rhinitis with nonallergic component Skin testing 2018 revealed mild/borderline reactivity to grass, mold, and cockroach antigen.  A prescription has been provided for azelastine nasal spray, 1-2 sprays per nostril 2 times daily as needed. Proper nasal spray technique has been discussed and demonstrated.   Nasal saline lavage (NeilMed) has been recommended as needed and prior to medicated nasal sprays along with instructions for proper administration.  For thick post nasal drainage, add guaifenesin 1200 mg (Mucinex Maximum Strength)  twice daily as needed with adequate hydration as discussed.   Return in about 2 months (around 05/02/2020), or if symptoms worsen or fail to improve.

## 2020-03-12 ENCOUNTER — Other Ambulatory Visit: Payer: Self-pay

## 2020-03-12 ENCOUNTER — Ambulatory Visit
Admission: RE | Admit: 2020-03-12 | Discharge: 2020-03-12 | Disposition: A | Discharge status: Home / Self Care | Payer: BC Managed Care – PPO | Source: Ambulatory Visit | Attending: Sports Medicine | Admitting: Sports Medicine

## 2020-03-12 DIAGNOSIS — M792 Neuralgia and neuritis, unspecified: Secondary | ICD-10-CM

## 2020-03-12 DIAGNOSIS — G8929 Other chronic pain: Secondary | ICD-10-CM

## 2020-03-12 DIAGNOSIS — M7989 Other specified soft tissue disorders: Secondary | ICD-10-CM

## 2020-03-12 DIAGNOSIS — M79672 Pain in left foot: Secondary | ICD-10-CM

## 2020-03-22 ENCOUNTER — Ambulatory Visit: Payer: BC Managed Care – PPO | Admitting: Dietician

## 2020-05-04 ENCOUNTER — Encounter: Payer: Self-pay | Admitting: Allergy

## 2020-05-04 ENCOUNTER — Ambulatory Visit (INDEPENDENT_AMBULATORY_CARE_PROVIDER_SITE_OTHER): Payer: BC Managed Care – PPO | Admitting: Allergy

## 2020-05-04 ENCOUNTER — Other Ambulatory Visit: Payer: Self-pay

## 2020-05-04 VITALS — BP 142/82 | HR 90 | Resp 16

## 2020-05-04 DIAGNOSIS — R059 Cough, unspecified: Secondary | ICD-10-CM | POA: Diagnosis not present

## 2020-05-04 DIAGNOSIS — J3089 Other allergic rhinitis: Secondary | ICD-10-CM

## 2020-05-04 MED ORDER — IPRATROPIUM BROMIDE 0.06 % NA SOLN: NASAL | 5 refills | Status: AC

## 2020-05-04 MED ORDER — ALBUTEROL SULFATE HFA 108 (90 BASE) MCG/ACT IN AERS: INHALATION_SPRAY | RESPIRATORY_TRACT | 2 refills | Status: DC

## 2020-05-04 NOTE — Patient Instructions (Addendum)
Coughing Most likely secondary to upper airway cough syndrome.   Previous spirometry has shown normal ventilatory function.    have access to albuterol inhaler 2 puffs every 4-6 hours as needed for cough/wheeze/shortness of breath/chest tightness.  May use 15-20 minutes prior to activity.   Monitor frequency of use and if it helps with cough cessation   Allergic rhinitis with nonallergic component Skin testing 2018 revealed mild/borderline reactivity to grass, mold, and cockroach antigen.  Stop Azelastine as not effective  Start nasal Atrovent 2 sprays each nostril 1-2 times a day (can use up to 3-4 times a day if needed for nasal drainage control)  Stop Loratadine as not effective  Start either Xyzal, Allegra or Zyrtec 1 tab daily as needed.  These are all OTC long-acting antihsitamines similar to Loratadine that may be more effective  Nasal saline lavage (NeilMed) has been recommended as needed and prior to medicated nasal sprays along with instructions for proper administration.  Consider adding in Singulair if above regimens are not effective enough in controlling symptoms    Follow-up in 3 months or sooner if needed

## 2020-05-04 NOTE — Progress Notes (Signed)
Follow-up Note  RE: [name] MRN: [id number] DOB: 22-Dec-1965 Date of Office Visit: 05/04/2020   History of present illness: [name] is a 55 y.o. female presenting today for follow-up of cough, allergic rhinitis with nonallergic component.  She was last seen in the office on 03/02/20 by Dr. Verlin Fester.    She states she had a coughing spell at work today where she felt like she could not breathe.  She states she felt like she was choking.  She had another coughing spell about a week ago.  She states she has been using the azelastine nasal spray.  Sometimes will do 1-2 sprays twice a day.  She does not feel like it is helping.  She still notes drainage in the back of her throat but she cannot seem to clear.  She states this is happening all the time where she feels like there is mucus in the back of her throat.  She states she does not like performing nasal saline rinses.  She takes loratadine as needed not daily.    Review of systems: Review of Systems  Constitutional: Negative.   HENT:       See HPI  Eyes: Negative.   Respiratory:       See HPI  Cardiovascular: Negative.   Gastrointestinal: Negative.   Musculoskeletal: Negative.   Skin: Negative.   Endo/Heme/Allergies: Negative.     All other systems negative unless noted above in HPI  Past medical/social/surgical/family history have been reviewed and are unchanged unless specifically indicated below.  No changes  Medication List: Current Outpatient Medications  Medication Sig Dispense Refill  . albuterol (VENTOLIN HFA) 108 (90 Base) MCG/ACT inhaler 2 puffs every 4-6 hours as needed for cough/wheeze/shortness of breath.chest tightness 18 g 2  . Cholecalciferol (VITAMIN D3) 125 MCG (5000 UT) CAPS Take by mouth daily.    Marland Kitchen EPINEPHrine 0.3 mg/0.3 mL IJ SOAJ injection Inject 0.3 mg into the muscle as needed for anaphylaxis. 1 each 1  . hydrochlorothiazide (HYDRODIURIL) 25 MG tablet take 25mg  by mouth once  daily 90 tablet 2  . ipratropium (ATROVENT) 0.06 % nasal spray 2 spray each nostril 1-2 times daily. 15 mL 5  . loratadine (CLARITIN) 10 MG tablet Take 10 mg by mouth daily as needed for allergies.     No current facility-administered medications for this visit.     Known medication allergies: Allergies  Allergen Reactions  . Other     general anesthesia - upsets stomach   . Penicillins Hives and Itching    Has patient had a PCN reaction causing immediate rash, facial/tongue/throat swelling, SOB or lightheadedness with hypotension: Yes Has patient had a PCN reaction causing severe rash involving mucus membranes or skin necrosis: Yes Has patient had a PCN reaction that required hospitalization: No Has patient had a PCN reaction occurring within the last 10 years: No If all of the above answers are "NO", then may proceed with Cephalosporin use.      Physical examination: Blood pressure (!) 142/82, pulse 90, resp. rate 16, SpO2 97 %.  General: Alert, interactive, in no acute distress. HEENT: PERRLA, TMs pearly gray, turbinates minimally edematous without discharge, post-pharynx non erythematous with mild cobblestoning. Neck: Supple without lymphadenopathy. Lungs: Clear to auscultation without wheezing, rhonchi or rales. {no increased work of breathing. CV: Normal S1, S2 without murmurs. Abdomen: Nondistended, nontender. Skin: Warm and dry, without lesions or rashes. Extremities:  No clubbing, cyanosis or edema. Neuro:   Grossly intact.  Diagnositics/Labs: None today  Assessment and plan:     Allergic rhinitis with nonallergic component Skin testing 2018 revealed mild/borderline reactivity to grass, mold, and cockroach antigen.  Stop Azelastine as not effective  Start nasal Atrovent 2 sprays each nostril 1-2 times a day (can use up to 3-4 times a day if needed for nasal drainage control)  Stop Loratadine as not effective  Start either Xyzal, Allegra or Zyrtec 1 tab  daily as needed.  These are all OTC long-acting antihsitamines similar to Loratadine that may be more effective  Nasal saline lavage (NeilMed) has been recommended as needed and prior to medicated nasal sprays along with instructions for proper administration.  Consider adding in Singulair if above regimens are not effective enough in controlling symptoms  Coughing Most likely secondary to upper airway cough syndrome.   Previous spirometry has shown normal ventilatory function.    have access to albuterol inhaler 2 puffs every 4-6 hours as needed for cough/wheeze/shortness of breath/chest tightness.  May use 15-20 minutes prior to activity.   Monitor frequency of use and if it helps with cough cessation   Follow-up in 3 months or sooner if needed  I appreciate the opportunity to take part in [name]'s care. Please do not hesitate to contact me with questions.  Sincerely,   Prudy Feeler, MD Allergy/Immunology [facility]

## 2020-05-11 ENCOUNTER — Other Ambulatory Visit: Payer: Self-pay

## 2020-05-11 ENCOUNTER — Ambulatory Visit (HOSPITAL_COMMUNITY)
Admission: RE | Admit: 2020-05-11 | Discharge: 2020-05-11 | Disposition: A | Discharge status: Home / Self Care | Payer: BC Managed Care – PPO | Source: Ambulatory Visit | Attending: Family Medicine | Admitting: Family Medicine

## 2020-05-11 ENCOUNTER — Encounter: Payer: Self-pay | Admitting: Family Medicine

## 2020-05-11 ENCOUNTER — Ambulatory Visit (INDEPENDENT_AMBULATORY_CARE_PROVIDER_SITE_OTHER): Payer: BC Managed Care – PPO | Admitting: Family Medicine

## 2020-05-11 VITALS — BP 126/70 | HR 90 | Ht 64.0 in | Wt 350.2 lb

## 2020-05-11 DIAGNOSIS — E559 Vitamin D deficiency, unspecified: Secondary | ICD-10-CM | POA: Diagnosis not present

## 2020-05-11 DIAGNOSIS — R000 Tachycardia, unspecified: Secondary | ICD-10-CM | POA: Insufficient documentation

## 2020-05-11 DIAGNOSIS — I10 Essential (primary) hypertension: Secondary | ICD-10-CM | POA: Insufficient documentation

## 2020-05-11 DIAGNOSIS — E785 Hyperlipidemia, unspecified: Secondary | ICD-10-CM

## 2020-05-11 NOTE — Assessment & Plan Note (Signed)
ASCVD risk 5.3 on check in October.  Not currently on statin.  Continue with current management.  Obtain lipid panel in October 2022.

## 2020-05-11 NOTE — Assessment & Plan Note (Addendum)
States he is very situational and related to stress.  Her heart rate is within normal limits today.  Looking back, her heart rates do not appear to be very concerning in chart review.  EKG today NSR.  Will obtain BMP, CBC, TSH to rule out other abnormalities, but suspect this is most likely related to stress.  Aside from issues with breathing for which she sees an allergist and is currently being treated, shortness of breath does not seem to be an issue and she denies any symptoms of orthopnea.  Heart failure is very unlikely.  Wells score of 0, PE unlikely.  Discussed speaking with the therapist for further coping strategies and recommendations.  She will consider this.  She will follow-up in 3 months or sooner as needed.

## 2020-05-11 NOTE — Assessment & Plan Note (Signed)
Currently on 5000 units daily, can continue with this for now.  We will follow up her vitamin D levels today.

## 2020-05-11 NOTE — Patient Instructions (Signed)
Thank you for coming to see me today. It was a pleasure. Today we talked about:   Look into psychology today for a therapist.  We will get some labs today.  If they are abnormal or we need to do something about them, I will call you.  If they are normal, I will send you a message on MyChart (if it is active) or a letter in the mail.  If you don't hear from [place] in 2 weeks, please call the office at the number below.  Please follow-up with me in 3 months.  If you have any questions or concerns, please do not hesitate to call the office at [phone].  Best,   Lynn Constable, DO  Specific Provider options [facility]  [email or web address] 1. click on find a therapist  2. enter your zip code 3. left side and select or tailor a therapist for your specific need.

## 2020-05-11 NOTE — Progress Notes (Signed)
    SUBJECTIVE:   CHIEF COMPLAINT / HPI:   Tachycardia When stress gets high at work Highest she has seen was 137 Always happens when she is stressed Doesn't necessarily happen when she is walking When she is not being upset at work, she is fine She has a smart watch that notifies her when her heart rate goes up On chart review, HR ranges 76-110, 110 was in August 2021 She can bring her heart rate down by calming herself down Stress level is always fine when she isn't at work Chest tightens when she gets stressed Can lay down at night okay, no large increase in weight  HTN Current regimen: HCTZ 25 mg daily Last BMP 12/30/2019, creatinine and electrolytes stable Hasn't been checking Bps at home Denies chest pain, shortness of breath  Hyperlipidemia Last lipid panel 12-30-2019 She is not currently on any statin therapy Her ASCVD risk was 5.3%  Vit D deficiency Takes Vit D 5000 units daily  PERTINENT  PMH / PSH: HTN, HLD, vitamin D deficiency, OSA, chronic constipation  OBJECTIVE:   BP 126/70 Comment: Left arm auto machine  Pulse 90   Ht 5\' 4"  (1.626 m)   Wt (!) 350 lb 4 oz (158.9 kg)   SpO2 98%   BMI 60.12 kg/m    Physical Exam:  General: 55 y.o. female in NAD Cardio: RRR no m/r/g Lungs: CTAB, no wheezing, no rhonchi, no crackles, no IWOB on RA Skin: warm and dry Extremities: No edema  EKG: NSR  ASSESSMENT/PLAN:   Tachycardia States he is very situational and related to stress.  Her heart rate is within normal limits today.  Looking back, her heart rates do not appear to be very concerning in chart review.  EKG today NSR.  Will obtain BMP, CBC, TSH to rule out other abnormalities, but suspect this is most likely related to stress.  Aside from issues with breathing for which she sees an allergist and is currently being treated, shortness of breath does not seem to be an issue and she denies any symptoms of orthopnea.  Heart failure is very unlikely.  Wells score  of 0, PE unlikely.  Discussed speaking with the therapist for further coping strategies and recommendations.  She will consider this.  She will follow-up in 3 months or sooner as needed.  Vitamin D deficiency Currently on 5000 units daily, can continue with this for now.  We will follow up her vitamin D levels today.  HLD (hyperlipidemia) ASCVD risk 5.3 on check in October.  Not currently on statin.  Continue with current management.  Obtain lipid panel in October 2022.  HYPERTENSION, BENIGN SYSTEMIC BP was appropriate on recheck today.  Obtaining a BMP per above.  Continue with her current regimen of hydrochlorothiazide 25 mg daily.  Follow-up in 3 months.     Cleophas Dunker, [place]

## 2020-05-11 NOTE — Assessment & Plan Note (Signed)
BP was appropriate on recheck today.  Obtaining a BMP per above.  Continue with her current regimen of hydrochlorothiazide 25 mg daily.  Follow-up in 3 months.

## 2020-05-12 LAB — BASIC METABOLIC PANEL
BUN/Creatinine Ratio: 17 (ref 9–23)
BUN: 9 mg/dL (ref 6–24)
CO2: 23 mmol/L (ref 20–29)
Calcium: 9.1 mg/dL (ref 8.7–10.2)
Chloride: 104 mmol/L (ref 96–106)
Creatinine, Ser: 0.53 mg/dL — ABNORMAL LOW (ref 0.57–1.00)
Glucose: 93 mg/dL (ref 65–99)
Potassium: 3.9 mmol/L (ref 3.5–5.2)
Sodium: 142 mmol/L (ref 134–144)
eGFR: 108 mL/min/{1.73_m2} (ref >59)

## 2020-05-12 LAB — CBC WITH DIFFERENTIAL/PLATELET
Basophils Absolute: 0 10*3/uL (ref 0.0–0.2)
Basos: 0 %
EOS (ABSOLUTE): 0.1 10*3/uL (ref 0.0–0.4)
Eos: 2 %
Hematocrit: 37.5 % (ref 34.0–46.6)
Hemoglobin: 12.5 g/dL (ref 11.1–15.9)
Immature Grans (Abs): 0 10*3/uL (ref 0.0–0.1)
Immature Granulocytes: 0 %
Lymphocytes Absolute: 3 10*3/uL (ref 0.7–3.1)
Lymphs: 36 %
MCH: 28.3 pg (ref 26.6–33.0)
MCHC: 33.3 g/dL (ref 31.5–35.7)
MCV: 85 fL (ref 79–97)
Monocytes Absolute: 0.5 10*3/uL (ref 0.1–0.9)
Monocytes: 6 %
Neutrophils Absolute: 4.6 10*3/uL (ref 1.4–7.0)
Neutrophils: 56 %
Platelets: 339 10*3/uL (ref 150–450)
RBC: 4.42 x10E6/uL (ref 3.77–5.28)
RDW: 13.2 % (ref 11.7–15.4)
WBC: 8.3 10*3/uL (ref 3.4–10.8)

## 2020-05-12 LAB — VITAMIN D 25 HYDROXY (VIT D DEFICIENCY, FRACTURES): Vit D, 25-Hydroxy: 26.9 ng/mL — ABNORMAL LOW (ref 30.0–100.0)

## 2020-05-12 LAB — TSH: TSH: 2.07 u[IU]/mL (ref 0.450–4.500)

## 2020-05-14 ENCOUNTER — Encounter: Payer: Self-pay | Admitting: Family Medicine

## 2020-05-17 ENCOUNTER — Other Ambulatory Visit: Payer: Self-pay | Admitting: Family Medicine

## 2020-05-17 DIAGNOSIS — F430 Acute stress reaction: Secondary | ICD-10-CM

## 2020-05-17 DIAGNOSIS — F411 Generalized anxiety disorder: Secondary | ICD-10-CM

## 2020-05-17 NOTE — Progress Notes (Signed)
Patient requesting psychiatry referral for anxiety and stress reaction

## 2020-07-22 ENCOUNTER — Telehealth: Payer: Self-pay | Admitting: Family Medicine

## 2020-07-22 NOTE — Telephone Encounter (Signed)
**  After Hours/ Emergency Line Call**  Received a call to report that [name] injected Saxenda into skin this morning, needle was not all way and noticed a bleb under her skin after.  Denying any pain, erythema at site.  Recommended that she use a different site for next injection.  Reassured that medication will absorb in the next 24 hrs.  Red flags discussed.  Will forward to PCP.  Carollee Leitz MD PGY-2, [facility] 07/22/2020 7:36 AM

## 2020-08-08 ENCOUNTER — Ambulatory Visit: Payer: BC Managed Care – PPO | Admitting: Allergy

## 2020-08-08 ENCOUNTER — Other Ambulatory Visit: Payer: Self-pay

## 2020-08-08 ENCOUNTER — Encounter: Payer: Self-pay | Admitting: Allergy

## 2020-08-08 VITALS — BP 132/84 | HR 90 | Temp 98.0°F | Resp 22 | Ht 63.75 in | Wt 319.2 lb

## 2020-08-08 DIAGNOSIS — J3089 Other allergic rhinitis: Secondary | ICD-10-CM

## 2020-08-08 DIAGNOSIS — R058 Other specified cough: Secondary | ICD-10-CM | POA: Diagnosis not present

## 2020-08-08 NOTE — Patient Instructions (Addendum)
Coughing Most likely secondary to upper airway cough syndrome.   Spirometry today shows normal ventilatory function.    have access to albuterol inhaler 2 puffs every 4-6 hours as needed for cough/wheeze/shortness of breath/chest tightness.  May use 15-20 minutes prior to activity.   Monitor frequency of use and if it helps with cough cessation   Allergic rhinitis with nonallergic component Skin testing 2018 revealed mild/borderline reactivity to grass, mold, and cockroach antigen.  Continue allergen avoidance measures  Continue nasal Atrovent 2 sprays each nostril 1-2 times a day (can use up to 3-4 times a day if needed for nasal drainage control)  If Loratadine becomes ineffective then change to either Xyzal, Allegra or Zyrtec 1 tab daily as needed.  These are all OTC long-acting antihsitamines similar to Loratadine that may be more effective  Nasal saline lavage (NeilMed) has been recommended as needed and prior to medicated nasal sprays along with instructions for proper administration.  Consider adding in Singulair if above regimens are not effective enough in controlling symptoms    Follow-up in 6 months or sooner if needed

## 2020-08-08 NOTE — Progress Notes (Signed)
Follow-up Note  RE: [name] MRN: [id number] DOB: 07/07/1965 Date of Office Visit: 08/08/2020   History of present illness: [name] is a 55 y.o. female presenting today for follow-up of coughing/upper airway cough syndrome and allergic rhinitis with nonallergic component.  She was last seen in the office on 05/05/20 by myself.  She is doing well today.  Today is the last day of school for her.  She states since her last visit she has not had any significant coughing.  This is much improved.  She use the nasal Atrovent early on with resolution of her symptoms and was able to stop use.  She did continue on loratadine since the nasal Atrovent was helpful.  She states she is only needed to use her albuterol inhaler twice since the last visit with resolution of symptoms.  She has not required any ED or urgent care visits for respiratory needs or systemic steroids.  Review of systems: Review of Systems  Constitutional: Negative.   HENT: Negative.   Eyes: Negative.   Respiratory: Negative.   Cardiovascular: Negative.   Gastrointestinal: Negative.   Musculoskeletal: Negative.   Skin: Negative.   Neurological: Negative.     All other systems negative unless noted above in HPI  Past medical/social/surgical/family history have been reviewed and are unchanged unless specifically indicated below.  No changes  Medication List: Current Outpatient Medications  Medication Sig Dispense Refill  . albuterol (VENTOLIN HFA) 108 (90 Base) MCG/ACT inhaler 2 puffs every 4-6 hours as needed for cough/wheeze/shortness of breath.chest tightness 18 g 2  . Cholecalciferol (VITAMIN D3) 125 MCG (5000 UT) CAPS Take by mouth daily.    Marland Kitchen EPINEPHrine 0.3 mg/0.3 mL IJ SOAJ injection Inject 0.3 mg into the muscle as needed for anaphylaxis. 1 each 1  . hydrochlorothiazide (HYDRODIURIL) 25 MG tablet take 25mg  by mouth once daily 90 tablet 2  . ipratropium (ATROVENT) 0.06 % nasal spray 2 spray  each nostril 1-2 times daily. 15 mL 5  . loratadine (CLARITIN) 10 MG tablet Take 10 mg by mouth daily as needed for allergies.    Marland Kitchen ondansetron (ZOFRAN-ODT) 4 MG disintegrating tablet Take 4 mg by mouth every 8 (eight) hours as needed.    Marland Kitchen SAXENDA 18 MG/3ML SOPN Inject into the skin.     No current facility-administered medications for this visit.     Known medication allergies: Allergies  Allergen Reactions  . Other     general anesthesia - upsets stomach   . Penicillins Hives and Itching    Has patient had a PCN reaction causing immediate rash, facial/tongue/throat swelling, SOB or lightheadedness with hypotension: Yes Has patient had a PCN reaction causing severe rash involving mucus membranes or skin necrosis: Yes Has patient had a PCN reaction that required hospitalization: No Has patient had a PCN reaction occurring within the last 10 years: No If all of the above answers are "NO", then may proceed with Cephalosporin use.      Physical examination: Blood pressure 132/84, pulse 90, temperature 98 F (36.7 C), temperature source Temporal, resp. rate (!) 22, height 5' 3.75" (1.619 m), weight (!) 319 lb 3.2 oz (144.8 kg), SpO2 97 %.  General: Alert, interactive, in no acute distress. HEENT: PERRLA, TMs pearly gray, turbinates non-edematous without discharge, post-pharynx non erythematous. Neck: Supple without lymphadenopathy. Lungs: Clear to auscultation without wheezing, rhonchi or rales. {no increased work of breathing. CV: Normal S1, S2 without murmurs. Abdomen: Nondistended, nontender. Skin: Warm and dry, without  lesions or rashes. Extremities:  No clubbing, cyanosis or edema. Neuro:   Grossly intact.  Diagnositics/Labs: Spirometry: FEV1: 1.81L 81%, FVC: 2.22L 79%, ratio consistent with Nonobstructive pattern   Assessment and plan:   Upper airway cough syndrome Spirometry today shows normal ventilatory function.    have access to albuterol inhaler 2 puffs every  4-6 hours as needed for cough/wheeze/shortness of breath/chest tightness.  May use 15-20 minutes prior to activity.   Monitor frequency of use and if it helps with cough cessation   Allergic rhinitis with nonallergic component Skin testing 2018 revealed mild/borderline reactivity to grass, mold, and cockroach antigen.  Continue allergen avoidance measures  Continue nasal Atrovent 2 sprays each nostril 1-2 times a day (can use up to 3-4 times a day if needed for nasal drainage control)  If Loratadine becomes ineffective then change to either Xyzal, Allegra or Zyrtec 1 tab daily as needed.  These are all OTC long-acting antihsitamines similar to Loratadine that may be more effective  Nasal saline lavage (NeilMed) has been recommended as needed and prior to medicated nasal sprays along with instructions for proper administration.  Consider adding in Singulair if above regimens are not effective enough in controlling symptoms    Follow-up in 6 months or sooner if needed   I appreciate the opportunity to take part in Steffani's care. Please do not hesitate to contact me with questions.  Sincerely,   Prudy Feeler, MD Allergy/Immunology [facility]

## 2020-09-27 ENCOUNTER — Other Ambulatory Visit: Payer: Self-pay

## 2020-09-27 ENCOUNTER — Ambulatory Visit (INDEPENDENT_AMBULATORY_CARE_PROVIDER_SITE_OTHER): Payer: BC Managed Care – PPO

## 2020-09-27 DIAGNOSIS — Z23 Encounter for immunization: Secondary | ICD-10-CM | POA: Diagnosis not present

## 2020-09-28 ENCOUNTER — Other Ambulatory Visit: Payer: Self-pay | Admitting: Family Medicine

## 2020-09-28 DIAGNOSIS — Z1231 Encounter for screening mammogram for malignant neoplasm of breast: Secondary | ICD-10-CM

## 2020-11-02 ENCOUNTER — Ambulatory Visit: Payer: BC Managed Care – PPO | Admitting: Student

## 2020-11-02 ENCOUNTER — Telehealth: Payer: Self-pay

## 2020-11-02 ENCOUNTER — Other Ambulatory Visit: Payer: Self-pay | Admitting: Student

## 2020-11-02 ENCOUNTER — Other Ambulatory Visit: Payer: Self-pay

## 2020-11-02 ENCOUNTER — Encounter: Payer: Self-pay | Admitting: Student

## 2020-11-02 VITALS — BP 99/65 | HR 80 | Ht 64.0 in | Wt 311.2 lb

## 2020-11-02 DIAGNOSIS — N6320 Unspecified lump in the left breast, unspecified quadrant: Secondary | ICD-10-CM | POA: Insufficient documentation

## 2020-11-02 DIAGNOSIS — E785 Hyperlipidemia, unspecified: Secondary | ICD-10-CM | POA: Diagnosis not present

## 2020-11-02 DIAGNOSIS — N63 Unspecified lump in unspecified breast: Secondary | ICD-10-CM

## 2020-11-02 DIAGNOSIS — R7303 Prediabetes: Secondary | ICD-10-CM

## 2020-11-02 LAB — POCT GLYCOSYLATED HEMOGLOBIN (HGB A1C): Hemoglobin A1C: 5.2 % (ref 4.0–5.6)

## 2020-11-02 NOTE — Assessment & Plan Note (Addendum)
Patient last A1c a year ago was 5.5. Her A1c today was 5.2. She denies having polydipsia or polyuria. Her A1c in the last 5 years has been within normal limit. Discussed patient's normal A1c with Dr. Katheran Awe who believed patient doesn't meet the criteria for prediabetes. -will removed prediabetes from patient's problem list.

## 2020-11-02 NOTE — Assessment & Plan Note (Addendum)
Physical exam was positive for smooth mobile mass at 4'oclock position of the left breast. The mass is round and about 2 cm in size. There are no tenderness, nipple discharge,or left axilla lymphoadenopathy.  Patient's presentation has strong consideration for a benign breast mas. More likely differentials include fibroadenoma, fat necrosis or cyst. Although less likely, not ruling out possible malignancy yet until after further work up. -Encouraged patient to follow through with her scheduled mammogram -Changed  mammogram from screening test to diagnostic

## 2020-11-02 NOTE — Progress Notes (Signed)
    SUBJECTIVE:   CHIEF COMPLAINT / HPI: Left Breast  Mass  Left Breast Mass 54 yo female with history of Prediabetes, HTN and HLD present today for a palpable mass on her left breast. Patient said she first felt the mass in June. This is the first time she has felt a lump on her breast. Patient said she hasn't experience any prior blunt trauma to chest. She denies any pain, discharge, fever or chills associated with the mass.   Check A1c Patient said she was diagnosed with prediabetes by her previous PCP. It's been a year since has last A1c check. She denies polyuria or polydipsia. Her last A1c a year ago was 5.5.   PERTINENT  PMH / PSH: HTN, HLD, Pre Diabetes  Family History: 2 maternal aunts with history of breast cancer (one aunt is deceased).   OBJECTIVE:   BP 99/65   Pulse 80   Ht '5\' 4"'$  (1.626 m)   Wt (!) 311 lb 3.2 oz (141.2 kg)   SpO2 100%   BMI 53.42 kg/m    Physical Exam General: Alert, well appearing, NAD, Oriented x4 Breast: mobile smooth left breast mass, no tenderness or nipple discharge. No axial lymphadenopathy  Cardiovascular: RRR, No Murmurs, Normal S2/S2 Respiratory: CTAB, No wheezing or Rales Abdomen: No distension or tenderness Extremities: No edema on extremities   Skin: Warm and dry  ASSESSMENT/PLAN:   Left breast mass Physical exam was positive for smooth mobile mass at 4'oclock position of the left breast. The mass is round and about 2 cm in size. There are no tenderness, nipple discharge,or left axilla lymphoadenopathy.  Patient's presentation has strong consideration for a benign breast mas. More likely differentials include fibroadenoma, fat necrosis or cyst. Although less likely, not ruling out possible malignancy yet until after further work up. -Encouraged patient to follow through with her scheduled mammogram -Changed  mammogram from screening test to diagnostic   Pre-diabetes Patient last A1c a year ago was 5.5. Her A1c today was 5.2. She  denies having polydipsia or polyuria. Her A1c in the last 5 years has been within normal limit. Discussed patient's normal A1c with Dr. Katheran Awe who believed patient doesn't meet the criteria for prediabetes. -will removed prediabetes from patient's problem list.   Follow up in 2 months  for your annual check up and Lipid Panel     Alen Bleacher, MD [facility]

## 2020-11-02 NOTE — Telephone Encounter (Signed)
Spoke with patient informed of Mammogram at [facility] on 9/23 at 2:30. Patient understood. Salvatore Marvel, CMA

## 2020-11-02 NOTE — Patient Instructions (Signed)
It was wonderful to meet you today. Thank you for allowing me to be a part of your care. Below is a short summary of what we discussed at your visit today:  On physical exam felt a smooth mass at 4 0'clock position in the left breast. We will look to upgrade your scheduled mammogram from screening mammogram to diagnostics  Your A1c today was 5.2  Follow up in end of October for your annual check up and Lipid Panel   If you have any questions or concerns, please do not hesitate to contact us via phone or MyChart message.   Alen Bleacher, MD [facility]

## 2020-11-17 ENCOUNTER — Ambulatory Visit: Payer: BC Managed Care – PPO

## 2020-11-25 ENCOUNTER — Other Ambulatory Visit: Payer: Self-pay | Admitting: Student

## 2020-11-25 ENCOUNTER — Other Ambulatory Visit: Payer: Self-pay

## 2020-11-25 ENCOUNTER — Ambulatory Visit
Admission: RE | Admit: 2020-11-25 | Discharge: 2020-11-25 | Disposition: A | Discharge status: Home / Self Care | Payer: BC Managed Care – PPO | Source: Ambulatory Visit | Attending: Family Medicine | Admitting: Family Medicine

## 2020-11-25 ENCOUNTER — Ambulatory Visit: Payer: BC Managed Care – PPO

## 2020-11-25 DIAGNOSIS — N63 Unspecified lump in unspecified breast: Secondary | ICD-10-CM

## 2020-11-29 ENCOUNTER — Other Ambulatory Visit: Payer: Self-pay | Admitting: *Deleted

## 2020-11-29 DIAGNOSIS — I10 Essential (primary) hypertension: Secondary | ICD-10-CM

## 2020-11-29 MED ORDER — HYDROCHLOROTHIAZIDE 25 MG PO TABS: ORAL_TABLET | ORAL | 2 refills | Status: DC

## 2020-12-05 ENCOUNTER — Other Ambulatory Visit (HOSPITAL_COMMUNITY)
Admission: RE | Admit: 2020-12-05 | Discharge: 2020-12-05 | Disposition: A | Discharge status: Home / Self Care | Payer: BC Managed Care – PPO | Source: Ambulatory Visit | Attending: Diagnostic Radiology | Admitting: Diagnostic Radiology

## 2020-12-05 ENCOUNTER — Ambulatory Visit
Admission: RE | Admit: 2020-12-05 | Discharge: 2020-12-05 | Disposition: A | Discharge status: Home / Self Care | Payer: BC Managed Care – PPO | Source: Ambulatory Visit | Attending: Family Medicine | Admitting: Family Medicine

## 2020-12-05 ENCOUNTER — Other Ambulatory Visit: Payer: Self-pay

## 2020-12-05 DIAGNOSIS — N63 Unspecified lump in unspecified breast: Secondary | ICD-10-CM

## 2020-12-05 DIAGNOSIS — N6325 Unspecified lump in the left breast, overlapping quadrants: Secondary | ICD-10-CM | POA: Diagnosis present

## 2020-12-05 DIAGNOSIS — D242 Benign neoplasm of left breast: Secondary | ICD-10-CM | POA: Insufficient documentation

## 2020-12-08 LAB — SURGICAL PATHOLOGY

## 2020-12-15 ENCOUNTER — Other Ambulatory Visit: Payer: Self-pay | Admitting: Urology

## 2021-01-02 ENCOUNTER — Ambulatory Visit: Payer: BC Managed Care – PPO

## 2021-01-04 ENCOUNTER — Other Ambulatory Visit: Payer: Self-pay

## 2021-01-04 ENCOUNTER — Encounter (HOSPITAL_COMMUNITY): Payer: Self-pay | Admitting: Emergency Medicine

## 2021-01-04 ENCOUNTER — Ambulatory Visit (HOSPITAL_COMMUNITY)
Admission: EM | Admit: 2021-01-04 | Discharge: 2021-01-04 | Disposition: A | Discharge status: Home / Self Care | Payer: BC Managed Care – PPO | Attending: Family Medicine | Admitting: Family Medicine

## 2021-01-04 DIAGNOSIS — J069 Acute upper respiratory infection, unspecified: Secondary | ICD-10-CM

## 2021-01-04 DIAGNOSIS — R062 Wheezing: Secondary | ICD-10-CM

## 2021-01-04 MED ORDER — PREDNISONE 20 MG PO TABS: 40.0000 mg | ORAL_TABLET | Freq: Every day | ORAL | 0 refills | Status: DC

## 2021-01-04 MED ORDER — BENZONATATE 100 MG PO CAPS: ORAL_CAPSULE | ORAL | 0 refills | Status: DC

## 2021-01-04 NOTE — ED Triage Notes (Signed)
Pt c/o cough, sore throat, congestion and ear pains since last Friday.

## 2021-01-04 NOTE — ED Provider Notes (Signed)
[facility]   [id number] 01/04/21 Arrival Time: 7510  ASSESSMENT & PLAN:  1. Viral URI with cough   2. Wheezing    Discussed typical duration of viral illnesses. OTC symptom care as needed. Work note provided.  Meds ordered this encounter  Medications   predniSONE (DELTASONE) 20 MG tablet    Sig: Take 2 tablets (40 mg total) by mouth daily.    Dispense:  10 tablet    Refill:  0   benzonatate (TESSALON) 100 MG capsule    Sig: Take 1 capsule by mouth every 8 (eight) hours for cough.    Dispense:  21 capsule    Refill:  0     Follow-up Information     Alen Bleacher, MD.   Specialty: Family Medicine Why: As needed. Contact information: [address] [place] [address] [phone]                 Reviewed expectations re: course of current medical issues. Questions answered. Outlined signs and symptoms indicating need for more acute intervention. Understanding verbalized. After Visit Summary given.   SUBJECTIVE: History from: patient. [name] is a 55 y.o. female who reports: cough, ST, nasal congestion, ear pressure; abrupt onset; x 4-5 d. Denies: fever. Does report wheezing at night. Normal PO intake without n/v/d.   OBJECTIVE:  Vitals:   01/04/21 1018  BP: (!) 152/96  Pulse: 88  Resp: 19  Temp: 98.5 F (36.9 C)  TempSrc: Oral  SpO2: 96%    General appearance: alert; no distress Eyes: PERRLA; EOMI; conjunctiva normal HENT: [place]; AT; with nasal congestion Neck: supple  Lungs: speaks full sentences without difficulty; unlabored; no active wheezing Extremities: no edema Skin: warm and dry Neurologic: normal gait Psychological: alert and cooperative; normal mood and affect   Allergies  Allergen Reactions   Other     general anesthesia - upsets stomach    Penicillins Hives and Itching    Has patient had a PCN reaction causing immediate rash, facial/tongue/throat swelling, SOB or lightheadedness with hypotension:  Yes Has patient had a PCN reaction causing severe rash involving mucus membranes or skin necrosis: Yes Has patient had a PCN reaction that required hospitalization: No Has patient had a PCN reaction occurring within the last 10 years: No If all of the above answers are "NO", then may proceed with Cephalosporin use.     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   Complication of anesthesia    UPSETS STOMACH   Hyperlipidemia    Hypertension    IBS (irritable bowel syndrome)    Morbid obesity (HCC)    PONV (postoperative nausea and vomiting)    Sinus complaint    Urticaria    Wears glasses    Social History   Socioeconomic History   Marital status: Single    Spouse name: Not on file   Number of children: Not on file   Years of education: Not on file   Highest education level: Not on file  Occupational History   Not on file  Tobacco Use   Smoking status: Never   Smokeless tobacco: Never  Vaping Use   Vaping Use: Never used  Substance and Sexual Activity   Alcohol use: No   Drug use: No   Sexual activity: Yes    Birth control/protection: Surgical  Other Topics Concern   Not on file  Social History Narrative   Not on  file   Social Determinants of Health   Financial Resource Strain: Not on file  Food Insecurity: No Food Insecurity   Worried About Charity fundraiser in the Last Year: Never true   Arboriculturist in the Last Year: Never true  Transportation Needs: Not on file  Physical Activity: Not on file  Stress: Not on file  Social Connections: Not on file  Intimate Partner Violence: Not on file   Family History  Problem Relation Age of Onset   Thyroid cancer Mother    Hypertension Mother    Hyperlipidemia Mother    Asthma Sister    Breast cancer Maternal Aunt    Breast cancer Maternal Aunt    Allergic rhinitis Brother    Angioedema Brother    Eczema Grandson    Immunodeficiency Neg Hx    Urticaria Neg Hx     Past Surgical History:  Procedure Laterality Date   ABDOMINAL HYSTERECTOMY     COMPLETE   CHOLECYSTECTOMY  1990   COLONOSCOPY WITH PROPOFOL N/A 08/22/2016   Procedure: COLONOSCOPY WITH PROPOFOL;  Surgeon: Arta Silence, MD;  Location: [facility];  Service: Endoscopy;  Laterality: N/A;   ESOPHAGEAL MANOMETRY N/A 07/06/2013   Procedure: ESOPHAGEAL MANOMETRY (EM);  Surgeon: Arta Silence, MD;  Location: [facility];  Service: Endoscopy;  Laterality: N/A;   GASTRIC RESTRICTION SURGERY  2010   lap band   Norlene Campbell, MD 01/04/21 1032

## 2021-02-01 ENCOUNTER — Other Ambulatory Visit: Payer: Self-pay

## 2021-02-01 ENCOUNTER — Ambulatory Visit (INDEPENDENT_AMBULATORY_CARE_PROVIDER_SITE_OTHER): Payer: BC Managed Care – PPO

## 2021-02-01 DIAGNOSIS — Z23 Encounter for immunization: Secondary | ICD-10-CM | POA: Diagnosis not present

## 2021-02-01 NOTE — Progress Notes (Signed)
Patient presents to nurse clinic for flu vaccination. Administered in LD, site unremarkable, tolerated injection well.   Lorrena Goranson C Keary Waterson, RN   

## 2021-02-01 NOTE — Patient Instructions (Addendum)
DUE TO COVID-19 ONLY ONE VISITOR IS ALLOWED TO COME WITH YOU AND STAY IN THE WAITING ROOM ONLY DURING PRE OP AND PROCEDURE.   **NO VISITORS ARE ALLOWED IN THE SHORT STAY AREA OR RECOVERY ROOM!!**  IF YOU WILL BE ADMITTED INTO THE HOSPITAL YOU ARE ALLOWED ONLY TWO SUPPORT PEOPLE DURING VISITATION HOURS ONLY (7 AM -8PM)   The support person(s) must pass our screening, gel in and out, and wear a mask at all times, including in the patient's room. Patients must also wear a mask when staff or their support person are in the room. Visitors GUEST BADGE MUST BE WORN VISIBLY  One adult visitor may remain with you overnight and MUST be in the room by 8 P.M.  No visitors under the age of 20. Any visitor under the age of 36 must be accompanied by an adult.    COVID SWAB TESTING MUST BE COMPLETED ON:  02/13/21 **MUST PRESENT COMPLETED FORM AT TESTING SITE**    [facility] [place] (backside of the building) You are not required to quarantine, however you are required to wear a well-fitted mask when you are out and around people not in your household.  Hand Hygiene often Do NOT share personal items Notify your provider if you are in close contact with someone who has COVID or you develop fever 100.4 or greater, new onset of sneezing, cough, sore throat, shortness of breath or body aches.  [place], [address], must go inside of the hospital, NOT A DRIVE THRU!  (Must self quarantine after testing. Follow instructions on handout.)       Your procedure is scheduled on:02/15/21    Report to [facility] Main Entrance    Report to admitting at : 6:15 AM   Call this number if you have problems the morning of surgery [phone]   May have liquids until : 5:30 AM   day of surgery  CLEAR LIQUID DIET: Starting day before surgery  Foods Allowed                                                                     Foods  Excluded  Water, Black Coffee and tea, regular and decaf                             liquids that you cannot  Plain Jell-O in any flavor  (No red)                                           see through such as: Fruit ices (not with fruit pulp)                                     milk, soups, orange juice              Iced Popsicles (No red)  All solid food                                   Apple juices Sports drinks like Gatorade (No red) Lightly seasoned clear broth or consume(fat free) Sugar  Sample Menu Breakfast                                Lunch                                     Supper Cranberry juice                    Beef broth                            Chicken broth Jell-O                                     Grape juice                           Apple juice Coffee or tea                        Jell-O                                      Popsicle                                                Coffee or tea                        Coffee or tea     Oral Hygiene is also important to reduce your risk of infection.                                    Remember - BRUSH YOUR TEETH THE MORNING OF SURGERY WITH YOUR REGULAR TOOTHPASTE   Do NOT smoke after Midnight   Take these medicines the morning of surgery with A SIP OF WATER: prednisone.Use inhalers as usual.  DO NOT TAKE ANY ORAL DIABETIC MEDICATIONS DAY OF YOUR SURGERY                              You may not have any metal on your body including hair pins, jewelry, and body piercing             Do not wear make-up, lotions, powders, perfumes/cologne, or deodorant  Do not wear nail polish including gel and S&S, artificial/acrylic nails, or any other type of covering on natural nails including finger and toenails. If you have artificial nails, gel coating, etc. that needs to be removed by a nail salon please have this removed prior to  surgery or surgery may need to be canceled/ delayed if the  surgeon/ anesthesia feels like they are unable to be safely monitored.   Do not shave  48 hours prior to surgery.    Do not bring valuables to the hospital. [place].   Contacts, dentures or bridgework may not be worn into surgery.   Bring small overnight bag day of surgery.    Patients discharged on the day of surgery will not be allowed to drive home.   Special Instructions: Bring a copy of your healthcare power of attorney and living will documents         the day of surgery if you haven't scanned them before.              Please read over the following fact sheets you were given: IF YOU HAVE QUESTIONS ABOUT YOUR PRE-OP INSTRUCTIONS PLEASE CALL [phone]     [facility] - Preparing for Surgery Before surgery, you can play an important role.  Because skin is not sterile, your skin needs to be as free of germs as possible.  You can reduce the number of germs on your skin by washing with CHG (chlorahexidine gluconate) soap before surgery.  CHG is an antiseptic cleaner which kills germs and bonds with the skin to continue killing germs even after washing. Please DO NOT use if you have an allergy to CHG or antibacterial soaps.  If your skin becomes reddened/irritated stop using the CHG and inform your nurse when you arrive at Short Stay. Do not shave (including legs and underarms) for at least 48 hours prior to the first CHG shower.  You may shave your face/neck. Please follow these instructions carefully:  1.  Shower with CHG Soap the night before surgery and the  morning of Surgery.  2.  If you choose to wash your hair, wash your hair first as usual with your  normal  shampoo.  3.  After you shampoo, rinse your hair and body thoroughly to remove the  shampoo.                           4.  Use CHG as you would any other liquid soap.  You can apply chg directly  to the skin and wash                       Gently with a scrungie or clean  washcloth.  5.  Apply the CHG Soap to your body ONLY FROM THE NECK DOWN.   Do not use on face/ open                           Wound or open sores. Avoid contact with eyes, ears mouth and genitals (private parts).                       Wash face,  Genitals (private parts) with your normal soap.             6.  Wash thoroughly, paying special attention to the area where your surgery  will be performed.  7.  Thoroughly rinse your body with warm water from the neck down.  8.  DO NOT shower/wash with your normal soap after using and rinsing off  the CHG Soap.                9.  Pat yourself dry with a clean towel.            10.  Wear clean pajamas.            11.  Place clean sheets on your bed the night of your first shower and do not  sleep with pets. Day of Surgery : Do not apply any lotions/deodorants the morning of surgery.  Please wear clean clothes to the hospital/surgery center.  FAILURE TO FOLLOW THESE INSTRUCTIONS MAY RESULT IN THE CANCELLATION OF YOUR SURGERY PATIENT SIGNATURE_________________________________  NURSE SIGNATURE__________________________________  ________________________________________________________________________

## 2021-02-02 ENCOUNTER — Other Ambulatory Visit: Payer: Self-pay

## 2021-02-02 ENCOUNTER — Encounter (HOSPITAL_COMMUNITY): Payer: Self-pay

## 2021-02-02 ENCOUNTER — Encounter (HOSPITAL_COMMUNITY)
Admission: RE | Admit: 2021-02-02 | Discharge: 2021-02-02 | Disposition: A | Discharge status: Home / Self Care | Payer: BC Managed Care – PPO | Source: Ambulatory Visit | Attending: Urology | Admitting: Urology

## 2021-02-02 DIAGNOSIS — Z01812 Encounter for preprocedural laboratory examination: Secondary | ICD-10-CM | POA: Insufficient documentation

## 2021-02-02 HISTORY — DX: Chronic kidney disease, unspecified: N18.9

## 2021-02-02 LAB — BASIC METABOLIC PANEL
Anion gap: 6 (ref 5–15)
BUN: 12 mg/dL (ref 6–20)
CO2: 29 mmol/L (ref 22–32)
Calcium: 8.9 mg/dL (ref 8.9–10.3)
Chloride: 103 mmol/L (ref 98–111)
Creatinine, Ser: 0.62 mg/dL (ref 0.44–1.00)
GFR, Estimated: >60 mL/min (ref >60)
Glucose, Bld: 85 mg/dL (ref 70–99)
Potassium: 3.4 mmol/L — ABNORMAL LOW (ref 3.5–5.1)
Sodium: 138 mmol/L (ref 135–145)

## 2021-02-02 LAB — TYPE AND SCREEN
ABO/RH(D): B POS
Antibody Screen: NEGATIVE

## 2021-02-02 LAB — CBC
HCT: 38.9 % (ref 36.0–46.0)
Hemoglobin: 12.7 g/dL (ref 12.0–15.0)
MCH: 28.2 pg (ref 26.0–34.0)
MCHC: 32.6 g/dL (ref 30.0–36.0)
MCV: 86.3 fL (ref 80.0–100.0)
Platelets: 290 10*3/uL (ref 150–400)
RBC: 4.51 MIL/uL (ref 3.87–5.11)
RDW: 13.9 % (ref 11.5–15.5)
WBC: 6.3 10*3/uL (ref 4.0–10.5)
nRBC: 0 % (ref 0.0–0.2)

## 2021-02-02 NOTE — Progress Notes (Addendum)
COVID Vaccine Completed: Yes Date COVID Vaccine completed:09/27/20 x 4 COVID vaccine manufacturer: Pfizer     PCP - Dr. Alen Bleacher. Cardiologist -   Chest x-ray -  EKG - 05/11/20 Stress Test -  ECHO -  Cardiac Cath -  Pacemaker/ICD device last checked:  Sleep Study - Yes CPAP - NO  Fasting Blood Sugar -  Checks Blood Sugar _____ times a day  Blood Thinner Instructions: Aspirin Instructions: Last Dose:  Anesthesia review: Hx: HTN  Patient denies shortness of breath, fever, cough and chest pain at PAT appointment   Patient verbalized understanding of instructions that were given to them at the PAT appointment. Patient was also instructed that they will need to review over the PAT instructions again at home before surgery.

## 2021-02-13 ENCOUNTER — Other Ambulatory Visit: Payer: Self-pay | Admitting: Urology

## 2021-02-14 LAB — SARS CORONAVIRUS 2 (TAT 6-24 HRS): SARS Coronavirus 2: NEGATIVE

## 2021-02-14 NOTE — Anesthesia Preprocedure Evaluation (Addendum)
Anesthesia Evaluation  Patient identified by MRN, date of birth, ID band Patient awake    Reviewed: Allergy & Precautions, H&P , NPO status , Patient's Chart, lab work & pertinent test results  History of Anesthesia Complications (+) PONV  Airway Mallampati: II  TM Distance: >3 FB Neck ROM: Full    Dental no notable dental hx. (+) Teeth Intact, Dental Advisory Given   Pulmonary neg pulmonary ROS,    Pulmonary exam normal breath sounds clear to auscultation       Cardiovascular Exercise Tolerance: Good hypertension, Pt. on medications  Rhythm:Regular Rate:Normal     Neuro/Psych Anxiety negative neurological ROS     GI/Hepatic Neg liver ROS, GERD  ,  Endo/Other  negative endocrine ROSMorbid obesity  Renal/GU Renal disease  negative genitourinary   Musculoskeletal  (+) Arthritis , Osteoarthritis,    Abdominal   Peds  Hematology negative hematology ROS (+)   Anesthesia Other Findings   Reproductive/Obstetrics negative OB ROS                            Anesthesia Physical Anesthesia Plan  ASA: 3  Anesthesia Plan: General   Post-op Pain Management: Tylenol PO (pre-op)   Induction: Intravenous  PONV Risk Score and Plan: 4 or greater and Ondansetron, Dexamethasone, Midazolam, Scopolamine patch - Pre-op and Propofol infusion  Airway Management Planned: Oral ETT  Additional Equipment:   Intra-op Plan:   Post-operative Plan: Extubation in OR  Informed Consent: I have reviewed the patients History and Physical, chart, labs and discussed the procedure including the risks, benefits and alternatives for the proposed anesthesia with the patient or authorized representative who has indicated his/her understanding and acceptance.     Dental advisory given  Plan Discussed with: CRNA  Anesthesia Plan Comments:        Anesthesia Quick Evaluation

## 2021-02-15 ENCOUNTER — Observation Stay (HOSPITAL_COMMUNITY)
Admission: RE | Admit: 2021-02-15 | Discharge: 2021-02-16 | Disposition: A | Discharge status: Home / Self Care | Payer: BC Managed Care – PPO | Source: Ambulatory Visit | Attending: Urology | Admitting: Urology

## 2021-02-15 ENCOUNTER — Encounter (HOSPITAL_COMMUNITY)
Admission: RE | Disposition: A | Discharge status: Home / Self Care | Payer: Self-pay | Source: Ambulatory Visit | Attending: Urology

## 2021-02-15 ENCOUNTER — Ambulatory Visit (HOSPITAL_COMMUNITY): Payer: BC Managed Care – PPO | Admitting: Anesthesiology

## 2021-02-15 ENCOUNTER — Encounter (HOSPITAL_COMMUNITY): Payer: Self-pay | Admitting: Urology

## 2021-02-15 ENCOUNTER — Other Ambulatory Visit: Payer: Self-pay

## 2021-02-15 DIAGNOSIS — D1771 Benign lipomatous neoplasm of kidney: Secondary | ICD-10-CM | POA: Diagnosis not present

## 2021-02-15 DIAGNOSIS — N2889 Other specified disorders of kidney and ureter: Secondary | ICD-10-CM | POA: Diagnosis present

## 2021-02-15 DIAGNOSIS — N189 Chronic kidney disease, unspecified: Secondary | ICD-10-CM | POA: Insufficient documentation

## 2021-02-15 DIAGNOSIS — I129 Hypertensive chronic kidney disease with stage 1 through stage 4 chronic kidney disease, or unspecified chronic kidney disease: Secondary | ICD-10-CM | POA: Insufficient documentation

## 2021-02-15 DIAGNOSIS — Z8543 Personal history of malignant neoplasm of ovary: Secondary | ICD-10-CM | POA: Diagnosis not present

## 2021-02-15 HISTORY — PX: ROBOTIC ASSITED PARTIAL NEPHRECTOMY: SHX6087

## 2021-02-15 LAB — HEMOGLOBIN AND HEMATOCRIT, BLOOD
HCT: 35.8 % — ABNORMAL LOW (ref 36.0–46.0)
Hemoglobin: 11.6 g/dL — ABNORMAL LOW (ref 12.0–15.0)

## 2021-02-15 LAB — ABO/RH: ABO/RH(D): B POS

## 2021-02-15 SURGERY — NEPHRECTOMY, PARTIAL, ROBOT-ASSISTED
Anesthesia: General | Site: Abdomen | Laterality: Left

## 2021-02-15 MED ORDER — CHLORHEXIDINE GLUCONATE CLOTH 2 % EX PADS: 6.0000 | MEDICATED_PAD | Freq: Every day | CUTANEOUS | Status: DC

## 2021-02-15 MED ORDER — CHLORHEXIDINE GLUCONATE 0.12 % MT SOLN
15.0000 mL | Freq: Once | OROMUCOSAL | Status: AC
  Administered 2021-02-15: 07:00:00 15 mL via OROMUCOSAL

## 2021-02-15 MED ORDER — 0.9 % SODIUM CHLORIDE (POUR BTL) OPTIME
TOPICAL | Status: DC | PRN
  Administered 2021-02-15: 11:00:00 1000 mL

## 2021-02-15 MED ORDER — ROCURONIUM BROMIDE 10 MG/ML (PF) SYRINGE
PREFILLED_SYRINGE | INTRAVENOUS | Status: DC | PRN
  Administered 2021-02-15 (×2): 20 mg via INTRAVENOUS
  Administered 2021-02-15: 80 mg via INTRAVENOUS

## 2021-02-15 MED ORDER — SUGAMMADEX SODIUM 500 MG/5ML IV SOLN
INTRAVENOUS | Status: AC
  Filled 2021-02-15: qty 5

## 2021-02-15 MED ORDER — ACETAMINOPHEN 500 MG PO TABS
1000.0000 mg | ORAL_TABLET | Freq: Four times a day (QID) | ORAL | Status: AC
  Administered 2021-02-15 – 2021-02-16 (×4): 1000 mg via ORAL
  Filled 2021-02-15 (×4): qty 2

## 2021-02-15 MED ORDER — PROPOFOL 10 MG/ML IV BOLUS
INTRAVENOUS | Status: AC
  Filled 2021-02-15: qty 20

## 2021-02-15 MED ORDER — DEXAMETHASONE SODIUM PHOSPHATE 4 MG/ML IJ SOLN
INTRAMUSCULAR | Status: DC | PRN
  Administered 2021-02-15: 8 mg via INTRAVENOUS

## 2021-02-15 MED ORDER — LABETALOL HCL 5 MG/ML IV SOLN
INTRAVENOUS | Status: DC | PRN
  Administered 2021-02-15: 5 mg via INTRAVENOUS

## 2021-02-15 MED ORDER — SODIUM CHLORIDE (PF) 0.9 % IJ SOLN
INTRAMUSCULAR | Status: AC
  Filled 2021-02-15: qty 20

## 2021-02-15 MED ORDER — LIDOCAINE HCL (PF) 2 % IJ SOLN
INTRAMUSCULAR | Status: DC | PRN
  Administered 2021-02-15: 1.5 mg/kg/h via INTRADERMAL

## 2021-02-15 MED ORDER — BELLADONNA ALKALOIDS-OPIUM 16.2-60 MG RE SUPP: 1.0000 | Freq: Four times a day (QID) | RECTAL | Status: DC | PRN

## 2021-02-15 MED ORDER — SUGAMMADEX SODIUM 500 MG/5ML IV SOLN
INTRAVENOUS | Status: DC | PRN
  Administered 2021-02-15: 400 mg via INTRAVENOUS

## 2021-02-15 MED ORDER — LIDOCAINE HCL (PF) 2 % IJ SOLN
INTRAMUSCULAR | Status: AC
  Filled 2021-02-15: qty 10

## 2021-02-15 MED ORDER — PHENYLEPHRINE 40 MCG/ML (10ML) SYRINGE FOR IV PUSH (FOR BLOOD PRESSURE SUPPORT)
PREFILLED_SYRINGE | INTRAVENOUS | Status: DC | PRN
  Administered 2021-02-15 (×4): 80 ug via INTRAVENOUS

## 2021-02-15 MED ORDER — ONDANSETRON HCL 4 MG/2ML IJ SOLN
INTRAMUSCULAR | Status: AC
  Filled 2021-02-15: qty 2

## 2021-02-15 MED ORDER — EPHEDRINE 5 MG/ML INJ
INTRAVENOUS | Status: AC
  Filled 2021-02-15: qty 5

## 2021-02-15 MED ORDER — PHENYLEPHRINE 40 MCG/ML (10ML) SYRINGE FOR IV PUSH (FOR BLOOD PRESSURE SUPPORT)
PREFILLED_SYRINGE | INTRAVENOUS | Status: AC
  Filled 2021-02-15: qty 10

## 2021-02-15 MED ORDER — SCOPOLAMINE 1 MG/3DAYS TD PT72
MEDICATED_PATCH | TRANSDERMAL | Status: AC
  Filled 2021-02-15: qty 1

## 2021-02-15 MED ORDER — PROPOFOL 10 MG/ML IV BOLUS
INTRAVENOUS | Status: DC | PRN
  Administered 2021-02-15: 200 mg via INTRAVENOUS

## 2021-02-15 MED ORDER — DEXTROSE-NACL 5-0.45 % IV SOLN: INTRAVENOUS | Status: DC

## 2021-02-15 MED ORDER — EPHEDRINE SULFATE 50 MG/ML IJ SOLN
INTRAMUSCULAR | Status: DC | PRN
  Administered 2021-02-15 (×3): 5 mg via INTRAVENOUS

## 2021-02-15 MED ORDER — MIDAZOLAM HCL 2 MG/2ML IJ SOLN
INTRAMUSCULAR | Status: AC
  Filled 2021-02-15: qty 2

## 2021-02-15 MED ORDER — ONDANSETRON HCL 4 MG/2ML IJ SOLN
INTRAMUSCULAR | Status: DC | PRN
  Administered 2021-02-15: 4 mg via INTRAVENOUS

## 2021-02-15 MED ORDER — DIPHENHYDRAMINE HCL 50 MG/ML IJ SOLN: 12.5000 mg | Freq: Four times a day (QID) | INTRAMUSCULAR | Status: DC | PRN

## 2021-02-15 MED ORDER — ORAL CARE MOUTH RINSE: 15.0000 mL | Freq: Once | OROMUCOSAL | Status: AC

## 2021-02-15 MED ORDER — DIPHENHYDRAMINE HCL 50 MG/ML IJ SOLN
INTRAMUSCULAR | Status: AC
  Filled 2021-02-15: qty 1

## 2021-02-15 MED ORDER — ALBUMIN HUMAN 5 % IV SOLN: INTRAVENOUS | Status: DC | PRN

## 2021-02-15 MED ORDER — LACTATED RINGERS IV SOLN: INTRAVENOUS | Status: DC

## 2021-02-15 MED ORDER — "VISTASEAL 4 ML SINGLE DOSE KIT "
PACK | CUTANEOUS | Status: DC | PRN
  Administered 2021-02-15: 4 mL via TOPICAL

## 2021-02-15 MED ORDER — BUPIVACAINE LIPOSOME 1.3 % IJ SUSP
INTRAMUSCULAR | Status: DC | PRN
  Administered 2021-02-15: 20 mL

## 2021-02-15 MED ORDER — LABETALOL HCL 5 MG/ML IV SOLN
INTRAVENOUS | Status: AC
  Filled 2021-02-15: qty 4

## 2021-02-15 MED ORDER — LACTATED RINGERS IR SOLN
Status: DC | PRN
  Administered 2021-02-15: 1000 mL

## 2021-02-15 MED ORDER — HYDROMORPHONE HCL 1 MG/ML IJ SOLN
INTRAMUSCULAR | Status: AC
  Administered 2021-02-15: 13:00:00 0.5 mg via INTRAVENOUS
  Filled 2021-02-15: qty 2

## 2021-02-15 MED ORDER — BUPIVACAINE LIPOSOME 1.3 % IJ SUSP
INTRAMUSCULAR | Status: AC
  Filled 2021-02-15: qty 20

## 2021-02-15 MED ORDER — PHENYLEPHRINE HCL (PRESSORS) 10 MG/ML IV SOLN
INTRAVENOUS | Status: AC
  Filled 2021-02-15: qty 1

## 2021-02-15 MED ORDER — DEXAMETHASONE SODIUM PHOSPHATE 10 MG/ML IJ SOLN
INTRAMUSCULAR | Status: AC
  Filled 2021-02-15: qty 1

## 2021-02-15 MED ORDER — HYDROMORPHONE HCL 1 MG/ML IJ SOLN
0.5000 mg | INTRAMUSCULAR | Status: DC | PRN
Start: 2021-02-15 — End: 2021-02-16

## 2021-02-15 MED ORDER — CLINDAMYCIN PHOSPHATE 600 MG/50ML IV SOLN
600.0000 mg | Freq: Three times a day (TID) | INTRAVENOUS | Status: AC
  Administered 2021-02-15 – 2021-02-16 (×3): 600 mg via INTRAVENOUS
  Filled 2021-02-15 (×3): qty 50

## 2021-02-15 MED ORDER — DIPHENHYDRAMINE HCL 50 MG/ML IJ SOLN
INTRAMUSCULAR | Status: DC | PRN
  Administered 2021-02-15: 12.5 mg via INTRAVENOUS

## 2021-02-15 MED ORDER — DOCUSATE SODIUM 100 MG PO CAPS
100.0000 mg | ORAL_CAPSULE | Freq: Two times a day (BID) | ORAL | Status: DC
  Administered 2021-02-15 – 2021-02-16 (×2): 100 mg via ORAL
  Filled 2021-02-15 (×2): qty 1

## 2021-02-15 MED ORDER — ONDANSETRON HCL 4 MG/2ML IJ SOLN
4.0000 mg | INTRAMUSCULAR | Status: DC | PRN
  Administered 2021-02-15: 18:00:00 4 mg via INTRAVENOUS
  Filled 2021-02-15: qty 2

## 2021-02-15 MED ORDER — HYDROMORPHONE HCL 1 MG/ML IJ SOLN
0.2500 mg | INTRAMUSCULAR | Status: DC | PRN
Start: 2021-02-15 — End: 2021-02-15
  Administered 2021-02-15 (×2): 0.25 mg via INTRAVENOUS

## 2021-02-15 MED ORDER — LIDOCAINE HCL (PF) 2 % IJ SOLN
INTRAMUSCULAR | Status: AC
  Filled 2021-02-15: qty 5

## 2021-02-15 MED ORDER — PHENYLEPHRINE HCL-NACL 20-0.9 MG/250ML-% IV SOLN
INTRAVENOUS | Status: DC | PRN
  Administered 2021-02-15: 80 ug/min via INTRAVENOUS

## 2021-02-15 MED ORDER — CLINDAMYCIN PHOSPHATE 900 MG/50ML IV SOLN
900.0000 mg | Freq: Once | INTRAVENOUS | Status: AC
  Administered 2021-02-15: 09:00:00 900 mg via INTRAVENOUS
  Filled 2021-02-15: qty 50

## 2021-02-15 MED ORDER — "VISTASEAL 4 ML SINGLE DOSE KIT "
4.0000 mL | PACK | Freq: Once | CUTANEOUS | Status: DC
  Filled 2021-02-15: qty 4

## 2021-02-15 MED ORDER — HYDROCODONE-ACETAMINOPHEN 5-325 MG PO TABS
1.0000 | ORAL_TABLET | Freq: Four times a day (QID) | ORAL | 0 refills | Status: DC | PRN
Start: 2021-02-15 — End: 2022-04-26

## 2021-02-15 MED ORDER — OXYCODONE HCL 5 MG PO TABS: 5.0000 mg | ORAL_TABLET | ORAL | Status: DC | PRN

## 2021-02-15 MED ORDER — ALBUTEROL SULFATE (2.5 MG/3ML) 0.083% IN NEBU: 2.5000 mg | INHALATION_SOLUTION | RESPIRATORY_TRACT | Status: DC | PRN

## 2021-02-15 MED ORDER — SCOPOLAMINE 1 MG/3DAYS TD PT72
MEDICATED_PATCH | TRANSDERMAL | Status: DC | PRN
  Administered 2021-02-15: 1 via TRANSDERMAL

## 2021-02-15 MED ORDER — DIPHENHYDRAMINE HCL 12.5 MG/5ML PO ELIX: 12.5000 mg | ORAL_SOLUTION | Freq: Four times a day (QID) | ORAL | Status: DC | PRN

## 2021-02-15 MED ORDER — PROMETHAZINE HCL 12.5 MG PO TABS: 12.5000 mg | ORAL_TABLET | ORAL | 0 refills | Status: DC | PRN

## 2021-02-15 MED ORDER — ACETAMINOPHEN 500 MG PO TABS
1000.0000 mg | ORAL_TABLET | Freq: Once | ORAL | Status: AC
  Administered 2021-02-15: 07:00:00 1000 mg via ORAL
  Filled 2021-02-15: qty 2

## 2021-02-15 MED ORDER — MIDAZOLAM HCL 5 MG/5ML IJ SOLN
INTRAMUSCULAR | Status: DC | PRN
  Administered 2021-02-15 (×2): 1 mg via INTRAVENOUS

## 2021-02-15 MED ORDER — LIDOCAINE 2% (20 MG/ML) 5 ML SYRINGE
INTRAMUSCULAR | Status: DC | PRN
  Administered 2021-02-15: 60 mg via INTRAVENOUS

## 2021-02-15 MED ORDER — ROCURONIUM BROMIDE 10 MG/ML (PF) SYRINGE
PREFILLED_SYRINGE | INTRAVENOUS | Status: AC
  Filled 2021-02-15: qty 10

## 2021-02-15 MED ORDER — IPRATROPIUM BROMIDE 0.06 % NA SOLN: 2.0000 | Freq: Two times a day (BID) | NASAL | Status: DC | PRN

## 2021-02-15 MED ORDER — SODIUM CHLORIDE (PF) 0.9 % IJ SOLN
INTRAMUSCULAR | Status: DC | PRN
  Administered 2021-02-15: 20 mL

## 2021-02-15 MED ORDER — KETAMINE HCL-SODIUM CHLORIDE 100-0.9 MG/10ML-% IV SOSY
PREFILLED_SYRINGE | INTRAVENOUS | Status: DC | PRN
  Administered 2021-02-15: 50 mg via INTRAVENOUS

## 2021-02-15 MED ORDER — DOCUSATE SODIUM 100 MG PO CAPS: 100.0000 mg | ORAL_CAPSULE | Freq: Two times a day (BID) | ORAL | Status: DC

## 2021-02-15 MED ORDER — KETAMINE HCL-SODIUM CHLORIDE 100-0.9 MG/10ML-% IV SOSY
PREFILLED_SYRINGE | INTRAVENOUS | Status: AC
  Filled 2021-02-15: qty 10

## 2021-02-15 MED ORDER — FENTANYL CITRATE (PF) 100 MCG/2ML IJ SOLN
INTRAMUSCULAR | Status: DC | PRN
  Administered 2021-02-15: 100 ug via INTRAVENOUS
  Administered 2021-02-15: 50 ug via INTRAVENOUS

## 2021-02-15 MED ORDER — FENTANYL CITRATE (PF) 250 MCG/5ML IJ SOLN
INTRAMUSCULAR | Status: AC
  Filled 2021-02-15: qty 5

## 2021-02-15 SURGICAL SUPPLY — 74 items
APPLICATOR VISTASEAL 35 (MISCELLANEOUS) ×2 IMPLANT
BAG COUNTER SPONGE SURGICOUNT (BAG) IMPLANT
CHLORAPREP W/TINT 26 (MISCELLANEOUS) ×2 IMPLANT
CLIP LIGATING HEM O LOK PURPLE (MISCELLANEOUS) ×3 IMPLANT
CLIP LIGATING HEMO LOK XL GOLD (MISCELLANEOUS) IMPLANT
CLIP LIGATING HEMO O LOK GREEN (MISCELLANEOUS) ×4 IMPLANT
CLIP SUT LAPRA TY ABSORB (SUTURE) ×5 IMPLANT
COVER SURGICAL LIGHT HANDLE (MISCELLANEOUS) ×2 IMPLANT
COVER TIP SHEARS 8 DVNC (MISCELLANEOUS) ×1 IMPLANT
COVER TIP SHEARS 8MM DA VINCI (MISCELLANEOUS) ×2
CUTTER ECHEON FLEX ENDO 45 340 (ENDOMECHANICALS) IMPLANT
DECANTER SPIKE VIAL GLASS SM (MISCELLANEOUS) ×2 IMPLANT
DERMABOND ADVANCED (GAUZE/BANDAGES/DRESSINGS) ×1
DERMABOND ADVANCED .7 DNX12 (GAUZE/BANDAGES/DRESSINGS) ×1 IMPLANT
DRAIN CHANNEL 15F RND FF 3/16 (WOUND CARE) ×1 IMPLANT
DRAPE ARM DVNC X/XI (DISPOSABLE) ×4 IMPLANT
DRAPE COLUMN DVNC XI (DISPOSABLE) ×1 IMPLANT
DRAPE DA VINCI XI ARM (DISPOSABLE) ×8
DRAPE DA VINCI XI COLUMN (DISPOSABLE) ×2
DRAPE INCISE IOBAN 66X45 STRL (DRAPES) ×2 IMPLANT
DRAPE SHEET LG 3/4 BI-LAMINATE (DRAPES) ×2 IMPLANT
DRSG TEGADERM 4X4.75 (GAUZE/BANDAGES/DRESSINGS) ×2 IMPLANT
ELECT PENCIL ROCKER SW 15FT (MISCELLANEOUS) ×2 IMPLANT
ELECT REM PT RETURN 15FT ADLT (MISCELLANEOUS) ×2 IMPLANT
EVACUATOR SILICONE 100CC (DRAIN) ×1 IMPLANT
GAUZE 4X4 16PLY ~~LOC~~+RFID DBL (SPONGE) ×2 IMPLANT
GLOVE SRG 8 PF TXTR STRL LF DI (GLOVE) ×1 IMPLANT
GLOVE SURG ENC MOIS LTX SZ6.5 (GLOVE) ×2 IMPLANT
GLOVE SURG ENC TEXT LTX SZ7.5 (GLOVE) ×4 IMPLANT
GLOVE SURG UNDER POLY LF SZ8 (GLOVE) ×2
GOWN STRL REUS W/TWL LRG LVL3 (GOWN DISPOSABLE) ×2 IMPLANT
GOWN STRL REUS W/TWL XL LVL3 (GOWN DISPOSABLE) ×4 IMPLANT
HEMOSTAT SURGICEL 4X8 (HEMOSTASIS) ×2 IMPLANT
HOLDER FOLEY CATH W/STRAP (MISCELLANEOUS) ×2 IMPLANT
IRRIG SUCT STRYKERFLOW 2 WTIP (MISCELLANEOUS) ×2
IRRIGATION SUCT STRKRFLW 2 WTP (MISCELLANEOUS) ×1 IMPLANT
KIT BASIN OR (CUSTOM PROCEDURE TRAY) ×2 IMPLANT
KIT TURNOVER KIT A (KITS) IMPLANT
LOOP VESSEL MAXI BLUE (MISCELLANEOUS) IMPLANT
MARKER SKIN DUAL TIP RULER LAB (MISCELLANEOUS) ×2 IMPLANT
NDL INSUFFLATION 14GA 120MM (NEEDLE) ×1 IMPLANT
NEEDLE INSUFFLATION 14GA 120MM (NEEDLE) ×2 IMPLANT
POUCH SPECIMEN RETRIEVAL 10MM (ENDOMECHANICALS) ×2 IMPLANT
PROTECTOR NERVE ULNAR (MISCELLANEOUS) ×4 IMPLANT
RELOAD STAPLE 45 2.6 WHT THIN (STAPLE) IMPLANT
SCISSORS LAP 5X45 EPIX DISP (ENDOMECHANICALS) ×2 IMPLANT
SEAL CANN UNIV 5-8 DVNC XI (MISCELLANEOUS) ×4 IMPLANT
SEAL XI 5MM-8MM UNIVERSAL (MISCELLANEOUS) ×8
SEALANT SURGICAL APPL DUAL CAN (MISCELLANEOUS) IMPLANT
SET TUBE SMOKE EVAC HIGH FLOW (TUBING) ×2 IMPLANT
SOLUTION ELECTROLUBE (MISCELLANEOUS) ×2 IMPLANT
SPONGE DRAIN TRACH 4X4 STRL 2S (GAUZE/BANDAGES/DRESSINGS) ×1 IMPLANT
STAPLE RELOAD 45 WHT (STAPLE) IMPLANT
STAPLE RELOAD 45MM WHITE (STAPLE)
SUT ETHILON 2 0 PS N (SUTURE) ×1 IMPLANT
SUT ETHILON 2 0 PSLX (SUTURE) ×1 IMPLANT
SUT MNCRL AB 4-0 PS2 18 (SUTURE) ×4 IMPLANT
SUT PDS AB 0 CT1 36 (SUTURE) IMPLANT
SUT V-LOC BARB 180 2/0GR6 GS22 (SUTURE)
SUT VIC AB 1 CT1 36 (SUTURE) ×8 IMPLANT
SUT VIC AB 3-0 SH 27 (SUTURE) ×4
SUT VIC AB 3-0 SH 27X BRD (SUTURE) IMPLANT
SUT VICRYL 0 UR6 27IN ABS (SUTURE) IMPLANT
SUT VLOC BARB 180 ABS3/0GR12 (SUTURE) ×4
SUTURE V-LC BRB 180 2/0GR6GS22 (SUTURE) IMPLANT
SUTURE VLOC BRB 180 ABS3/0GR12 (SUTURE) ×2 IMPLANT
TOWEL OR 17X26 10 PK STRL BLUE (TOWEL DISPOSABLE) ×2 IMPLANT
TOWEL OR NON WOVEN STRL DISP B (DISPOSABLE) ×2 IMPLANT
TRAY FOLEY MTR SLVR 16FR STAT (SET/KITS/TRAYS/PACK) ×2 IMPLANT
TRAY LAPAROSCOPIC (CUSTOM PROCEDURE TRAY) ×2 IMPLANT
TROCAR BLADELESS OPT 5 100 (ENDOMECHANICALS) IMPLANT
TROCAR UNIVERSAL OPT 12M 100M (ENDOMECHANICALS) IMPLANT
TROCAR XCEL 12X100 BLDLESS (ENDOMECHANICALS) ×2 IMPLANT
WATER STERILE IRR 1000ML POUR (IV SOLUTION) ×2 IMPLANT

## 2021-02-15 NOTE — Discharge Instructions (Signed)

## 2021-02-15 NOTE — Transfer of Care (Signed)
Immediate Anesthesia Transfer of Care Note  Patient: [name]  Procedure(s) Performed: Procedure(s): XI ROBOTIC ASSITED PARTIAL NEPHRECTOMY (Left)  Patient Location: PACU  Anesthesia Type:General  Level of Consciousness: Patient easily awoken, sedated, comfortable, cooperative, following commands, responds to stimulation.   Airway & Oxygen Therapy: Patient spontaneously breathing, ventilating well, oxygen via simple oxygen mask.  Post-op Assessment: Report given to PACU RN, vital signs reviewed and stable, moving all extremities.   Post vital signs: Reviewed and stable.  Complications: No apparent anesthesia complications Last Vitals:  Vitals Value Taken Time  BP 123/76 02/15/21 1223  Temp    Pulse 77 02/15/21 1229  Resp 22 02/15/21 1229  SpO2 100 % 02/15/21 1229  Vitals shown include unvalidated device data.  Last Pain:  Vitals:   02/15/21 0721  TempSrc:   PainSc: 0-No pain         Complications: No notable events documented.

## 2021-02-15 NOTE — H&P (Signed)
Urology Preoperative H&P   Chief Complaint: left renal mass  History of Present Illness: [name] is a 55 y.o. female with who was found to have a 2.5 cm indeterminate lesion involving the left kidney as well as a 6 mm right AML on CT w/ contrast on 10/17/19 during a work-up of hematuria and low back pain. The left renal mass in question has been present since 2008, but has slowly grown over that time. PMHx of ovarian cancer (2007)--no mets seen on recent CT. s/p excision of vulvar cyst on 10/07/19.  She denies interval episodes of flank pain or hematuria.    Past Medical History:  Diagnosis Date   Cancer ([place]) 2007   HYSTERCTOMY DUE TO CANCER   Chronic kidney disease    Complication of anesthesia    UPSETS STOMACH   Hyperlipidemia    Hypertension    IBS (irritable bowel syndrome)    Morbid obesity ([facility])    PONV (postoperative nausea and vomiting)    Sinus complaint    Urticaria    Wears glasses     Past Surgical History:  Procedure Laterality Date   ABDOMINAL HYSTERECTOMY     COMPLETE   CESAREAN SECTION     2   CHOLECYSTECTOMY  1990   COLONOSCOPY WITH PROPOFOL N/A 08/22/2016   Procedure: COLONOSCOPY WITH PROPOFOL;  Surgeon: Arta Silence, MD;  Location: [facility] ENDOSCOPY;  Service: Endoscopy;  Laterality: N/A;   ESOPHAGEAL MANOMETRY N/A 07/06/2013   Procedure: ESOPHAGEAL MANOMETRY (EM);  Surgeon: Arta Silence, MD;  Location: [facility] ENDOSCOPY;  Service: Endoscopy;  Laterality: N/A;   GASTRIC RESTRICTION SURGERY  2010   lap band   HEMORROIDECTOMY      Allergies:  Allergies  Allergen Reactions   Other     general anesthesia - upsets stomach    Penicillins Hives and Itching    Has patient had a PCN reaction causing immediate rash, facial/tongue/throat swelling, SOB or lightheadedness with hypotension: Yes Has patient had a PCN reaction causing severe rash involving mucus membranes or skin necrosis: Yes Has patient had a PCN reaction that required hospitalization:  No Has patient had a PCN reaction occurring within the last 10 years: No If all of the above answers are "NO", then may proceed with Cephalosporin use.     Family History  Problem Relation Age of Onset   Thyroid cancer Mother    Hypertension Mother    Hyperlipidemia Mother    Asthma Sister    Breast cancer Maternal Aunt    Breast cancer Maternal Aunt    Allergic rhinitis Brother    Angioedema Brother    Eczema Grandson    Immunodeficiency Neg Hx    Urticaria Neg Hx     Social History:  reports that she has never smoked. She has never used smokeless tobacco. She reports that she does not drink alcohol and does not use drugs.  ROS: A complete review of systems was performed.  All systems are negative except for pertinent findings as noted.  Physical Exam:  Vital signs in last 24 hours: Weight:  [131.1 kg] 131.1 kg (12/14 0626) Constitutional:  Alert and oriented, No acute distress Cardiovascular: Regular rate and rhythm, No JVD Respiratory: Normal respiratory effort, Lungs clear bilaterally GI: Abdomen is soft, nontender, nondistended, no abdominal masses GU: No CVA tenderness Lymphatic: No lymphadenopathy Neurologic: Grossly intact, no focal deficits Psychiatric: Normal mood and affect  Laboratory Data:  No results for input(s): WBC, HGB, HCT, PLT in the last 72  hours.  No results for input(s): NA, K, CL, GLUCOSE, BUN, CALCIUM, CREATININE in the last 72 hours.  Invalid input(s): CO3   No results found for this or any previous visit (from the past 24 hour(s)). Recent Results (from the past 240 hour(s))  SARS Coronavirus 2 (TAT 6-24 hrs)     Status: None   Collection Time: 02/13/21 12:00 AM  Result Value Ref Range Status   SARS Coronavirus 2 RESULT: NEGATIVE  Final    Comment: RESULT: NEGATIVESARS-CoV-2 INTERPRETATION:A NEGATIVE  test result means that SARS-CoV-2 RNA was not present in the specimen above the limit of detection of this test. This does not preclude a  possible SARS-CoV-2 infection and should not be used as the  sole basis for patient management decisions. Negative results must be combined with clinical observations, patient history, and epidemiological information. Optimum specimen types and timing for peak viral levels during infections caused by SARS-CoV-2  have not been determined. Collection of multiple specimens or types of specimens may be necessary to detect virus. Improper specimen collection and handling, sequence variability under primers/probes, or organism present below the limit of detection may  lead to false negative results. Positive and negative predictive values of testing are highly dependent on prevalence. False negative test results are more likely when prevalence of disease is high.The expected result is NEGATIVE.Fact S heet for  Healthcare Providers: LocalChronicle.no Sheet for Patients: SalonLookup.es Reference Range - Negative     Renal Function: No results for input(s): CREATININE in the last 168 hours. Estimated Creatinine Clearance: 105.3 mL/min (by C-G formula based on SCr of 0.62 mg/dL).  Radiologic Imaging: LINICAL DATA: Known left renal neoplasm surveillance/re-evaluation.  EXAM: CT ABDOMEN AND PELVIS WITHOUT AND WITH CONTRAST  TECHNIQUE: Multidetector CT imaging of the abdomen and pelvis was performed following the standard protocol before and following the bolus administration of intravenous contrast.  CONTRAST: 85 cc of Omnipaque 350  COMPARISON: Multiple priors including most recent CT October 17, 2019  FINDINGS: Lower chest: No acute abnormality.  Hepatobiliary: No suspicious hepatic lesion. Hepatic steatosis. Gallbladder surgically absent. No biliary ductal dilation.  Pancreas: Within normal limits.  Spleen: Within normal limits.  Adrenals/Urinary Tract: Bilateral adrenal glands are unremarkable.  Enhancing solid lesion in the  left upper pole measures 2.5 x 2.2 cm on image 57/8 previously measuring 2.4 x 2.2 cm, which again has been present over multiple priors dating back to 02/05/2007 but slowly enlarging over that interval. Fat density right upper pole renal lesion measures 9 mm on image 72/5 previously 6 mm.  No hydronephrosis. No renal calculi.  The kidneys demonstrate symmetric enhancement and excretion of contrast. No suspicious filling defect visualized within the opacified portions of the collecting systems or ureters on delayed imaging.  Urinary bladder is unremarkable.  Stomach/Bowel: Similar distal esophageal wall thickening. Prior gastric band surgery with access port in the anterior abdominal wall. No pathologic dilation of small or large bowel in the abdomen. No evidence of acute bowel inflammation.  Vascular/Lymphatic: Patent renal veins without evidence of tumoral thrombus in the left renal vein. No abdominal aortic aneurysm. No pathologically enlarged abdominal or pelvic lymph nodes. Similar misty appearance of the jejunal mesentery with prominent jejunal lymph nodes.  Reproductive: Status post hysterectomy. No adnexal masses.  Other: No significant abdominopelvic ascites.  Musculoskeletal: Thoracolumbar spondylosis. No acute osseous abnormality.  IMPRESSION: 1. Very slight interval increase in size of the 2.5 cm enhancing solid lesion in the left upper pole, which again has been present over  multiple priors dating back to 02/05/2007 but slowly enlarging over that interval, indicating an indolent process and while this may reflect a lipid poor angiomyolipoma an indolent/slow growing renal cell carcinoma is a pertinent differential consideration. 2. No evidence of tumor in vein or abdominopelvic metastases. 3. Slight interval increase in size and in the 9 mm right upper pole angiomyolipoma. 4. Similar distal esophageal wall thickening which is nonspecific and may reflect  esophagitis. 5. Hepatic steatosis. 6. 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   Electronically Signed By: Dahlia Bailiff M.D. On: 11/23/2020 16:22   I independently reviewed the above imaging studies.  Assessment and Plan Beronica Lansdale is a 55 y.o. female with an enlarging, solid and enhancing left upper pole renal mass with features concerning for RCC   -I personally reviewed imaging results and films with the patient. We discussed that the mass in question has features concerning for malignancy. I explained the natural history of presumed renal cell carcinoma. I reviewed the AUA guidelines for evaluation and treatment of the small renal mass. The options of active surveillance, in situ tumor ablation, partial and radical nephrectomy was discussed. The risks of robotic partial nephrectomy were discussed in detail including but not limited to: negative pathology, open conversion, completion nephrectomy, infection of the urinary tract/skin/abdominal cavity, VTE, MI/CVA, lymphatic leak, injury to adjacent solid/hollow viscus organs, lap band injury/infection, bleeding requiring a blood transfusion, catastrophic bleeding, hernia formation, need for postoperative angioembolization, urinary leak requiring stent/drain, and other imponderables.    Ellison Hughs, MD 02/15/2021, 7:36 AM  [facility] Pager: [phone]

## 2021-02-15 NOTE — Anesthesia Postprocedure Evaluation (Signed)
Anesthesia Post Note  Patient: [name]  Procedure(s) Performed: XI ROBOTIC ASSITED PARTIAL NEPHRECTOMY (Left: Abdomen)     Patient location during evaluation: PACU Anesthesia Type: General Level of consciousness: awake and alert Pain management: pain level controlled Vital Signs Assessment: post-procedure vital signs reviewed and stable Respiratory status: spontaneous breathing, nonlabored ventilation, respiratory function stable and patient connected to nasal cannula oxygen Cardiovascular status: blood pressure returned to baseline and stable Postop Assessment: no apparent nausea or vomiting Anesthetic complications: no   No notable events documented.  Last Vitals:  Vitals:   02/15/21 1250 02/15/21 1315  BP:  123/80  Pulse:  64  Resp:  10  Temp:    SpO2: 92% 97%    Last Pain:  Vitals:   02/15/21 1315  TempSrc:   PainSc: 0-No pain                 Bonnell Placzek,W. EDMOND

## 2021-02-15 NOTE — Anesthesia Procedure Notes (Signed)
Procedure Name: Intubation Date/Time: 02/15/2021 8:36 AM Performed by: Deliah Boston, CRNA Pre-anesthesia Checklist: Patient identified, Emergency Drugs available, Suction available and Patient being monitored Patient Re-evaluated:Patient Re-evaluated prior to induction Oxygen Delivery Method: Circle system utilized Preoxygenation: Pre-oxygenation with 100% oxygen Induction Type: IV induction Ventilation: Mask ventilation without difficulty Laryngoscope Size: Mac and 4 Grade View: Grade I Tube type: Oral Tube size: 7.5 mm Number of attempts: 1 Airway Equipment and Method: Stylet Placement Confirmation: ETT inserted through vocal cords under direct vision, positive ETCO2 and breath sounds checked- equal and bilateral Secured at: 21 cm Tube secured with: Tape Dental Injury: Teeth and Oropharynx as per pre-operative assessment

## 2021-02-15 NOTE — Op Note (Signed)
Operative Note  Preoperative diagnosis:  1.  2.5 cm left renal mass  Postoperative diagnosis: 1.  2.5 cm left renal mass  Procedure(s): 1.  Robot-assisted laparoscopic left partial nephrectomy 2.  Intraoperative ultrasound of single retroperitoneal organ  Surgeon: Ellison Hughs, MD  Assistants: Debbrah Alar, PA-C  Anesthesia:  General  Complications:  None  EBL: 120 mL  Specimens: 1.  Left renal mass  Drains/Catheters: 1.  Foley catheter 2.  Left lower quadrant JP drain  Intraoperative findings:   Left upper pole renal mass with heterogenous echogenicity Grossly negative margins following excision of left renal mass 1 cm area of collecting system injury Renorrhaphy was hemostatic at the conclusion of the case  Indication:  [name] is a 55 y.o. female with a solid enhancing 2.5 cm left renal mass with features concerning for renal cell carcinoma.  She has been consented for the above procedures, voices understanding and wishes to proceed.  Description of procedure:  After informed consent was obtained, the patient was brought to the operating room and general endotracheal anesthesia was administered.  The patient was then placed in the right lateral decubitus position and prepped and draped in usual sterile fashion.  A timeout was performed.  An 8 mm incision was then made lateral to the left rectus muscle at the level of the left 12th rib.  Abdominal access was obtained via a Veress needle.  The abdominal cavity was then insufflated up to 15 mmHg.  An 8 mm port was then introduced into the abdominal cavity.  Inspection of the port entry site by the robotic camera revealed no adjacent organ injury.  We then placed 3 additional 8 mm robotic ports to triangulate the left renal hilum.  A 12 mm assistant port was then placed between the carmera port and 3rd robotic arm.  The white line of Toldt along the descending colon was incised sharply and the colon, along  with its mesocolonic fat, was reflected medially until the aorta was identified.  We then made a small window adjacent to the lower pole of the left kidney, identifying the left psoas muscle, left ureter and left gonadal vein.  The left ureter and gonadal vein were then reflected anteriorly allowing [place] to then incised the perihilar attachments using electrocautery.  We encountered a small lumbar vein adjacent to the insertion of the left gonadal vein into the left renal vein.  This lumbar vein was ligated with hemo-lock clips in 2 places and incised sharply.  This provided [place] excellent exposure to the left renal hilum.  The perilymphatic tissue surrounding the left renal artery was carefully dissected away, creating a window to place a bulldog clamp later in the procedure.  The anterior portion of Gerota's fascia was incised, allowing reflection the perinephric fat medially and laterally until there was adequate exposure of the upper pole left renal mass.  Intraoperative ultrasound confirmed the heterogenous echogenicity of the lesion compared to the remainder of the renal parenchyma and allowed identification of the depth/borders of the mass, which were demarcated using electrocautery along the renal capsule.  We then exposed the left renal artery and placed a bulldog clamp, marking warm ischemia time.  The left kidney immediately became ischemic and pale in appearance.  The left renal mass was then sharply excised with minimal blood loss.  After the mass was free, it was placed in the left upper quadrant to be retrieved later on during the operation.  There was a 1 cm area of  collecting system entry that was repaired with a running 3-0 Vicryl suture.  The renorrhaphy was then performed using a 3-0 V-lock in the deep layer of the renal parenchyma and then a series of interrupted 1-0 Vicryl suture was used to close the renal capsule with hemo-lock clips act as a buttress against the renal capsule.  Once adequate  tension was placed on the renorrhaphy sutures and the repair appeared hemostatic, we then removed the bulldog clamp marking the end of warm ischemia time at 21 minutes.  There did not appear to be any obvious bleeding around the renal hilum nor surrounding our repair.  The incised Gerota's fascia overlying the mass was then reapproximated using a running 2-0 V lock suture.  The mass was then placed in an Endo Catch bag.  The robot was then de-docked and the camera was then reinserted into the assistant port. Laparoscopic graspers were then used to grab the string of the Endo Catch bag, which was brought out through the 12 mm assistant port.  The abdomen was then desufflated and all ports were removed.  The assistant port incision was then extended approximately 1 cm and the left renal mass, within the Endo Catch bag, was removed and sent to pathology for permanent section.  The fascia within the assistant port incision was then reapproximated using a 0 Vicryl suture.  The remainder of the incisions were then closed using 4-0 Monocryl and dressed appropriately.  Patient tolerated the procedure well and was transferred to the postanesthesia unit in stable condition.  Plan: Bedrest on the floor overnight.  Monitor JP and urine output

## 2021-02-16 ENCOUNTER — Encounter (HOSPITAL_COMMUNITY): Payer: Self-pay | Admitting: Urology

## 2021-02-16 DIAGNOSIS — D1771 Benign lipomatous neoplasm of kidney: Secondary | ICD-10-CM | POA: Diagnosis not present

## 2021-02-16 LAB — HEMOGLOBIN AND HEMATOCRIT, BLOOD
HCT: 34.9 % — ABNORMAL LOW (ref 36.0–46.0)
Hemoglobin: 11.6 g/dL — ABNORMAL LOW (ref 12.0–15.0)

## 2021-02-16 LAB — BASIC METABOLIC PANEL
Anion gap: 10 (ref 5–15)
BUN: 10 mg/dL (ref 6–20)
CO2: 27 mmol/L (ref 22–32)
Calcium: 9 mg/dL (ref 8.9–10.3)
Chloride: 100 mmol/L (ref 98–111)
Creatinine, Ser: 0.74 mg/dL (ref 0.44–1.00)
GFR, Estimated: >60 mL/min (ref >60)
Glucose, Bld: 132 mg/dL — ABNORMAL HIGH (ref 70–99)
Potassium: 3.7 mmol/L (ref 3.5–5.1)
Sodium: 137 mmol/L (ref 135–145)

## 2021-02-16 LAB — CREATININE, FLUID (PLEURAL, PERITONEAL, JP DRAINAGE): Creat, Fluid: 0.8 mg/dL

## 2021-02-16 LAB — SURGICAL PATHOLOGY

## 2021-02-16 NOTE — Progress Notes (Signed)
°  Transition of Care [facility]) Screening Note   Patient Details  Name: [name] Date of Birth: [birth year]   Transition of Care [facility]) CM/SW Contact:    Dessa Phi, RN Phone Number: 02/16/2021, 4:00 PM    Transition of Care Department [facility]) has reviewed patient and no TOC needs have been identified at this time. We will continue to monitor patient advancement through interdisciplinary progression rounds. If new patient transition needs arise, please place a TOC consult.

## 2021-02-16 NOTE — Plan of Care (Signed)
°  Problem: Education: Goal: Knowledge of General Education information will improve Description: Including pain rating scale, medication(s)/side effects and non-pharmacologic comfort measures 02/16/2021 1528 by Saunders Glance, RN Outcome: Adequate for Discharge 02/16/2021 1154 by Saunders Glance, RN Outcome: Progressing   Problem: Health Behavior/Discharge Planning: Goal: Ability to manage health-related needs will improve Outcome: Adequate for Discharge   Problem: Clinical Measurements: Goal: Ability to maintain clinical measurements within normal limits will improve Outcome: Adequate for Discharge Goal: Will remain free from infection Outcome: Adequate for Discharge Goal: Diagnostic test results will improve Outcome: Adequate for Discharge Goal: Respiratory complications will improve Outcome: Adequate for Discharge Goal: Cardiovascular complication will be avoided Outcome: Adequate for Discharge   Problem: Activity: Goal: Risk for activity intolerance will decrease Outcome: Adequate for Discharge   Problem: Nutrition: Goal: Adequate nutrition will be maintained Outcome: Adequate for Discharge   Problem: Coping: Goal: Level of anxiety will decrease Outcome: Adequate for Discharge   Problem: Elimination: Goal: Will not experience complications related to bowel motility Outcome: Adequate for Discharge Goal: Will not experience complications related to urinary retention Outcome: Adequate for Discharge   Problem: Pain Managment: Goal: General experience of comfort will improve Outcome: Adequate for Discharge   Problem: Safety: Goal: Ability to remain free from injury will improve Outcome: Adequate for Discharge   Problem: Skin Integrity: Goal: Risk for impaired skin integrity will decrease Outcome: Adequate for Discharge   Problem: Education: Goal: Knowledge of the prescribed therapeutic regimen will improve Outcome: Adequate for Discharge   Problem:  Bowel/Gastric: Goal: Gastrointestinal status for postoperative course will improve Outcome: Adequate for Discharge   Problem: Clinical Measurements: Goal: Postoperative complications will be avoided or minimized Outcome: Adequate for Discharge   Problem: Respiratory: Goal: Ability to achieve and maintain a regular respiratory rate will improve Outcome: Adequate for Discharge   Problem: Skin Integrity: Goal: Demonstration of wound healing without infection will improve Outcome: Adequate for Discharge   Problem: Urinary Elimination: Goal: Ability to avoid or minimize complications of infection will improve Outcome: Adequate for Discharge Goal: Ability to achieve and maintain urine output will improve Outcome: Adequate for Discharge

## 2021-02-16 NOTE — Progress Notes (Signed)
°   02/16/21 1400  Mobility  Activity Ambulated in hall  Level of Assistance Modified independent, requires aide device or extra time  Assistive Device None  Distance Ambulated (ft) 500 ft  Mobility Ambulated independently in hallway  Mobility Response Tolerated well  Mobility performed by Mobility specialist  $Mobility charge 1 Mobility   Pt agreeable to mobilize this afternoon. Ambulated about 576ft in hall, tolerated well. Once back to room, taught pt to use log roll technique. Left pt in bed with call bell at side.  [place] Specialist Acute Rehab Services Office: [phone]

## 2021-02-16 NOTE — Plan of Care (Signed)
  Problem: Education: Goal: Knowledge of General Education information will improve Description Including pain rating scale, medication(s)/side effects and non-pharmacologic comfort measures Outcome: Progressing   

## 2021-02-16 NOTE — Progress Notes (Signed)
Patient given discharge, follow up, and medication instructions, verbalized understanding, IV removed, personal belongings with patient, family to transport home  

## 2021-02-16 NOTE — Plan of Care (Signed)
°  Problem: Education: Goal: Knowledge of General Education information will improve Description: Including pain rating scale, medication(s)/side effects and non-pharmacologic comfort measures Outcome: Progressing   Problem: Coping: Goal: Level of anxiety will decrease Outcome: Progressing   Problem: Elimination: Goal: Will not experience complications related to urinary retention Outcome: Progressing   Problem: Pain Managment: Goal: General experience of comfort will improve Outcome: Progressing   Problem: Safety: Goal: Ability to remain free from injury will improve Outcome: Progressing   Problem: Skin Integrity: Goal: Risk for impaired skin integrity will decrease Outcome: Progressing   Problem: Respiratory: Goal: Ability to achieve and maintain a regular respiratory rate will improve Outcome: Progressing   Problem: Urinary Elimination: Goal: Ability to achieve and maintain urine output will improve Outcome: Progressing

## 2021-02-16 NOTE — Discharge Summary (Signed)
Date of admission: 02/15/2021  Date of discharge: 02/16/2021  Admission diagnosis: left renal mass  Discharge diagnosis: same  Secondary diagnoses: CKD, hyperlipidemia, HTN, IBS, ovarian CA  History and Physical: For full details, please see admission history and physical. Briefly, [name] is a 55 y.o. 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 The left renal mass in question has been present since 2008, but has slowly grown over that time. PMHx of ovarian cancer (2007)--no mets seen on recent CT. s/p excision of vulvar cyst on 10/07/19.  She denies interval episodes of flank pain or hematuria  Hospital Course: Pt was admitted and taken to the OR on 02/15/21 for a robotic assisted left lap partial nephrectomy. Pt tolerated the procedure well and was hemodynamically stable throughout.  She was extubated without complication and woke up from anesthesia neurologically intact.  She was transferred from the OR to PACU and then to the floor without issue. Her post op course has progressed as expected.  She remained hemodynamically stable. On POD 1 she was able to ambulate, void after foley removal, and tolerate a regular diet.  She was passing flatus.  Pain was well controlled on PO meds. JP Cr drainage was consistent with serum and drain was removed. She was felt stable for d/c home.   Laboratory values:  Recent Labs    02/15/21 1414 02/16/21 0443  HGB 11.6* 11.6*  HCT 35.8* 34.9*   Recent Labs    02/16/21 0443  CREATININE 0.74    Disposition: Home  Discharge instruction: The patient was instructed to be ambulatory but told to refrain from heavy lifting, strenuous activity, or driving.   Discharge medications:  Allergies as of 02/16/2021       Reactions   Other    general anesthesia - upsets stomach    Penicillins  Hives, Itching   Has patient had a PCN reaction causing immediate rash, facial/tongue/throat swelling, SOB or lightheadedness with hypotension: Yes Has patient had a PCN reaction causing severe rash involving mucus membranes or skin necrosis: Yes Has patient had a PCN reaction that required hospitalization: No Has patient had a PCN reaction occurring within the last 10 years: No If all of the above answers are "NO", then may proceed with Cephalosporin use.        Medication List     STOP taking these medications    benzonatate 100 MG capsule Commonly known as: TESSALON   predniSONE 20 MG tablet Commonly known as: DELTASONE   Vitamin D3 125 MCG (5000 UT) Caps       TAKE these medications    albuterol 108 (90 Base) MCG/ACT inhaler Commonly known as: VENTOLIN HFA 2 puffs every 4-6 hours as needed for cough/wheeze/shortness of breath.chest tightness   docusate sodium 100 MG capsule Commonly known as: COLACE Take 1 capsule (100 mg total) by mouth 2 (two) times daily.   EPINEPHrine 0.3 mg/0.3 mL Soaj injection Commonly known as: EPI-PEN Inject 0.3 mg into the muscle as needed for anaphylaxis.   hydrochlorothiazide 25 MG tablet Commonly known as: HYDRODIURIL take 25mg  by mouth once daily   HYDROcodone-acetaminophen 5-325 MG tablet Commonly known as: Norco Take 1-2 tablets by mouth every 6 (six) hours as needed for moderate pain.   ipratropium 0.06 % nasal spray Commonly known as: ATROVENT 2  spray each nostril 1-2 times daily. What changed:  how much to take how to take this when to take this reasons to take this additional instructions   promethazine 12.5 MG tablet Commonly known as: PHENERGAN Take 1 tablet (12.5 mg total) by mouth every 4 (four) hours as needed for nausea or vomiting.   Saxenda 18 MG/3ML Sopn Generic drug: Liraglutide -Weight Management Inject into the skin.        Followup:   Follow-up Information     Ceasar Mons, MD  Follow up on 03/02/2021.   Specialty: Urology Why: at 8:15 Contact information: [address] [place] [address] [phone]

## 2021-02-16 NOTE — Progress Notes (Signed)
1 Day Post-Op Subjective: Patient reports good pain control.  Nausea last night but none this am. No vomiting.  Passing flatus. Good UO. JP output appropriate. Using IS.  Objective: Vital signs in last 24 hours: Temp:  [96.4 F (35.8 C)-98.2 F (36.8 C)] 97.9 F (36.6 C) (12/15 0816) Pulse Rate:  [64-90] 75 (12/15 0816) Resp:  [10-20] 14 (12/15 0816) BP: (107-137)/(74-85) 129/79 (12/15 0816) SpO2:  [91 %-100 %] 97 % (12/15 0816)  Intake/Output from previous day: 12/14 0701 - 12/15 0700 In: 3852.1 [P.O.:360; I.V.:3142.1; IV Piggyback:350] Out: 1660 [Urine:3325; Drains:145; Blood:120] Intake/Output this shift: No intake/output data recorded.  Physical Exam:  General:alert, cooperative, and no distress Cardiovascular: RRR Lungs: non labored breathing GI: soft; ND, NT; faint BS Incisions: C/D/I Urine:clear Extremities: SCDs in place  Lab Results: Recent Labs    02/15/21 1414 02/16/21 0443  HGB 11.6* 11.6*  HCT 35.8* 34.9*   BMET Recent Labs    02/16/21 0443  NA 137  K 3.7  CL 100  CO2 27  GLUCOSE 132*  BUN 10  CREATININE 0.74  CALCIUM 9.0   No results for input(s): LABPT, INR in the last 72 hours. No results for input(s): LABURIN in the last 72 hours. Results for orders placed or performed in visit on 02/13/21  SARS Coronavirus 2 (TAT 6-24 hrs)     Status: None   Collection Time: 02/13/21 12:00 AM  Result Value Ref Range Status   SARS Coronavirus 2 RESULT: NEGATIVE  Final    Comment: RESULT: NEGATIVESARS-CoV-2 INTERPRETATION:A NEGATIVE  test result means that SARS-CoV-2 RNA was not present in the specimen above the limit of detection of this test. This does not preclude a possible SARS-CoV-2 infection and should not be used as the  sole basis for patient management decisions. Negative results must be combined with clinical observations, patient history, and epidemiological information. Optimum specimen types and timing for peak viral levels during infections  caused by SARS-CoV-2  have not been determined. Collection of multiple specimens or types of specimens may be necessary to detect virus. Improper specimen collection and handling, sequence variability under primers/probes, or organism present below the limit of detection may  lead to false negative results. Positive and negative predictive values of testing are highly dependent on prevalence. False negative test results are more likely when prevalence of disease is high.The expected result is NEGATIVE.Fact S heet for  Healthcare Providers: LocalChronicle.no Sheet for Patients: SalonLookup.es Reference Range - Negative     Studies/Results: No results found.  Assessment/Plan: 1 Day Post-Op, Procedure(s) (LRB): XI ROBOTIC ASSITED PARTIAL NEPHRECTOMY (Left)  Doing very well  All labs stable D/c foley with TOV Ambulate, Incentive spirometry DVT prophylaxis Transition to PO pain medications Check drain creatinine level SL IVF Advance diet If JP Cr consistent with serum will d/c Poss d/c home later today   LOS: 0 days   Debbrah Alar 02/16/2021, 8:18 AM

## 2021-03-13 ENCOUNTER — Ambulatory Visit (INDEPENDENT_AMBULATORY_CARE_PROVIDER_SITE_OTHER): Payer: BC Managed Care – PPO

## 2021-03-13 ENCOUNTER — Other Ambulatory Visit: Payer: Self-pay

## 2021-03-13 DIAGNOSIS — Z23 Encounter for immunization: Secondary | ICD-10-CM

## 2021-05-12 IMAGING — CT CT ABD-PELV W/ CM
1 of 3 series · 13 of 32 positions shown, 18 images · IV contrast (APPLIED)
Comparison: 11/30/2013

CLINICAL DATA: Diagnosed with ovarian cancer in 4993. Bilateral
oophorectomy. Pelvic pain.

EXAM:
CT ABDOMEN AND PELVIS WITH CONTRAST
TECHNIQUE: Multidetector CT imaging of the abdomen and pelvis was performed
using the standard protocol following bolus administration of
intravenous contrast.
CONTRAST:  125mL [id number] IOPAMIDOL ([id number]) INJECTION 61%

[Series 2: abd/pelvis w/cm · axial · 0.98mm/px · z∈[-485,-45]mm · 13 of 102 slices shown, 18 images]
[im 7/102  soft-tissue]
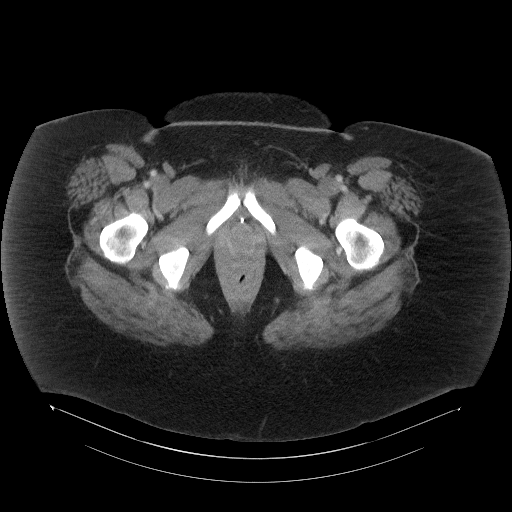
[im 7/102  bone]
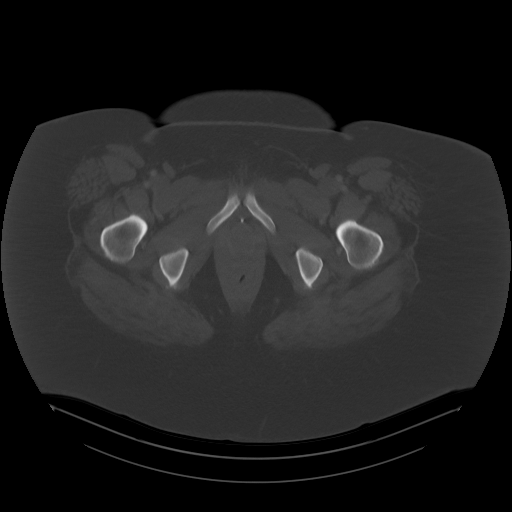
[im 14/102  soft-tissue]
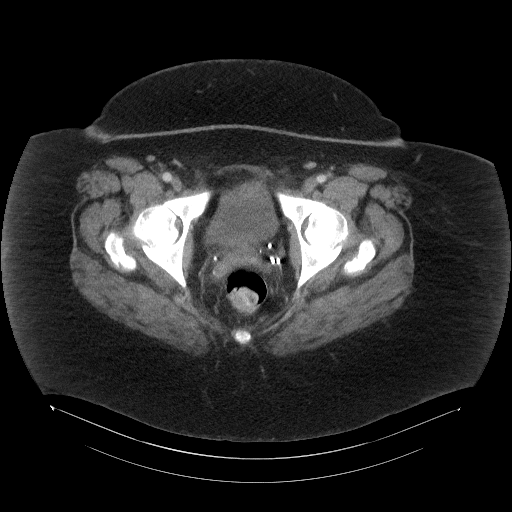
[im 21/102  soft-tissue]
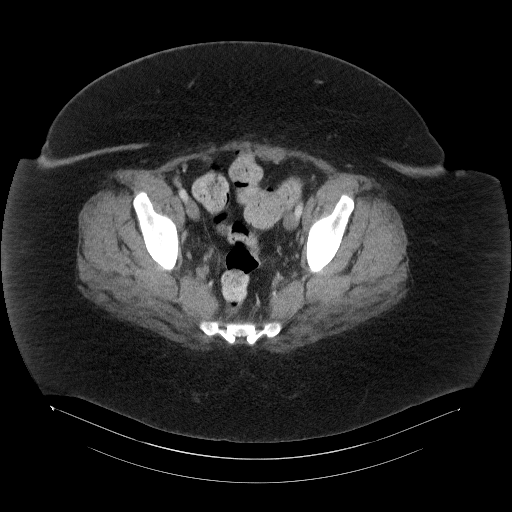
[im 34/102  soft-tissue]
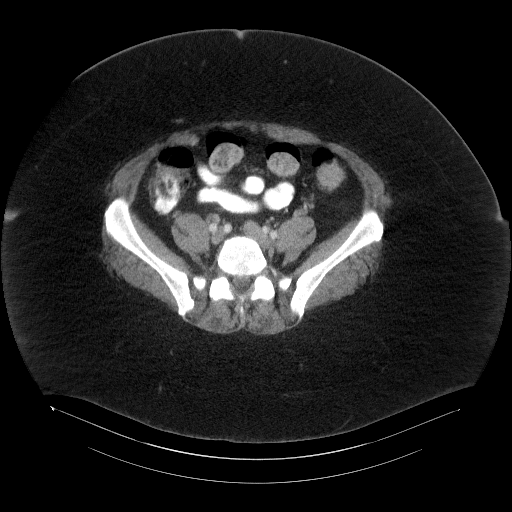
[im 41/102  soft-tissue]
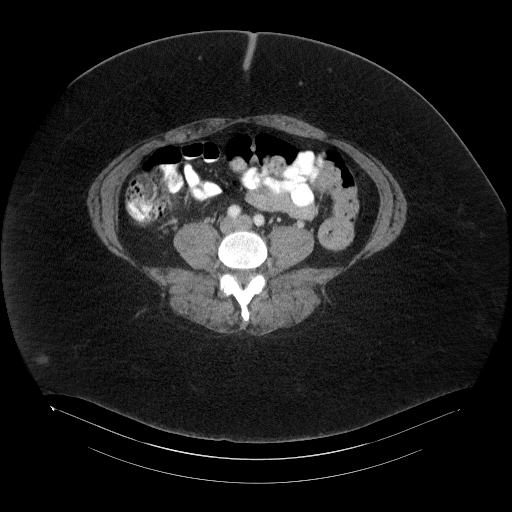
[im 48/102  soft-tissue]
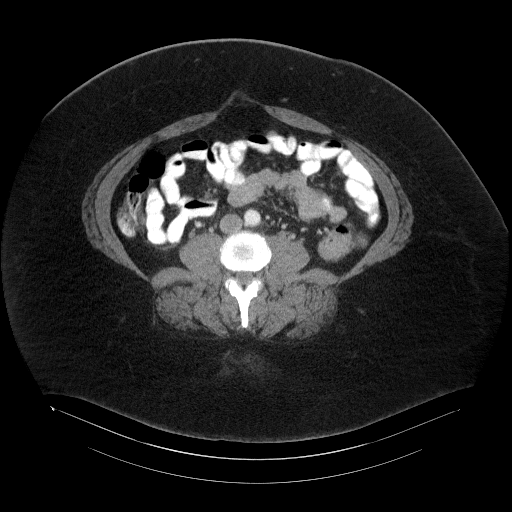
[im 54/102  soft-tissue]
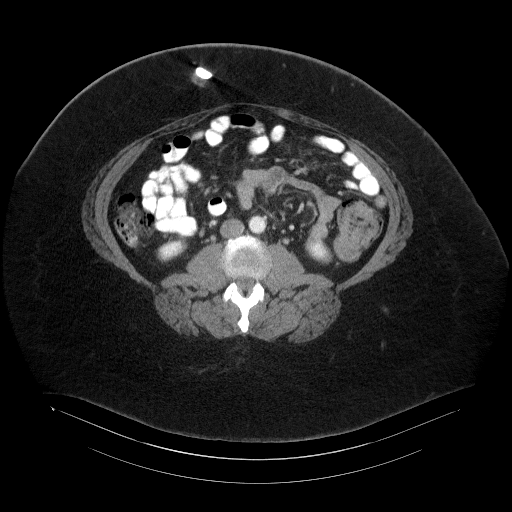
[im 61/102  soft-tissue]
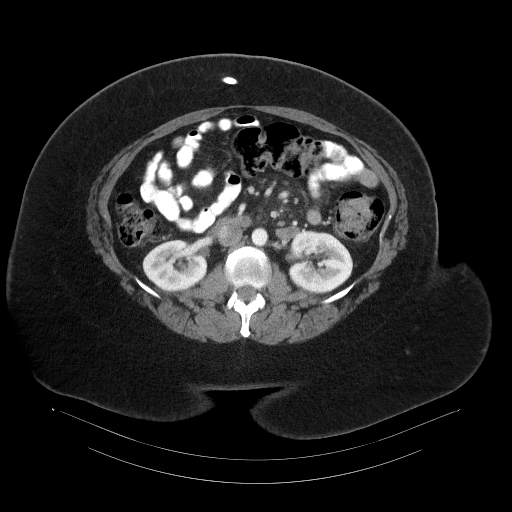
[im 68/102  soft-tissue]
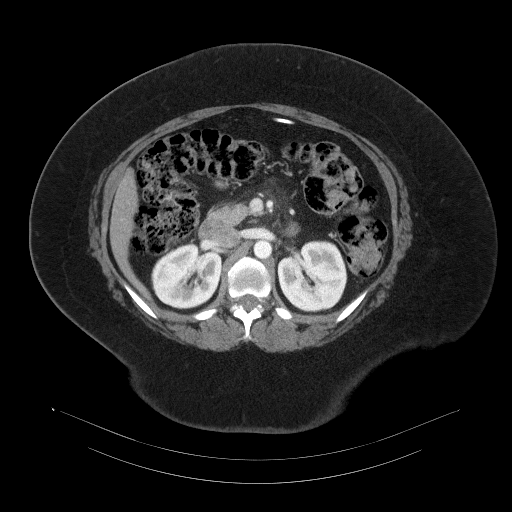
[im 68/102  bone]
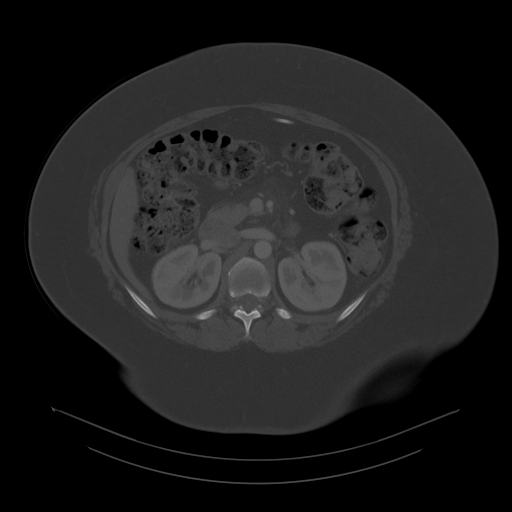
[im 75/102  lung]
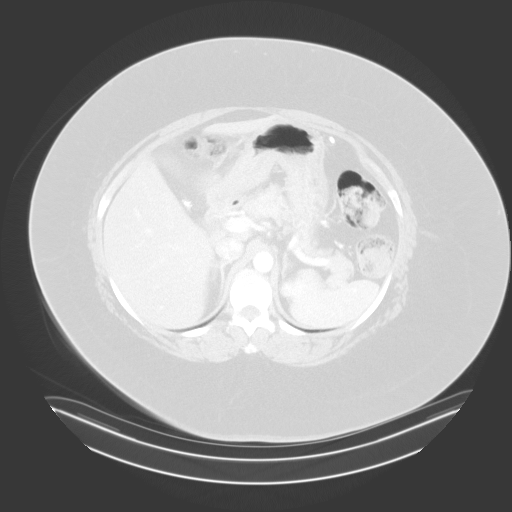
[im 81/102  soft-tissue]
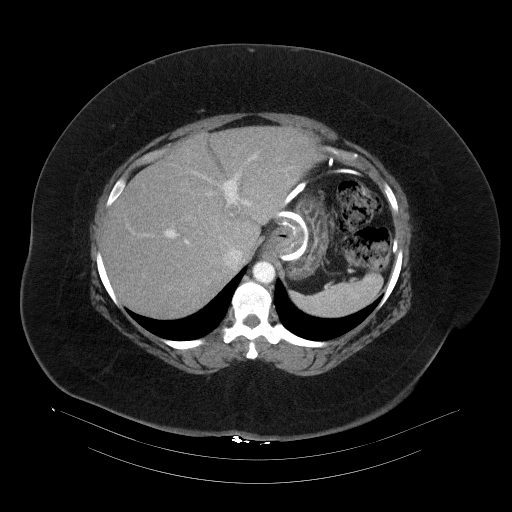
[im 81/102  lung]
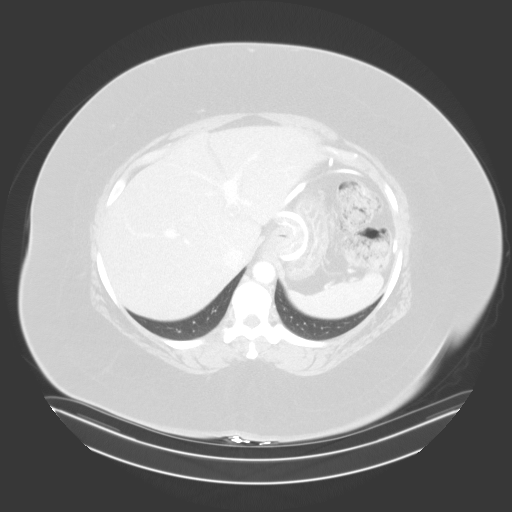
[im 88/102  soft-tissue]
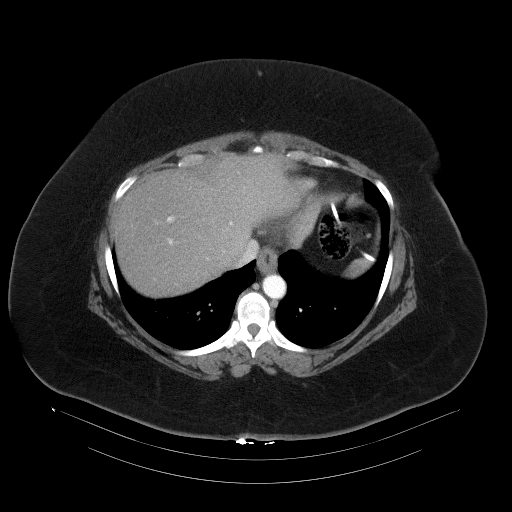
[im 88/102  lung]
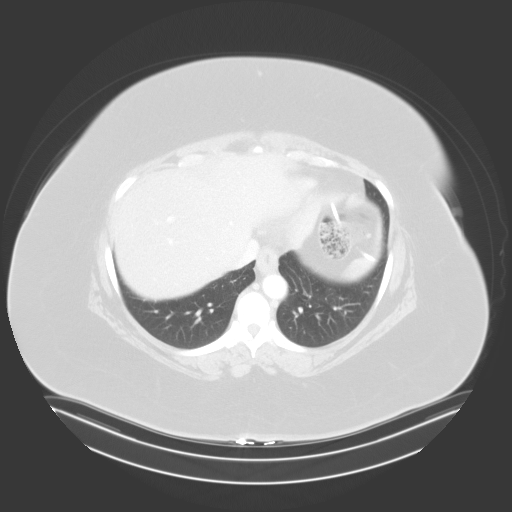
[im 95/102  soft-tissue]
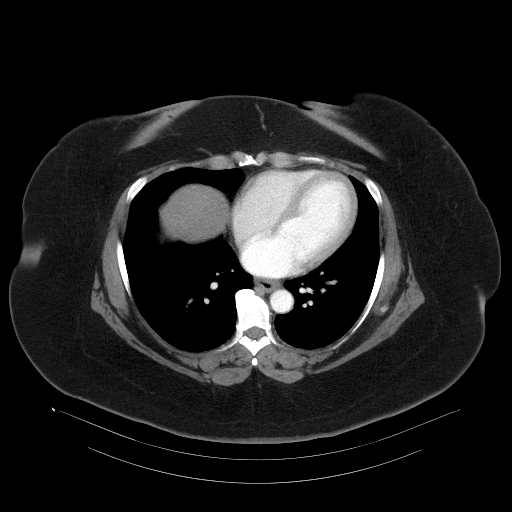
[im 95/102  lung]
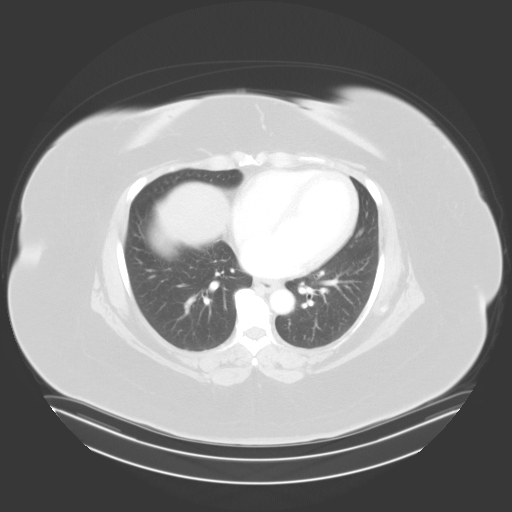

[13 of 32 positions shown; findings below may reference images not displayed]

FINDINGS: Lower chest: Clear lung bases. Normal heart size without pericardial
or pleural effusion. Mild distal esophageal wall thickening.

Hepatobiliary: Mild hepatic steatosis. No focal liver lesion.
Cholecystectomy, without biliary ductal dilatation.

Pancreas: Normal, without mass or ductal dilatation.

Spleen: Normal in size, without focal abnormality.

Adrenals/Urinary Tract: Normal adrenal glands. Upper pole fat
density right renal lesion of 6 mm is consistent with an
angiomyolipoma. A smaller interpolar right renal lesion is too small
to characterize.

An upper pole left renal lesion measures 2.4 x 2.2 cm on [DATE].
Demonstrates deenhancement on delayed images, including [DATE]. This
has enlarged, but has been present on serial exams back to
02/05/2007.

No hydronephrosis.  Normal urinary bladder.

Stomach/Bowel: Status post lap band procedure, without acute
complication. Colonic stool burden suggests constipation. Normal
terminal ileum and appendix. Normal small bowel.

Vascular/Lymphatic: Normal caliber of the aorta and branch vessels.
No abdominal adenopathy. Small nodes in the small bowel mesenteric
with increased density in the mesenteric fat, chronic. No pelvic
sidewall adenopathy.

Reproductive: Hysterectomy.  No adnexal mass.

Other: No significant free fluid. No evidence of omental or
peritoneal disease.

Musculoskeletal: Midthoracic spondylosis.
IMPRESSION: 1. Status post hysterectomy and bilateral oophorectomy. No acute
process or evidence of metastatic disease in the chest.
2. Upper pole left renal lesion, slowly enlarging but present back
more than 12 years. This suggests a benign or at least extremely
indolent solid mass. Possibly a lipid poor angiomyolipoma. This
could be followed with ultrasound or a more aggressive approach
would include urology consultation for imaging surveillance.
3. 6 mm right renal angiomyolipoma.
4.  Possible constipation.
5. Distal esophageal wall thickening, suggesting esophagitis.
6. Hepatic steatosis.
7. Small bowel mesenteric findings which are likely related to
mesenteric adenitis/panniculitis. Chronic and of indeterminate
clinical significance.

These results will be called to the ordering clinician or
representative by the Radiologist Assistant, and communication
documented in the PACS or [REDACTED].

## 2021-08-08 ENCOUNTER — Encounter: Payer: Self-pay | Admitting: *Deleted

## 2021-09-05 ENCOUNTER — Other Ambulatory Visit: Payer: Self-pay | Admitting: Student

## 2021-09-05 DIAGNOSIS — I10 Essential (primary) hypertension: Secondary | ICD-10-CM

## 2021-09-12 ENCOUNTER — Ambulatory Visit: Payer: BC Managed Care – PPO | Admitting: Student

## 2021-09-12 ENCOUNTER — Other Ambulatory Visit: Payer: Self-pay

## 2021-09-12 ENCOUNTER — Encounter: Payer: Self-pay | Admitting: Student

## 2021-09-12 VITALS — BP 139/86 | HR 93 | Wt 292.8 lb

## 2021-09-12 DIAGNOSIS — E785 Hyperlipidemia, unspecified: Secondary | ICD-10-CM | POA: Diagnosis not present

## 2021-09-12 MED ORDER — ATORVASTATIN CALCIUM 40 MG PO TABS: 40.0000 mg | ORAL_TABLET | Freq: Every day | ORAL | 2 refills | Status: DC

## 2021-09-12 NOTE — Progress Notes (Signed)
    SUBJECTIVE:   CHIEF COMPLAINT / HPI:   Patient presents today for elevated cholesterol and LDL at a recent lab done at [facility].  Cholesterol was 282 and LDL of 196.  Patient has a history of hyperlipidemia and not on any medication.  She reports being on regular diet with minimal exercise or activities.  She said she rarely drink alcohol. Per patient she only had a protein shake the morning of her lab draw. Patient denies any chest pain, or dyspnea. Expressed desire to have her lipids under control.  Patient report her ferritin was elevated to 417 in same lab work. She denies being on any iron supplement. No abdominal pain or brownish coloring of skin. No hx of DM or liver disease.    PERTINENT  PMH / PSH: HLD, Obesity, HTN  OBJECTIVE:   BP 139/86   Pulse 93   Wt 292 lb 12.8 oz (132.8 kg)   SpO2 97%   BMI 49.48 kg/m    Physical Exam General: Alert, well appearing, NAD Cardiovascular: RRR, No Murmurs, Normal S2/S2 Respiratory: CTAB, No wheezing or Rales Abdomen: No distension or tenderness  ASSESSMENT/PLAN:   HLD (hyperlipidemia) Most recent cholesterol and LDL 2 weeks ago were elevated at 282 and 196 respectively.  She is on regular diet with minimal activities.  History of hyperlipidemia but not on any medications.  Started patient on atorvastatin 40 mg daily with plan to follow-up in 4 weeks to reassess labs.  Discussed need for life style changes including  exercise and diet modification to improve cholesterol levels.  Patient is amendable to plan  Elevated ferritin Patient's most recent ferritin from 2 weeks ago was 417. She is not on iron supplement. Denies any history of liver disease and most recent CMP showed normal levels of transaminase.  Ddx include hemochromatosis although less likely given the absence of brownish skin coloration, diabetes or liver disease. Also in consideration include chronic dieases such as CKD.  She is currently asymptomatic, plan for iron study  and liver function panel at follow-up visit in 4 weeks.   Alen Bleacher, MD [place]

## 2021-09-12 NOTE — Patient Instructions (Addendum)
It was wonderful to see you today. Thank you for allowing me to be a part of your care. Below is a short summary of what we discussed at your visit today:  Sent in prescription for atorvastatin 40 mg daily.  Like to see you in 4 weeks for follow up labs. We will check your iron levels, liver function and lipid levels at the follow up visit   If you have any questions or concerns, please do not hesitate to contact us via phone or MyChart message.   Alen Bleacher, MD [facility]

## 2021-09-12 NOTE — Assessment & Plan Note (Addendum)
Most recent cholesterol and LDL 2 weeks ago were elevated at 282 and 196 respectively.  She is on regular diet with minimal activities.  History of hyperlipidemia but not on any medications.  Started patient on atorvastatin 40 mg daily with plan to follow-up in 4 weeks to reassess labs.  Discussed need for life style changes including  exercise and diet modification to improve cholesterol levels.  Patient is amendable to plan

## 2021-10-18 ENCOUNTER — Ambulatory Visit: Payer: BC Managed Care – PPO | Admitting: Student

## 2021-10-18 ENCOUNTER — Encounter: Payer: Self-pay | Admitting: Student

## 2021-10-18 VITALS — BP 118/79 | HR 80 | Ht 64.5 in | Wt 293.2 lb

## 2021-10-18 DIAGNOSIS — E785 Hyperlipidemia, unspecified: Secondary | ICD-10-CM | POA: Diagnosis not present

## 2021-10-18 DIAGNOSIS — R7989 Other specified abnormal findings of blood chemistry: Secondary | ICD-10-CM

## 2021-10-18 NOTE — Progress Notes (Signed)
    SUBJECTIVE:   CHIEF COMPLAINT / HPI:   56 year old female with history of LD who presents for follow-up after starting treatment on  atorvastatin.  She reported initially developing generalized rashes and with the medication which she said has since improved with benadryl. There are no new rashes.  She denies any pain, abdominal pain or changes in bowel habits.  Endorses compliance with medication.  She is aware of the importance of diet and exercises.  Reports exercising is difficult due to her left knee pain from her arthritis.    PERTINENT  PMH / PSH: HLD, Diabetes   OBJECTIVE:   BP 118/79   Pulse 80   Ht 5' 4.5" (1.638 m)   Wt 293 lb 3.2 oz (133 kg)   SpO2 100%   BMI 49.55 kg/m    Physical Exam General: Alert, well appearing, NAD Cardiovascular: RRR, No Murmurs, Normal S2/S2 Respiratory: CTAB, No wheezing or Rales Extremities: No edema on extremities   Skin: Healing papular rashes on extremities   ASSESSMENT/PLAN:   HLD (hyperlipidemia) Patient endorses tolerance to her high intensity statin. Initially reported rash that is improving. Through shared decision making patient would like to continue her current dose of atorvastatin 40 mg daily, with plan to decreased dose if she continues to have rashes. No myalgia at this time. -Continue atorvastatin 40 mg daily -Obtained labs for LDL and ferritin.      Alen Bleacher, MD [place]

## 2021-10-18 NOTE — Patient Instructions (Signed)
It was wonderful to see you today. Thank you for allowing me to be a part of your care. Below is a short summary of what we discussed at your visit today:  Will collect lab for your lipids and ferritin (iron) today. I will call you if the result is abnormal.  If you have any questions or concerns, please do not hesitate to contact us via phone or MyChart message.   Alen Bleacher, MD [facility]

## 2021-10-18 NOTE — Assessment & Plan Note (Signed)
Patient endorses tolerance to her high intensity statin. Initially reported rash that is improving. Through shared decision making patient would like to continue her current dose of atorvastatin 40 mg daily, with plan to decreased dose if she continues to have rashes. No myalgia at this time. -Continue atorvastatin 40 mg daily -Obtained labs for LDL and ferritin.

## 2021-10-19 LAB — LIPID PANEL
Chol/HDL Ratio: 2.5 ratio (ref 0.0–4.4)
Cholesterol, Total: 168 mg/dL (ref 100–199)
HDL: 68 mg/dL (ref >39)
LDL Chol Calc (NIH): 88 mg/dL (ref 0–99)
Triglycerides: 64 mg/dL (ref 0–149)
VLDL Cholesterol Cal: 12 mg/dL (ref 5–40)

## 2021-10-19 LAB — FERRITIN: Ferritin: 466 ng/mL — ABNORMAL HIGH (ref 15–150)

## 2021-10-24 ENCOUNTER — Telehealth: Payer: Self-pay | Admitting: Student

## 2021-10-25 NOTE — Telephone Encounter (Signed)
Error

## 2021-10-26 ENCOUNTER — Encounter: Payer: Self-pay | Admitting: Student

## 2021-10-27 NOTE — Telephone Encounter (Signed)
Called patient to review result and follow up mychart message. Unable to reach patient and left a generic VM.

## 2022-03-07 ENCOUNTER — Encounter: Payer: Self-pay | Admitting: Student

## 2022-03-13 ENCOUNTER — Other Ambulatory Visit: Payer: Self-pay | Admitting: Family Medicine

## 2022-03-13 DIAGNOSIS — Z1231 Encounter for screening mammogram for malignant neoplasm of breast: Secondary | ICD-10-CM

## 2022-04-08 ENCOUNTER — Other Ambulatory Visit: Payer: Self-pay | Admitting: Student

## 2022-04-08 DIAGNOSIS — E785 Hyperlipidemia, unspecified: Secondary | ICD-10-CM

## 2022-04-25 NOTE — Patient Instructions (Incomplete)
Coughing.   Spirometry today shows normal ventilatory function.   have access to albuterol inhaler 2 puffs every 4-6 hours as needed for cough/wheeze/shortness of breath/chest tightness.  May use 15-20 minutes prior to activity.   Monitor frequency of use and if it helps with cough cessation Refill sent   Allergic rhinitis with nonallergic component Skin testing 2018 revealed mild/borderline reactivity to grass, mold, and cockroach antigen.  Continue allergen avoidance measures Continue nasal Atrovent 2 sprays each nostril 1-2 times a day (can use up to 3-4 times a day if needed for nasal drainage control) Continue Zyrtec 10 mg once a day as needed for runny nose Start Flonase (fluticasone) nasal spray 1-2 sprays in each nostril once a day as needed for stuffy nose. In the right nostril, point the applicator out toward the right ear. In the left nostril, point the applicator out toward the left ear Nasal saline lavage (NeilMed) has been recommended as needed and prior to medicated nasal sprays along with instructions for proper administration. Consider adding in Singulair if above regimens are not effective enough in controlling symptoms  Toxic effect of venom of ants Avoid ant bites. In case of an allergic reaction, give Benadryl 4 teaspoonfuls every 4 hours, and if life-threatening symptoms occur, inject with EpiPen 0.3 mg. Refill of EpiPen sent  Follow-up in 6 months or sooner if needed

## 2022-04-26 ENCOUNTER — Ambulatory Visit: Payer: BC Managed Care – PPO | Admitting: Family

## 2022-04-26 ENCOUNTER — Encounter: Payer: Self-pay | Admitting: Family

## 2022-04-26 VITALS — BP 118/74 | HR 84 | Temp 97.6°F | Resp 12 | Ht 64.57 in | Wt 327.0 lb

## 2022-04-26 DIAGNOSIS — R058 Other specified cough: Secondary | ICD-10-CM

## 2022-04-26 DIAGNOSIS — T63421D Toxic effect of venom of ants, accidental (unintentional), subsequent encounter: Secondary | ICD-10-CM

## 2022-04-26 DIAGNOSIS — J3089 Other allergic rhinitis: Secondary | ICD-10-CM

## 2022-04-26 MED ORDER — IPRATROPIUM BROMIDE 0.06 % NA SOLN: NASAL | 5 refills | Status: DC

## 2022-04-26 MED ORDER — EPINEPHRINE 0.3 MG/0.3ML IJ SOAJ: 0.3000 mg | INTRAMUSCULAR | 1 refills | Status: DC | PRN

## 2022-04-26 MED ORDER — FLUTICASONE PROPIONATE 50 MCG/ACT NA SUSP: NASAL | 5 refills | Status: AC

## 2022-04-26 MED ORDER — ALBUTEROL SULFATE HFA 108 (90 BASE) MCG/ACT IN AERS: 2.0000 | INHALATION_SPRAY | RESPIRATORY_TRACT | 1 refills | Status: AC | PRN

## 2022-04-26 NOTE — Progress Notes (Signed)
[place] [id number] Dept: [phone]  FOLLOW UP NOTE  Patient ID: [name], female    DOB: 11-09-1965  Age: 57 y.o. MRN: [id number] Date of Office Visit: 04/26/2022  Assessment  Chief Complaint: Allergic Rhinitis  (Had facial pressure last Tuesday and wednesday)  HPI Idona Alessandro is a 57 year old female who presents today for follow-up of upper airway cough syndrome, allergic rhinitis with nonallergic component, and toxic effect of venom of ants.  She was last seen on August 08, 2020 by Dr. Nelva Bush.  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  Upper airway cough syndrome: She reports coughing only when she gets choked with mucus.  She denies wheezing, tightness in chest, shortness of breath, and nocturnal awakenings due to breathing problems.  Since her last office visit she did go to urgent care on January 04, 2021 and was diagnosed with a viral upper respiratory infection with cough and wheezing.  She was given a prescription for prednisone and Tessalon Perles.  She reports the albuterol inhaler she was prescribed by Dr. Nelva Bush is expired.  She has never been diagnosed with asthma.  Allergic rhinitis with nonallergic component: She reports last week (on Tuesday and Wednesday) her allergies started flaring.  Her allergies are now getting better, but she still has some sinus pressure and nasal congestion.  She reports that she had sinus pressure and mucus in her nose and her throat.  What she is blowing out of her nose is clear in color.  She also had nasal congestion and postnasal drip. She denies fever and chills. She has not been treated for any sinus infections since we last saw her.  She is out of Atrovent nasal spray.  She has been using Zyrtec 10 mg in the morning and since the symptoms of started she has been using NyQuil at night.  Toxic effect of venom of ants: She is requesting a refill  of her EpiPen.  She reports that her original reaction with red ants  was that she developed hives all over her body and she was told by the ER that this was the most "terrible hives that they have ever seen."  She reports that she was given 2 injections in the buttocks and other medications.  Since her last office visit she has had 1 black ant bite that caused 1 whelp.  She now tries to wear closed toe shoes.   Drug Allergies:  Allergies  Allergen Reactions   Other     general anesthesia - upsets stomach    Penicillins Hives and Itching    Has patient had a PCN reaction causing immediate rash, facial/tongue/throat swelling, SOB or lightheadedness with hypotension: Yes Has patient had a PCN reaction causing severe rash involving mucus membranes or skin necrosis: Yes Has patient had a PCN reaction that required hospitalization: No Has patient had a PCN reaction occurring within the last 10 years: No If all of the above answers are "NO", then may proceed with Cephalosporin use.     Review of Systems: Review of Systems  Constitutional:  Negative for chills and fever.  HENT:         Reports sinus pressure, post nasal drip, clear rhinorrhea and nasal congestion that was worse last week (Tuesday and Wednesday)  Eyes:        Denies itchy watery eyes  Respiratory:  Positive for cough. Negative for  shortness of breath and wheezing.        Reports cough only if has mucous in her throat. Denies wheeze, tightness in chest, shortness of breath and nocturnal awakenings due to breathing problems  Cardiovascular:  Negative for chest pain and palpitations.  Gastrointestinal:        Denies heartburn and reflux symptoms  Genitourinary:  Negative for frequency.  Skin:  Negative for itching and rash.  Neurological:  Negative for headaches.  Endo/Heme/Allergies:  Positive for environmental allergies.     Physical Exam: BP 118/74 (BP Location: Right Arm, Patient Position: Sitting, Cuff Size: Large)    Pulse 84   Temp 97.6 F (36.4 C) (Temporal)   Resp 12   Ht 5' 4.57" (1.64 m)   Wt (!) 327 lb (148.3 kg)   SpO2 98%   BMI 55.15 kg/m    Physical Exam Constitutional:      Appearance: Normal appearance.  HENT:     Head: Normocephalic and atraumatic.     Comments: Pharynx normal. Eyes normal. Ears normal. Nose: bilateral lower turbinates mildly edematous with no drainage noted.    Right Ear: Tympanic membrane, ear canal and external ear normal.     Left Ear: Tympanic membrane, ear canal and external ear normal.     Mouth/Throat:     Mouth: Mucous membranes are moist.     Pharynx: Oropharynx is clear.  Eyes:     Conjunctiva/sclera: Conjunctivae normal.  Cardiovascular:     Rate and Rhythm: Normal rate and regular rhythm.     Heart sounds: Normal heart sounds.  Pulmonary:     Effort: Pulmonary effort is normal.     Breath sounds: Normal breath sounds.     Comments: Lungs clear to auscultation Musculoskeletal:     Cervical back: Neck supple.  Skin:    General: Skin is warm.  Neurological:     Mental Status: She is alert and oriented to person, place, and time.  Psychiatric:        Mood and Affect: Mood normal.        Behavior: Behavior normal.        Thought Content: Thought content normal.        Judgment: Judgment normal.     Diagnostics: FVC 2.02 L (71%), FEV1 1.72 L (76%).  Spirometry indicates normal spirometry  Assessment and Plan: 1. Allergic rhinitis with nonallergic component   2. Upper airway cough syndrome   3. Toxic effect of venom of ants, unintentional, subsequent encounter   4. Other cough     Meds ordered this encounter  Medications   ipratropium (ATROVENT) 0.06 % nasal spray    Sig: Place 2 sprays in each nostril 1-2 times a day as needed for drainage down throat/runny nose. Caution as this can be drying    Dispense:  15 mL    Refill:  5   albuterol (VENTOLIN HFA) 108 (90 Base) MCG/ACT inhaler    Sig: Inhale 2 puffs into the lungs every 4 (four)  hours as needed for wheezing or shortness of breath.    Dispense:  1 each    Refill:  1   fluticasone (FLONASE) 50 MCG/ACT nasal spray    Sig: 2 sprays per nostril once daily as needed for stuffy nose.    Dispense:  16 mL    Refill:  5   DISCONTD: EPINEPHrine 0.3 mg/0.3 mL IJ SOAJ injection    Sig: Inject 0.3 mg into the muscle as needed for anaphylaxis.  Dispense:  2 each    Refill:  1   EPINEPHrine 0.3 mg/0.3 mL IJ SOAJ injection    Sig: Inject 0.3 mg into the muscle as needed for anaphylaxis.    Dispense:  2 each    Refill:  1    Patient Instructions  Coughing.   Spirometry today shows normal ventilatory function.   have access to albuterol inhaler 2 puffs every 4-6 hours as needed for cough/wheeze/shortness of breath/chest tightness.  May use 15-20 minutes prior to activity.   Monitor frequency of use and if it helps with cough cessation Refill sent   Allergic rhinitis with nonallergic component Skin testing 2018 revealed mild/borderline reactivity to grass, mold, and cockroach antigen.  Continue allergen avoidance measures Continue nasal Atrovent 2 sprays each nostril 1-2 times a day (can use up to 3-4 times a day if needed for nasal drainage control) Continue Zyrtec 10 mg once a day as needed for runny nose Start Flonase (fluticasone) nasal spray 1-2 sprays in each nostril once a day as needed for stuffy nose. In the right nostril, point the applicator out toward the right ear. In the left nostril, point the applicator out toward the left ear Nasal saline lavage (NeilMed) has been recommended as needed and prior to medicated nasal sprays along with instructions for proper administration. Consider adding in Singulair if above regimens are not effective enough in controlling symptoms  Toxic effect of venom of ants Avoid ant bites. In case of an allergic reaction, give Benadryl 4 teaspoonfuls every 4 hours, and if life-threatening symptoms occur, inject with EpiPen 0.3  mg. Refill of EpiPen sent  Follow-up in 6 months or sooner if needed  Return in about 6 months (around 10/25/2022), or if symptoms worsen or fail to improve.    Thank you for the opportunity to care for this patient.  Please do not hesitate to contact me with questions.  Althea Charon, FNP [facility]

## 2022-05-02 ENCOUNTER — Ambulatory Visit
Admission: RE | Admit: 2022-05-02 | Discharge: 2022-05-02 | Disposition: A | Discharge status: Home / Self Care | Payer: BC Managed Care – PPO | Source: Ambulatory Visit | Attending: Family Medicine | Admitting: Family Medicine

## 2022-05-02 DIAGNOSIS — Z1231 Encounter for screening mammogram for malignant neoplasm of breast: Secondary | ICD-10-CM

## 2022-07-04 ENCOUNTER — Other Ambulatory Visit: Payer: Self-pay | Admitting: Student

## 2022-07-04 DIAGNOSIS — I10 Essential (primary) hypertension: Secondary | ICD-10-CM

## 2022-12-27 ENCOUNTER — Ambulatory Visit: Payer: BC Managed Care – PPO | Admitting: Family Medicine

## 2022-12-27 ENCOUNTER — Ambulatory Visit (HOSPITAL_COMMUNITY)
Admission: RE | Admit: 2022-12-27 | Discharge: 2022-12-27 | Disposition: A | Discharge status: Home / Self Care | Payer: BC Managed Care – PPO | Source: Ambulatory Visit | Attending: Family Medicine | Admitting: Family Medicine

## 2022-12-27 ENCOUNTER — Encounter: Payer: Self-pay | Admitting: Family Medicine

## 2022-12-27 VITALS — BP 122/82 | HR 86 | Ht 64.0 in | Wt 288.4 lb

## 2022-12-27 DIAGNOSIS — M79605 Pain in left leg: Secondary | ICD-10-CM | POA: Diagnosis present

## 2022-12-27 MED ORDER — EPINEPHRINE 0.3 MG/0.3ML IJ SOAJ: 0.3000 mg | INTRAMUSCULAR | 1 refills | Status: AC | PRN

## 2022-12-27 NOTE — Progress Notes (Cosign Needed Addendum)
    SUBJECTIVE:   CHIEF COMPLAINT / HPI:   L leg pain Patient notes that her left leg has felt sore and intermittently numb for the past few days, with a lifted vessel and small red spots appearing on the anterior left thigh. Her left leg is usually more swollen than the right one, and she notes that her left pinky toe is often numb. Her left big toe has newly felt numb and tingly today. She has had no recent injury and has continued to ambulate without issue. She recalls a leg ankle surgery about 4 years ago for a heel spur. No recent travel or long car rides. No alcohol or tobacco use. No CP, SOB, HA, or VC. No personal hx of cardiac issues or blood clots. No family history of blood disorders. Maternal grandfather with unspecific heart problem.    PERTINENT  PMH / PSH: HTN, HLD, L knee OA and Baker's cyst  OBJECTIVE:   BP 122/82   Pulse 86   Ht 5\' 4"  (1.626 m)   Wt 288 lb 6 oz (130.8 kg)   SpO2 100%   BMI 49.50 kg/m   General: Well-appearing. Resting comfortably in room. CV: Normal S1/S2. No extra heart sounds. Warm and well-perfused. Pulm: Breathing comfortably on room air. CTAB. No increased WOB. Abd: Soft, non-tender, non-distended. Ext: Largely symmetric lower extremities bilaterally, with mild swelling of L ankle more than R. Lower extremities warm and well-perfused bilaterally. Raised, bluish 1cm venous structure on anterior thigh, easily compressible without pain. Small 1-3mm red petechiae on anterior L thigh. Left leg diffusely sore to palpation. No tenderness to palpation of R leg. Some decreased sensation of L 1st and 5th toe. No posterior knee fluctuance or masses noted bilaterally. Distal pulses noted by preceptor bilaterally.  Skin:  Warm, dry. Psych: Pleasant and appropriate.       ASSESSMENT/PLAN:   L leg pain Concern for DVT given patient's unilateral LE symptoms. Other ddx include varicose veins, OA, intermittent claudication, PAD. Not likely infectious etiology  given lack of constitutional symptoms. - Left LE DVT ordered stat and scheduled for 3:30 PM this afternoon - CBC, BMP, Lipid panel ordered for further assessment  - Consider further PAD workup if DVT US is negative   Additionally, refilled patient's Epi pen.   Return to clinic in 1-2 weeks for follow up.   Lynn Quale, MD [facility]

## 2022-12-27 NOTE — Patient Instructions (Addendum)
Thank you for visiting clinic today - it is always our pleasure to care for you.  Today we discussed your left leg pain and blood vessel changes. We would like to get a venous ultrasound of your L leg to rule out blood clots. We are also checking a few labs today and will contact you with the results.   Your ultrasound is scheduled for today at 3:30PM at [facility] located at [address] [place], [place] [address]. Their number is [phone].   I have refilled your Epi pen to your pharmacy.   Please schedule an appointment in 1-2 weeks for follow up on your symptoms.   Reach out any time with any questions or concerns you may have - we are here for you!  Ivery Quale, MD [facility] [phone]

## 2022-12-28 LAB — LIPID PANEL
Chol/HDL Ratio: 3.4 ratio (ref 0.0–4.4)
Cholesterol, Total: 219 mg/dL — ABNORMAL HIGH (ref 100–199)
HDL: 65 mg/dL (ref >39)
LDL Chol Calc (NIH): 141 mg/dL — ABNORMAL HIGH (ref 0–99)
Triglycerides: 75 mg/dL (ref 0–149)
VLDL Cholesterol Cal: 13 mg/dL (ref 5–40)

## 2022-12-28 LAB — BASIC METABOLIC PANEL
BUN/Creatinine Ratio: 18 (ref 9–23)
BUN: 14 mg/dL (ref 6–24)
CO2: 27 mmol/L (ref 20–29)
Calcium: 9.7 mg/dL (ref 8.7–10.2)
Chloride: 100 mmol/L (ref 96–106)
Creatinine, Ser: 0.76 mg/dL (ref 0.57–1.00)
Glucose: 79 mg/dL (ref 70–99)
Potassium: 3.9 mmol/L (ref 3.5–5.2)
Sodium: 143 mmol/L (ref 134–144)
eGFR: 91 mL/min/{1.73_m2} (ref >59)

## 2022-12-28 LAB — CBC
Hematocrit: 41 % (ref 34.0–46.6)
Hemoglobin: 13 g/dL (ref 11.1–15.9)
MCH: 27.8 pg (ref 26.6–33.0)
MCHC: 31.7 g/dL (ref 31.5–35.7)
MCV: 88 fL (ref 79–97)
Platelets: 345 10*3/uL (ref 150–450)
RBC: 4.68 x10E6/uL (ref 3.77–5.28)
RDW: 13.3 % (ref 11.7–15.4)
WBC: 6.7 10*3/uL (ref 3.4–10.8)

## 2022-12-30 ENCOUNTER — Telehealth: Payer: Self-pay | Admitting: Family Medicine

## 2022-12-30 ENCOUNTER — Other Ambulatory Visit: Payer: Self-pay | Admitting: Family Medicine

## 2022-12-30 NOTE — Telephone Encounter (Signed)
Spoke with patient about recent lab findings, including unremarkable CBC and BMP. Discussed elevated total cholesterol and LDL and discussed increasing atorvastatin to 40mg  as previously prescribed for improved cholesterol control. Patient advised to return to former dose of 20mg  if she develops any side effects. Patient scheduled to follow up with PCP on 01/08/23.

## 2023-01-08 ENCOUNTER — Ambulatory Visit: Payer: BC Managed Care – PPO | Admitting: Student

## 2023-02-25 ENCOUNTER — Ambulatory Visit: Payer: BC Managed Care – PPO | Admitting: Student

## 2023-02-25 VITALS — BP 130/85 | HR 88 | Ht 64.0 in | Wt 269.6 lb

## 2023-02-25 DIAGNOSIS — M79605 Pain in left leg: Secondary | ICD-10-CM | POA: Diagnosis not present

## 2023-02-25 NOTE — Progress Notes (Signed)
    SUBJECTIVE:   CHIEF COMPLAINT / HPI: Lower extremity pain follow-up  57 year old female with hx of well controlled DM Presenting for follow up of LLE pain LLE Doppler US was negative Today patient report her LLE pain and rash has since resolved  No other concern today.  PERTINENT  PMH / PSH: Reviewed  OBJECTIVE:   BP 130/85   Pulse 88   Ht 5\' 4"  (1.626 m)   Wt 269 lb 9.6 oz (122.3 kg)   SpO2 100%   BMI 46.28 kg/m    Physical Exam General: Alert, well appearing, NAD Cardiovascular: RRR, No Murmurs, Normal S2/S2 Respiratory: CTAB, No wheezing or Rales Abdomen: No distension or tenderness Extremities: No edema on extremities, no lower extremity lesions   ASSESSMENT/PLAN:   Left leg pain Left leg pain and lesion has since resolved.  Reassuring that her Doppler ultrasound was negative.  Provided reassurance to patient.     Jerre Simon, MD [facility]

## 2023-02-25 NOTE — Assessment & Plan Note (Signed)
Left leg pain and lesion has since resolved.  Reassuring that her Doppler ultrasound was negative.  Provided reassurance to patient.

## 2023-02-26 ENCOUNTER — Encounter: Payer: Self-pay | Admitting: Student

## 2023-06-20 ENCOUNTER — Other Ambulatory Visit: Payer: Self-pay | Admitting: Family Medicine

## 2023-06-20 DIAGNOSIS — Z1231 Encounter for screening mammogram for malignant neoplasm of breast: Secondary | ICD-10-CM

## 2023-06-28 ENCOUNTER — Ambulatory Visit
Admission: RE | Admit: 2023-06-28 | Discharge: 2023-06-28 | Disposition: A | Discharge status: Home / Self Care | Payer: Self-pay | Source: Ambulatory Visit | Attending: Family Medicine | Admitting: Family Medicine

## 2023-06-28 DIAGNOSIS — Z1231 Encounter for screening mammogram for malignant neoplasm of breast: Secondary | ICD-10-CM

## 2023-10-01 ENCOUNTER — Other Ambulatory Visit: Payer: Self-pay | Admitting: Student

## 2023-10-01 DIAGNOSIS — I10 Essential (primary) hypertension: Secondary | ICD-10-CM

## 2023-10-04 ENCOUNTER — Ambulatory Visit (INDEPENDENT_AMBULATORY_CARE_PROVIDER_SITE_OTHER): Admitting: Student

## 2023-10-04 ENCOUNTER — Encounter: Payer: Self-pay | Admitting: Student

## 2023-10-04 DIAGNOSIS — E785 Hyperlipidemia, unspecified: Secondary | ICD-10-CM | POA: Diagnosis not present

## 2023-10-04 MED ORDER — ATORVASTATIN CALCIUM 40 MG PO TABS: 40.0000 mg | ORAL_TABLET | Freq: Every day | ORAL | 11 refills | Status: AC

## 2023-10-04 NOTE — Progress Notes (Signed)
    SUBJECTIVE:   CHIEF COMPLAINT / HPI:   [name] is a 58 year old female who presents for medication management and ear discomfort.  She has been off atorvastatin  for several months due to the medication expiring. She was previously on a 40 mg dose, which she had been cutting in half. She has not had a cholesterol check in over a year. She is currently taking hydrochlorothiazide  for hypertension, which she has refilled and picked up recently.  She experiences sinus issues with ear discomfort, described as a sensation of wax buildup without significant pain. Hearing remains unaffected.  PERTINENT  PMH / PSH: Reviewed   OBJECTIVE:   BP 117/78   Pulse 81   Ht 5' 4 (1.626 m)   Wt 218 lb 3.2 oz (99 kg)   SpO2 99%   BMI 37.45 kg/m    Physical Exam General: Alert, well appearing, NAD EAR: Normal right TM and ear canal, able to visualize the left TM due to wax buildup. Cardiovascular: RRR, No Murmurs, Normal S2/S2 Respiratory: CTAB, No wheezing or Rales Abdomen: No distension or tenderness   ASSESSMENT/PLAN:   HLD (hyperlipidemia) Hyperlipidemia management with atorvastatin . Off medication for several months. Decision to continue medication - Refill atorvastatin  at 40 mg dosage. - Consider cholesterol check next year if she remains on medication.   Cerumen impaction, left ear Cerumen impaction causing discomfort with wax obstructing view of the tympanic membrane. - Recommend over the counter debrox ear drop    Norleen April, MD [facility]

## 2023-10-04 NOTE — Patient Instructions (Signed)
 Pleasure to see you today.  I have refilled your Lipitor for your cholesterol.  I also recommend use of Debrox eardrop over-the-counter for your left ear.  Please continue to exercise and work on dieting as you continue to lose weight.

## 2023-10-04 NOTE — Assessment & Plan Note (Signed)
 Hyperlipidemia management with atorvastatin . Off medication for several months. Decision to continue medication - Refill atorvastatin  at 40 mg dosage. - Consider cholesterol check next year if she remains on medication.

## 2023-11-07 ENCOUNTER — Other Ambulatory Visit: Payer: Self-pay | Admitting: Urology

## 2023-11-07 DIAGNOSIS — D3001 Benign neoplasm of right kidney: Secondary | ICD-10-CM

## 2023-11-07 DIAGNOSIS — D49512 Neoplasm of unspecified behavior of left kidney: Secondary | ICD-10-CM

## 2023-11-08 ENCOUNTER — Encounter: Payer: Self-pay | Admitting: Urology

## 2023-12-13 ENCOUNTER — Telehealth: Payer: Self-pay

## 2023-12-13 ENCOUNTER — Ambulatory Visit (INDEPENDENT_AMBULATORY_CARE_PROVIDER_SITE_OTHER)

## 2023-12-13 DIAGNOSIS — Z23 Encounter for immunization: Secondary | ICD-10-CM | POA: Diagnosis not present

## 2023-12-13 NOTE — Telephone Encounter (Signed)
 Patient is requesting Covid Vaccine prescription to [facility].   Advised unsure if she meets CDC guidelines.  Will forward to PCP to send in.

## 2023-12-13 NOTE — Progress Notes (Signed)
 Patient presents to nurse clinic for flu vaccine.  Vaccine given without complication.  See admin for details.

## 2024-01-01 ENCOUNTER — Encounter: Payer: Self-pay | Admitting: Student

## 2024-01-14 ENCOUNTER — Encounter: Payer: Self-pay | Admitting: Student

## 2024-01-14 ENCOUNTER — Ambulatory Visit (INDEPENDENT_AMBULATORY_CARE_PROVIDER_SITE_OTHER): Admitting: Student

## 2024-01-14 VITALS — BP 131/88 | HR 75 | Ht 64.0 in | Wt 200.8 lb

## 2024-01-14 DIAGNOSIS — R7989 Other specified abnormal findings of blood chemistry: Secondary | ICD-10-CM

## 2024-01-14 DIAGNOSIS — M1712 Unilateral primary osteoarthritis, left knee: Secondary | ICD-10-CM | POA: Diagnosis not present

## 2024-01-14 DIAGNOSIS — Z23 Encounter for immunization: Secondary | ICD-10-CM | POA: Diagnosis not present

## 2024-01-14 MED ORDER — MELOXICAM 15 MG PO TABS: 15.0000 mg | ORAL_TABLET | Freq: Every day | ORAL | 0 refills | Status: AC

## 2024-01-14 NOTE — Assessment & Plan Note (Signed)
 Knee osteoarthritis Crepitus and pain, not bone-on-bone. Weight loss beneficial. Discussed treatments: anti-inflammatories, steroid injections. Meloxicam  preferred for fewer side effects. - Prescribed meloxicam . - Advised weight loss. - Discussed steroid injections if pain persists.

## 2024-01-14 NOTE — Progress Notes (Signed)
    SUBJECTIVE:   CHIEF COMPLAINT / HPI:   [name] is a 58 year old female with osteoarthritis who presents with knee pain and difficulty exercising.  She experiences knee pain, particularly in the morning, with a painful popping sensation. The pain is more pronounced in left knee and affects her ability to exercise, which she is attempting to do for weight loss.  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  PERTINENT  PMH / PSH: Reviewed   OBJECTIVE:   BP 131/88   Pulse 75   Ht 5' 4 (1.626 m)   Wt 200 lb 12.8 oz (91.1 kg)   SpO2 100%   BMI 34.47 kg/m    Physical Exam General: Alert, well appearing, NAD, morbidly obese Cardiovascular: RRR, No Murmurs, Normal S2/S2 Respiratory: CTAB, No wheezing or Rales Extremities: Normal right knee.  Left knee no laxity, mild tenderness to palpation over the medial aspect of the knee. Negative ant/post drawer test.  Notable crepitus with flexion and extension of the Left knee. No notable erythema or edema.    ASSESSMENT/PLAN:   Osteoarthritis of left knee Knee osteoarthritis Crepitus and pain, not bone-on-bone. Weight loss beneficial. Discussed treatments: anti-inflammatories, steroid injections. Meloxicam  preferred for fewer side effects. - Prescribed meloxicam . - Advised weight loss. - Discussed steroid injections if pain persists.   Abnormal iron studies Previous high ferritin, possibly due to chronic disease. - Ordered iron studies to monitor ferritin levels.  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 - COVID-vaccine administered today.  Norleen April, MD [facility]

## 2024-01-14 NOTE — Patient Instructions (Addendum)
 Pleasure to see you today.  The clicking in your knee is consistent with your left knee osteoarthritis.  As discussed I have sent in meloxicam  15 mg which you will take once daily.  This is an anti-inflammatory medication which should help with the pain and inflammation.  Continue working on weight loss as this will help relieve some of the pressure on your knee.  We ordered labs today to check your iron level and ferritin and give him your ferritin was elevated a year ago.  Also today we gave you your COVID-19 vaccine.

## 2024-01-15 LAB — IRON,TIBC AND FERRITIN PANEL
Ferritin: 625 ng/mL — ABNORMAL HIGH (ref 15–150)
Iron Saturation: 23 % (ref 15–55)
Iron: 54 ug/dL (ref 27–159)
Total Iron Binding Capacity: 239 ug/dL — ABNORMAL LOW (ref 250–450)
UIBC: 185 ug/dL (ref 131–425)

## 2024-01-16 ENCOUNTER — Telehealth: Payer: Self-pay

## 2024-01-16 NOTE — Telephone Encounter (Signed)
 Patient calls nurse line reporting she missed a call from PCP.   She reports she believes the call was in regards to recent lab work.  Will forward to PCP.

## 2024-01-19 ENCOUNTER — Encounter: Payer: Self-pay | Admitting: Student

## 2024-01-20 ENCOUNTER — Other Ambulatory Visit: Payer: Self-pay | Admitting: Student

## 2024-01-20 DIAGNOSIS — M1712 Unilateral primary osteoarthritis, left knee: Secondary | ICD-10-CM

## 2024-01-20 NOTE — Progress Notes (Signed)
 Placed referral to sports medicine for osteoarthritis.

## 2024-01-29 ENCOUNTER — Ambulatory Visit
Admission: RE | Admit: 2024-01-29 | Discharge: 2024-01-29 | Disposition: A | Discharge status: Home / Self Care | Source: Ambulatory Visit

## 2024-01-29 ENCOUNTER — Other Ambulatory Visit: Payer: Self-pay

## 2024-01-29 ENCOUNTER — Ambulatory Visit

## 2024-01-29 VITALS — BP 126/84 | Ht 64.5 in | Wt 190.0 lb

## 2024-01-29 DIAGNOSIS — G8929 Other chronic pain: Secondary | ICD-10-CM | POA: Diagnosis not present

## 2024-01-29 DIAGNOSIS — M25562 Pain in left knee: Secondary | ICD-10-CM

## 2024-01-29 MED ORDER — METHYLPREDNISOLONE ACETATE 40 MG/ML IJ SUSP
40.0000 mg | Freq: Once | INTRAMUSCULAR | Status: AC
  Administered 2024-01-29: 40 mg via INTRA_ARTICULAR

## 2024-01-29 NOTE — Patient Instructions (Addendum)

## 2024-01-29 NOTE — Progress Notes (Signed)
 PCP: Stoney Blizzard, DO  Subjective:   HPI: Patient is a 58 y.o. female here for left knee pain.  Patient states that she has had left knee pain for years but states that it is slightly getting worse.  She notices grinding whenever she fully extends and flexes her knee.  She can have pain with walking, stairs, getting up from a seated position.  She points to the lateral joint line of her left knee as to where her pain is located.  She denies any redness or swelling.  She denies any new injuries.  She did see her PCP who started her on Mobic .  She was feeling anxious about taking this medication due to the side effects that she read about online.  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 1 each 1   atorvastatin  (LIPITOR) 40 MG tablet Take 1 tablet (40 mg total) by mouth daily. 30 tablet 11   EPINEPHrine  0.3 mg/0.3 mL IJ SOAJ injection Inject 0.3 mg into the muscle as needed for anaphylaxis. 2 each 1   fluticasone  (FLONASE ) 50 MCG/ACT nasal spray 2 sprays per nostril once daily as needed for stuffy nose. 16 mL 5   hydrochlorothiazide  (HYDRODIURIL ) 25 MG tablet TAKE 1 TABLET BY MOUTH EVERY DAY 90 tablet 2   ipratropium (ATROVENT ) 0.06 % nasal spray 2 spray each nostril 1-2 times daily. (Patient taking differently: Place 2 sprays into both nostrils 2 (two) times daily as needed for rhinitis.) 15 mL 5   meloxicam  (MOBIC ) 15 MG tablet Take 1 tablet  (15 mg total) by mouth daily. 30 tablet 0   ZEPBOUND 2.5 MG/0.5ML Pen SMARTSIG:2.5 Milligram(s) SUB-Q Once a Week     No current facility-administered medications on file prior to visit.    BP 126/84   Ht 5' 4.5 (1.638 m)   Wt 190 lb (86.2 kg)   BMI 32.11 kg/m        Objective:   Physical Exam:  Gen: NAD, comfortable in exam room Left knee Inspection: No erythema or warmth, mild effusion Palpation: Tenderness palpation over the lateral joint line ROM: Full extension to 0 degrees, full flexion about 130 degrees with audible crepitus throughout Special Tests: Negative valgus and varus testing, negative anterior posterior drawer, McMurray's positive laterally Neuro: Sensation and strength is maintained in the bilateral lower extremities  Previous x-rays reviewed from 5 years ago with mild arthritis present of the left knee.  Limited ultrasound of the left knee: - No significant fluid in the suprapatellar pouch - Trochlear view does show bony irregularities/calcification - Osteophyte viewed at the lateral joint line - Meniscus bilaterally appears to be intact - Patella tendon intact  Assessment/Plan:   [name] is a 58 y.o. female who was seen today for the following: 1. Chronic pain of left knee (Primary) - US  LIMITED JOINT SPACE  STRUCTURES LOW LEFT; Future - methylPREDNISolone  acetate (DEPO-MEDROL ) injection 40 mg - methylPREDNISolone  acetate (DEPO-MEDROL ) injection 40 mg - DG Knee AP/LAT W/Sunrise Left; Future -Patient exam significant for lateral joint line tenderness as well as audible crepitus - Ultrasound showing arthritic change in multiple areas - No significant fluid - Patient is hesitant to start anti-inflammatories at this time - Discussed potential treatment option of steroid injection plus obtaining new x-rays - Patient was agreeable to this and tolerated the steroid injection well - We will call her with x-ray results and see how she is doing   - She may benefit from therapy, as well as adding anti-inflammatories  Procedure: Corticosteroid injection of the left knee Indications: pain 2/2 osteoarthritis Patient: [name] 07/20/1965 Procedure date: 01/29/2024  Risks, benefits, and alternatives were discussed with the patient.  Verbal and written, informed consent obtained.  Time-out conducted.  Noted no overlying erythema, induration, or other signs of local infection.  Skin prepped in a sterile fashion using ethyl alcohol.  Local anesthesia: Topical Ethyl chloride  Needle: 22 ga 1/12 Injection: 2/1 mixture of Lidocaine  1% w/out epi and methyldpred 80 mg/mL With sterile technique the joint space was injected using the anterolateral approach.   Bandage was placed.   The procedure was completed without difficulty. Patient tolerated well and no immediate complications.  Advised to monitor for and call if fevers/chills, erythema, warmth, induration, drainage, or persistent bleeding.  Post procedural instructions were discussed and printed material given.   Follow-up/Education:   No follow-ups on file.   May return sooner as needed and encouraged to call/e-mail for additional questions or  worsening symptoms in the interim.  Krystal Lowing, DO Sports Medicine Fellow 01/29/2024 11:32 AM

## 2024-02-01 ENCOUNTER — Ambulatory Visit
Admission: RE | Admit: 2024-02-01 | Discharge: 2024-02-01 | Disposition: A | Discharge status: Home / Self Care | Source: Ambulatory Visit | Attending: Urology | Admitting: Urology

## 2024-02-01 DIAGNOSIS — D3001 Benign neoplasm of right kidney: Secondary | ICD-10-CM

## 2024-02-01 DIAGNOSIS — D49512 Neoplasm of unspecified behavior of left kidney: Secondary | ICD-10-CM

## 2024-02-01 MED ORDER — GADOPICLENOL 0.5 MMOL/ML IV SOLN
9.0000 mL | Freq: Once | INTRAVENOUS | Status: AC | PRN
Start: 2024-02-01 — End: 2024-02-01
  Administered 2024-02-01: 9 mL via INTRAVENOUS

## 2024-03-20 ENCOUNTER — Ambulatory Visit: Payer: Self-pay
